# Patient Record
Sex: Female | Born: 1970
Health system: Southern US, Community
[De-identification: ages and names within clinical notes are randomized; demographics above are authoritative.]

## PROBLEM LIST (undated history)

## (undated) DIAGNOSIS — F410 Panic disorder [episodic paroxysmal anxiety] without agoraphobia: Secondary | ICD-10-CM

## (undated) DIAGNOSIS — R229 Localized swelling, mass and lump, unspecified: Secondary | ICD-10-CM

## (undated) DIAGNOSIS — K219 Gastro-esophageal reflux disease without esophagitis: Secondary | ICD-10-CM

## (undated) DIAGNOSIS — F419 Anxiety disorder, unspecified: Secondary | ICD-10-CM

## (undated) DIAGNOSIS — R55 Syncope and collapse: Secondary | ICD-10-CM

## (undated) DIAGNOSIS — E7800 Pure hypercholesterolemia, unspecified: Secondary | ICD-10-CM

## (undated) DIAGNOSIS — G4730 Sleep apnea, unspecified: Secondary | ICD-10-CM

## (undated) DIAGNOSIS — IMO0002 Reserved for concepts with insufficient information to code with codable children: Secondary | ICD-10-CM

## (undated) HISTORY — DX: Pure hypercholesterolemia, unspecified: E78.00

## (undated) HISTORY — PX: ABDOMINAL HYSTERECTOMY: SHX81

## (undated) HISTORY — DX: Sleep apnea, unspecified: G47.30

## (undated) HISTORY — DX: Anxiety disorder, unspecified: F41.9

## (undated) HISTORY — PX: WISDOM TOOTH EXTRACTION: SHX21

## (undated) HISTORY — PX: BIOPSY THYROID: PRO38

---
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
## 2008-09-29 ENCOUNTER — Emergency Department (HOSPITAL_BASED_OUTPATIENT_CLINIC_OR_DEPARTMENT_OTHER): Admission: EM | Admit: 2008-09-29 | Discharge: 2008-09-29 | Payer: Self-pay | Admitting: Emergency Medicine

## 2008-09-29 ENCOUNTER — Ambulatory Visit: Payer: Self-pay | Admitting: Diagnostic Radiology

## 2008-11-03 ENCOUNTER — Encounter: Payer: Self-pay | Admitting: Maternal & Fetal Medicine

## 2009-01-20 ENCOUNTER — Inpatient Hospital Stay (HOSPITAL_COMMUNITY): Admission: AD | Admit: 2009-01-20 | Discharge: 2009-01-20 | Payer: Self-pay | Admitting: Obstetrics

## 2009-02-05 ENCOUNTER — Encounter: Payer: Self-pay | Admitting: Obstetrics & Gynecology

## 2009-03-05 ENCOUNTER — Inpatient Hospital Stay (HOSPITAL_COMMUNITY): Admission: AD | Admit: 2009-03-05 | Discharge: 2009-03-05 | Payer: Self-pay | Admitting: Obstetrics & Gynecology

## 2009-03-25 ENCOUNTER — Inpatient Hospital Stay (HOSPITAL_COMMUNITY): Admission: AD | Admit: 2009-03-25 | Discharge: 2009-03-27 | Payer: Self-pay | Admitting: Obstetrics & Gynecology

## 2009-07-01 ENCOUNTER — Ambulatory Visit: Payer: Self-pay | Admitting: Obstetrics & Gynecology

## 2009-11-13 ENCOUNTER — Emergency Department (HOSPITAL_BASED_OUTPATIENT_CLINIC_OR_DEPARTMENT_OTHER): Admission: EM | Admit: 2009-11-13 | Discharge: 2009-11-13 | Payer: Self-pay | Admitting: Emergency Medicine

## 2010-03-16 LAB — RAPID STREP SCREEN (MED CTR MEBANE ONLY): Streptococcus, Group A Screen (Direct): NEGATIVE

## 2010-03-22 LAB — RH IMMUNE GLOBULIN WORKUP (NOT WOMEN'S HOSP)
ABO/RH(D): B NEG
Antibody Screen: NEGATIVE

## 2010-03-29 LAB — CBC
HCT: 33 % — ABNORMAL LOW (ref 36.0–46.0)
HCT: 36.6 % (ref 36.0–46.0)
Hemoglobin: 11 g/dL — ABNORMAL LOW (ref 12.0–15.0)
Hemoglobin: 12.3 g/dL (ref 12.0–15.0)
MCHC: 33.3 g/dL (ref 30.0–36.0)
MCHC: 33.7 g/dL (ref 30.0–36.0)
MCV: 90.9 fL (ref 78.0–100.0)
MCV: 93.1 fL (ref 78.0–100.0)
Platelets: 259 10*3/uL (ref 150–400)
Platelets: 297 10*3/uL (ref 150–400)
RBC: 3.55 MIL/uL — ABNORMAL LOW (ref 3.87–5.11)
RBC: 4.02 MIL/uL (ref 3.87–5.11)
RDW: 15.1 % (ref 11.5–15.5)
RDW: 15.4 % (ref 11.5–15.5)
WBC: 13.3 10*3/uL — ABNORMAL HIGH (ref 4.0–10.5)
WBC: 7.9 10*3/uL (ref 4.0–10.5)

## 2010-03-29 LAB — RH IMMUNE GLOB WKUP(>/=20WKS)(NOT WOMEN'S HOSP): Fetal Screen: NEGATIVE

## 2010-03-29 LAB — RPR: RPR Ser Ql: NONREACTIVE

## 2010-04-09 LAB — DIFFERENTIAL
Basophils Absolute: 0 10*3/uL (ref 0.0–0.1)
Basophils Relative: 0 % (ref 0–1)
Eosinophils Absolute: 0.2 10*3/uL (ref 0.0–0.7)
Eosinophils Relative: 3 % (ref 0–5)
Lymphocytes Relative: 24 % (ref 12–46)
Lymphs Abs: 1.7 10*3/uL (ref 0.7–4.0)
Monocytes Absolute: 0.7 10*3/uL (ref 0.1–1.0)
Monocytes Relative: 10 % (ref 3–12)
Neutro Abs: 4.3 10*3/uL (ref 1.7–7.7)
Neutrophils Relative %: 62 % (ref 43–77)

## 2010-04-09 LAB — BASIC METABOLIC PANEL
BUN: 5 mg/dL — ABNORMAL LOW (ref 6–23)
CO2: 26 mEq/L (ref 19–32)
Calcium: 10 mg/dL (ref 8.4–10.5)
Chloride: 102 mEq/L (ref 96–112)
Creatinine, Ser: 0.5 mg/dL (ref 0.4–1.2)
GFR calc Af Amer: >60 mL/min (ref >60)
GFR calc non Af Amer: >60 mL/min (ref >60)
Glucose, Bld: 82 mg/dL (ref 70–99)
Potassium: 3.3 mEq/L — ABNORMAL LOW (ref 3.5–5.1)
Sodium: 137 mEq/L (ref 135–145)

## 2010-04-09 LAB — CBC
HCT: 38.6 % (ref 36.0–46.0)
Hemoglobin: 12.9 g/dL (ref 12.0–15.0)
MCHC: 33.6 g/dL (ref 30.0–36.0)
MCV: 92 fL (ref 78.0–100.0)
Platelets: 324 10*3/uL (ref 150–400)
RBC: 4.19 MIL/uL (ref 3.87–5.11)
RDW: 13.7 % (ref 11.5–15.5)
WBC: 6.9 10*3/uL (ref 4.0–10.5)

## 2010-04-09 LAB — D-DIMER, QUANTITATIVE: D-Dimer, Quant: 0.27 ug/mL-FEU (ref 0.00–0.48)

## 2011-02-21 ENCOUNTER — Ambulatory Visit: Payer: Self-pay | Admitting: Obstetrics & Gynecology

## 2011-03-01 ENCOUNTER — Ambulatory Visit: Payer: Self-pay | Admitting: Obstetrics & Gynecology

## 2011-03-16 ENCOUNTER — Other Ambulatory Visit: Payer: Self-pay | Admitting: Obstetrics & Gynecology

## 2011-03-21 IMAGING — CR DG CHEST 2V
2 series · 2 of 2 positions shown · non-contrast
Comparison: None

CLINICAL DATA: Shortness of breath

CHEST - 2 VIEW

[w chest pa]
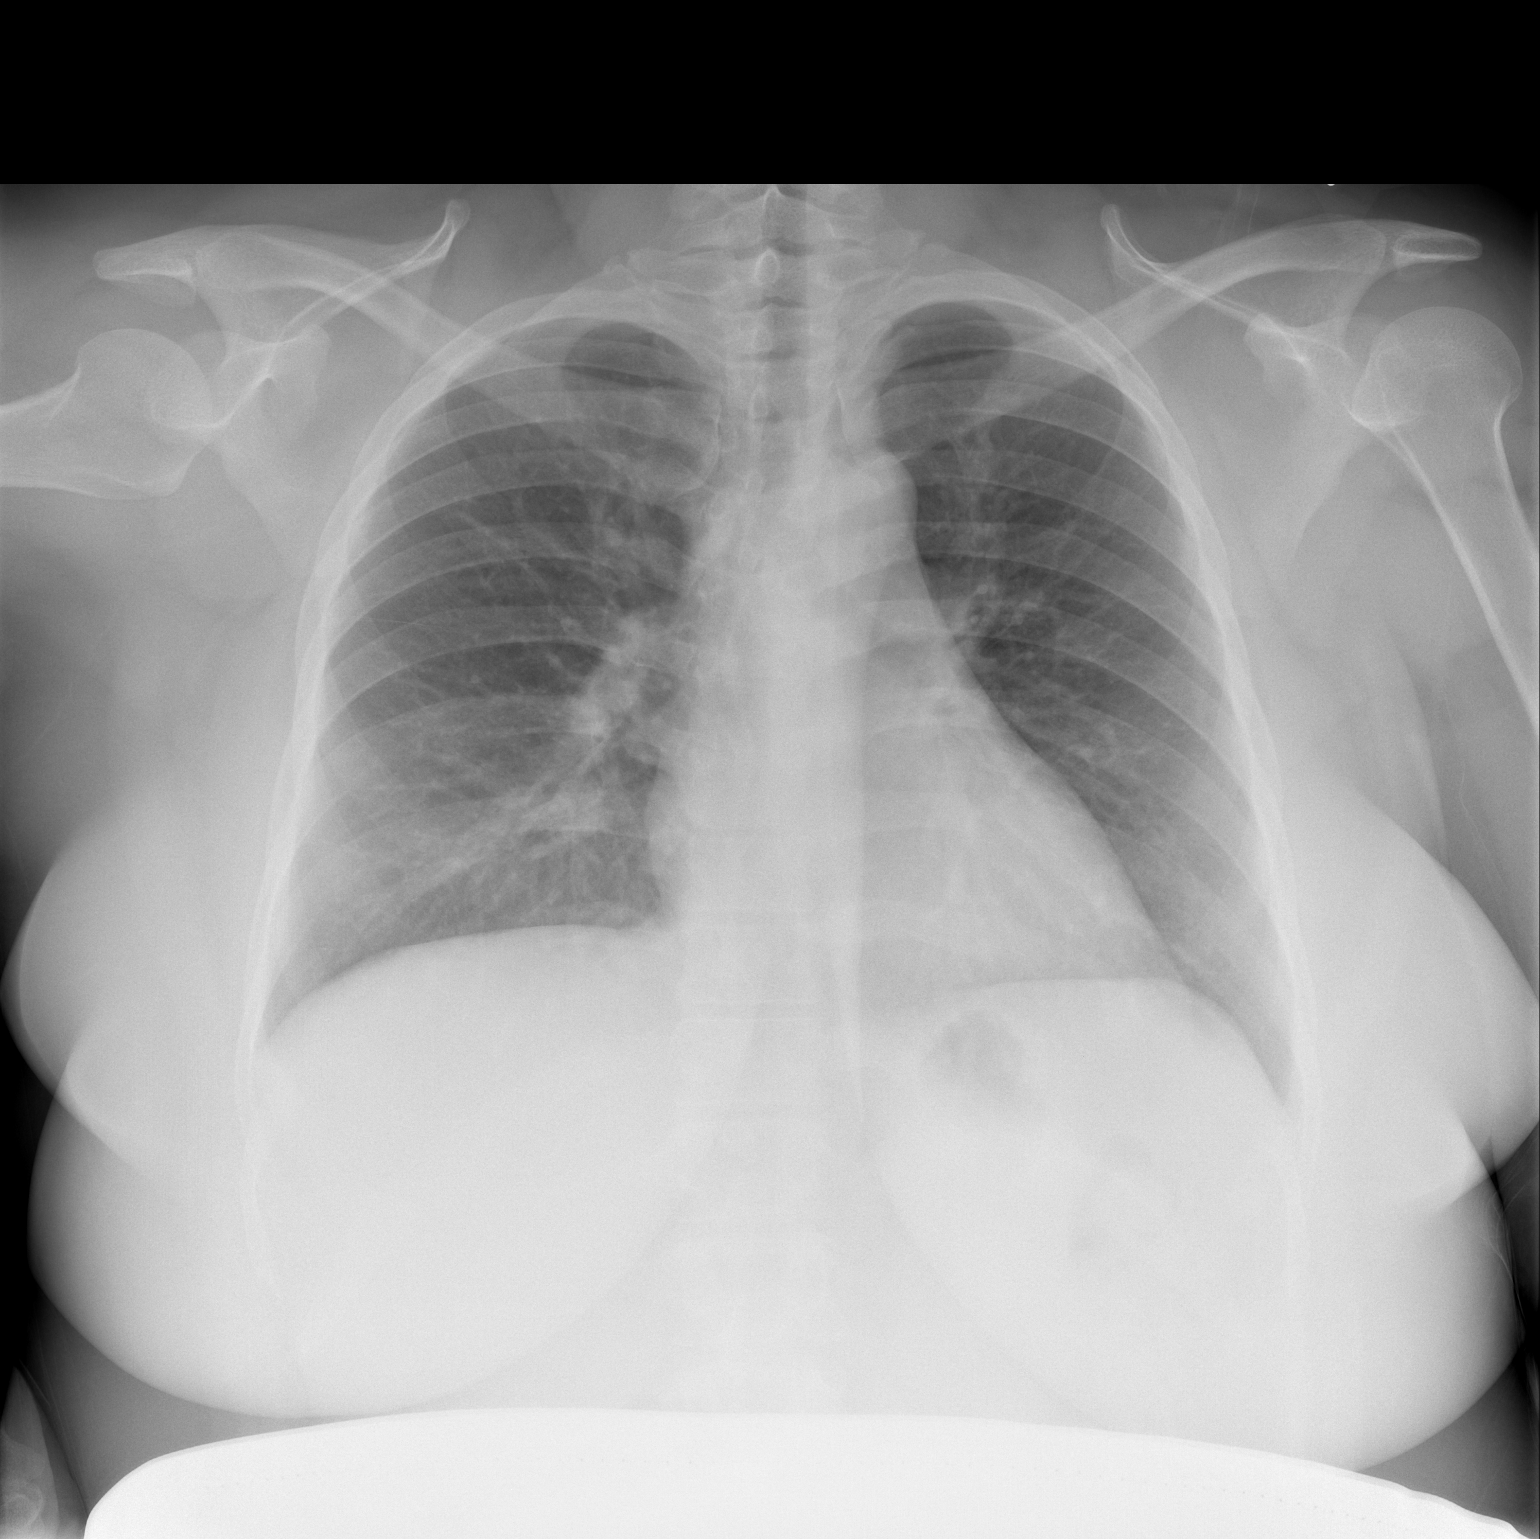

[w chest lat]
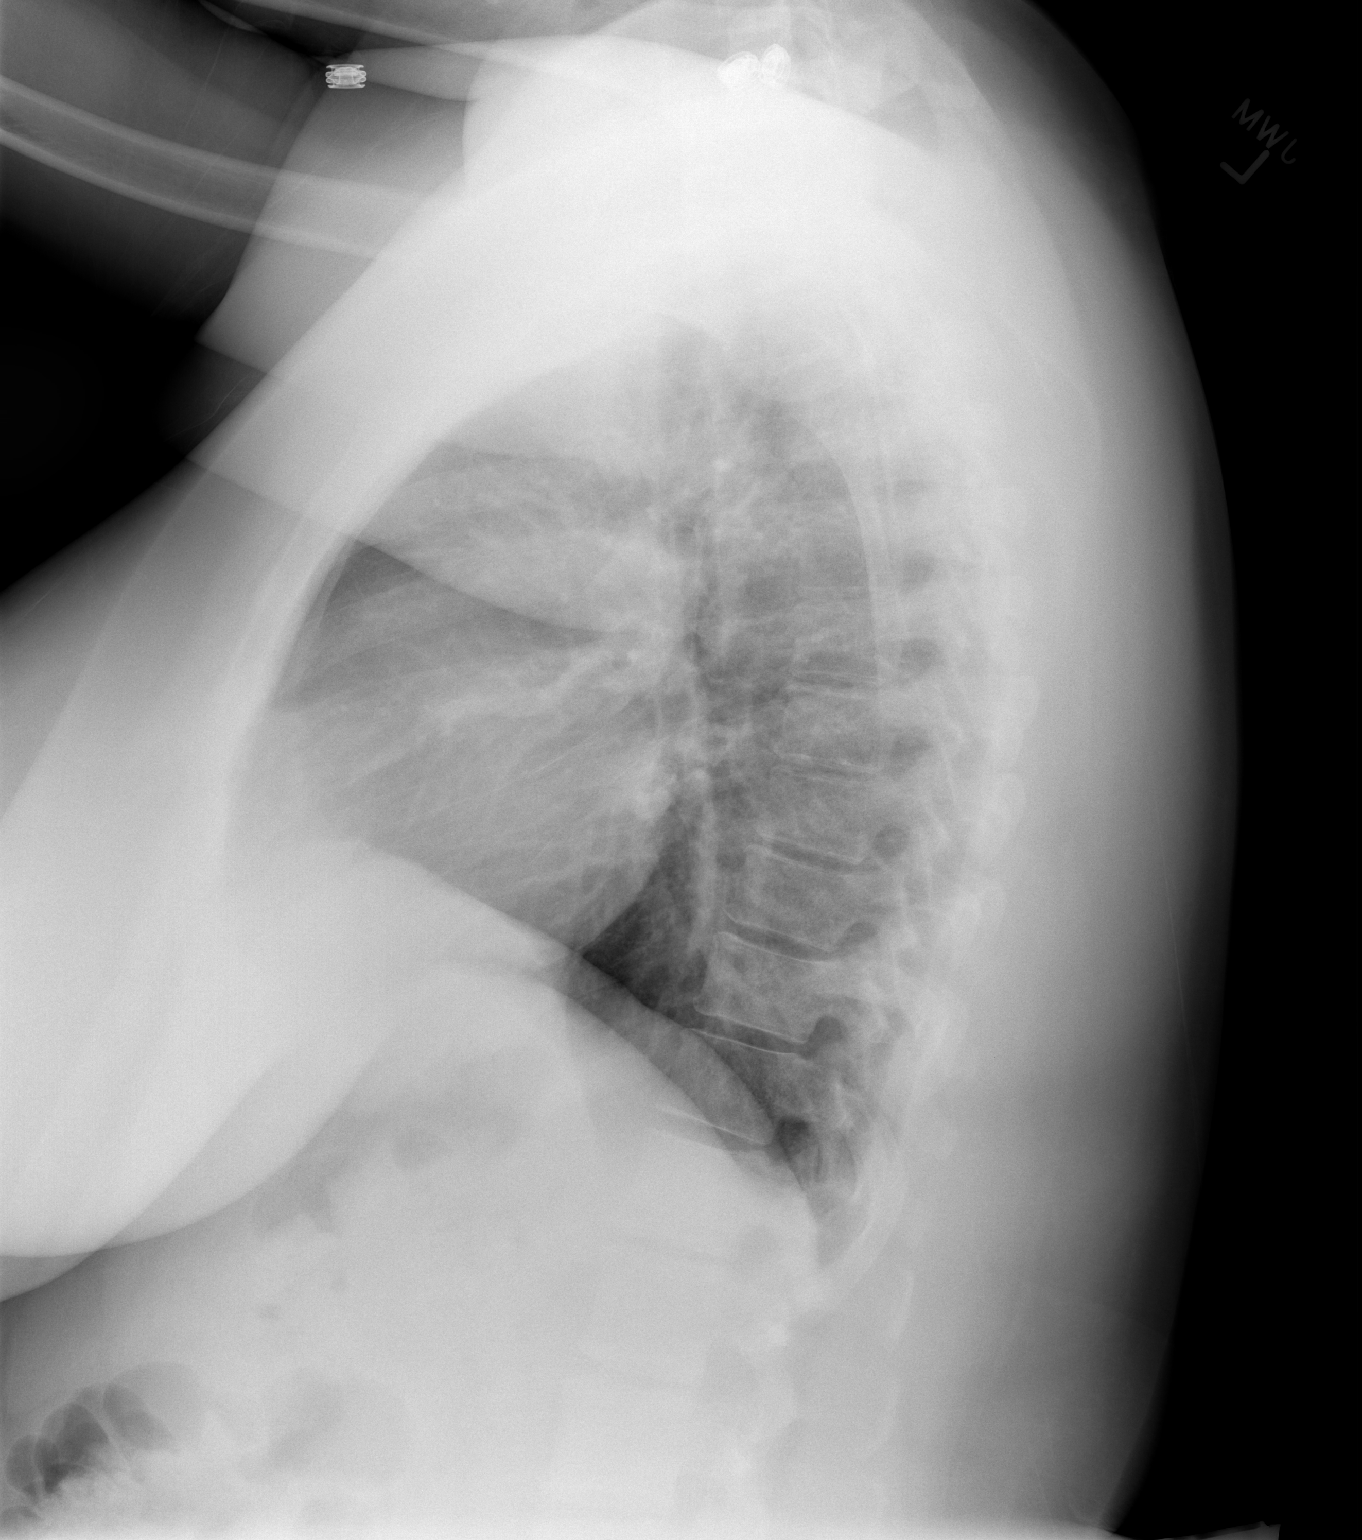

[2 of 2 positions shown; findings below may reference images not displayed]

FINDINGS: The heart size and mediastinal contours are within normal limits.
Both lungs are clear.  The visualized skeletal structures are
unremarkable.
IMPRESSION: No active cardiopulmonary disease.

## 2011-06-14 ENCOUNTER — Encounter (HOSPITAL_COMMUNITY): Payer: Self-pay | Admitting: Pharmacy Technician

## 2011-06-15 ENCOUNTER — Encounter (HOSPITAL_COMMUNITY)
Admission: RE | Admit: 2011-06-15 | Discharge: 2011-06-15 | Disposition: A | Discharge status: Home / Self Care | Source: Ambulatory Visit | Attending: Obstetrics & Gynecology | Admitting: Obstetrics & Gynecology

## 2011-06-15 ENCOUNTER — Other Ambulatory Visit: Payer: Self-pay | Admitting: Obstetrics & Gynecology

## 2011-06-15 ENCOUNTER — Encounter (HOSPITAL_COMMUNITY): Payer: Self-pay

## 2011-06-15 DIAGNOSIS — N9489 Other specified conditions associated with female genital organs and menstrual cycle: Secondary | ICD-10-CM | POA: Diagnosis present

## 2011-06-15 DIAGNOSIS — N939 Abnormal uterine and vaginal bleeding, unspecified: Secondary | ICD-10-CM | POA: Diagnosis present

## 2011-06-15 HISTORY — DX: Reserved for concepts with insufficient information to code with codable children: IMO0002

## 2011-06-15 HISTORY — DX: Localized swelling, mass and lump, unspecified: R22.9

## 2011-06-15 HISTORY — DX: Gastro-esophageal reflux disease without esophagitis: K21.9

## 2011-06-15 HISTORY — DX: Syncope and collapse: R55

## 2011-06-15 LAB — CBC
HCT: 37.8 % (ref 36.0–46.0)
Hemoglobin: 12.5 g/dL (ref 12.0–15.0)
MCH: 29.9 pg (ref 26.0–34.0)
MCHC: 33.1 g/dL (ref 30.0–36.0)
MCV: 90.4 fL (ref 78.0–100.0)
Platelets: 321 10*3/uL (ref 150–400)
RBC: 4.18 MIL/uL (ref 3.87–5.11)
RDW: 12.4 % (ref 11.5–15.5)
WBC: 5.6 10*3/uL (ref 4.0–10.5)

## 2011-06-15 LAB — HCG, SERUM, QUALITATIVE: Preg, Serum: NEGATIVE

## 2011-06-15 LAB — SURGICAL PCR SCREEN
MRSA, PCR: NEGATIVE
Staphylococcus aureus: NEGATIVE

## 2011-06-15 NOTE — Pre-Procedure Instructions (Signed)
CBC, SERUM PREGNANCY TEST, EKG WERE DONE TODAY AT [facility] PREOP. PREOP INSTRUCTIONS DISCUSSED WITH PT USING TEACH BACK METHOD.

## 2011-06-15 NOTE — Patient Instructions (Signed)
YOUR SURGERY IS SCHEDULED ON:  Friday 6/14  AT 7:30 AM  REPORT TO [facility] [place] [facility] AT:  5:30 AM      PHONE # FOR SHORT STAY IS [phone]  DO NOT EAT OR DRINK ANYTHING AFTER MIDNIGHT THE NIGHT BEFORE YOUR SURGERY.  YOU MAY BRUSH YOUR TEETH, RINSE OUT YOUR MOUTH--BUT NO WATER, NO FOOD, NO CHEWING GUM, NO MINTS, NO CANDIES, NO CHEWING TOBACCO.  PLEASE TAKE THE FOLLOWING MEDICATIONS THE AM OF YOUR SURGERY WITH A FEW SIPS OF WATER:  NEXIUM    IF YOU USE INHALERS--USE YOUR INHALERS THE AM OF YOUR SURGERY AND BRING INHALERS TO THE HOSPITAL -TAKE TO SURGERY.    IF YOU ARE DIABETIC:  DO NOT TAKE ANY DIABETIC MEDICATIONS THE AM OF YOUR SURGERY.  IF YOU TAKE INSULIN IN THE EVENINGS--PLEASE ONLY TAKE 1/2 NORMAL EVENING DOSE THE NIGHT BEFORE YOUR SURGERY.  NO INSULIN THE AM OF YOUR SURGERY.  IF YOU HAVE SLEEP APNEA AND USE CPAP OR BIPAP--PLEASE BRING THE MASK --NOT THE MACHINE-NOT THE TUBING   -JUST THE MASK. DO NOT BRING VALUABLES, MONEY, CREDIT CARDS.  CONTACT LENS, DENTURES / PARTIALS, GLASSES SHOULD NOT BE WORN TO SURGERY AND IN MOST CASES-HEARING AIDS WILL NEED TO BE REMOVED.  BRING YOUR GLASSES CASE, ANY EQUIPMENT NEEDED FOR YOUR CONTACT LENS. FOR PATIENTS ADMITTED TO THE HOSPITAL--CHECK OUT TIME THE DAY OF DISCHARGE IS 11:00 AM.  ALL INPATIENT ROOMS ARE PRIVATE - WITH BATHROOM, TELEPHONE, TELEVISION AND WIFI INTERNET. IF YOU ARE BEING DISCHARGED THE SAME DAY OF YOUR SURGERY--YOU CAN NOT DRIVE YOURSELF HOME--AND SHOULD NOT GO HOME ALONE BY TAXI OR BUS.  NO DRIVING OR OPERATING MACHINERY FOR 24 HOURS FOLLOWING ANESTHESIA / PAIN MEDICATIONS.                            SPECIAL INSTRUCTIONS:  CHLORHEXIDINE SOAP SHOWER (other brand names are Betasept and Hibiclens ) PLEASE SHOWER WITH CHLORHEXIDINE THE NIGHT BEFORE YOUR SURGERY AND THE AM OF YOUR SURGERY. DO NOT USE CHLORHEXIDINE ON YOUR FACE OR PRIVATE AREAS--YOU MAY USE YOUR NORMAL SOAP THOSE AREAS AND YOUR NORMAL SHAMPOO.  WOMEN SHOULD  AVOID SHAVING UNDER ARMS AND SHAVING LEGS 48 HOURS BEFORE USING CHLORHEXIDINE TO AVOID SKIN IRRITATION.  DO NOT USE IF ALLERGIC TO CHLORHEXIDINE.  PLEASE READ OVER ANY  FACT SHEETS THAT YOU WERE GIVEN: MRSA INFORMATION

## 2011-06-15 NOTE — H&P (Signed)
  Subjective:  Margaret Davis is a 41 y.o. gravida 3 para 2, female.  She presents for a TRH/LSO.  The patient presented with pelvic pain.  W/u included a pelvic U/S and MRI which demonstrated a 3 cm, solid left-sided adnexal mass felt to be ovarian in origin.  OVA1 testing was normal.  The patient gives a long h/o heavy bleeding with menses.  Pertinent Gyn History:  Menses flow is moderate Bleeding: see above Contraception: OCP (estrogen/progesterone) Last pap: normal   Patient Active Problem List   Diagnosis Date Noted  . Adnexal mass 06/15/2011  . Abnormal uterine bleeding 06/15/2011   Past Medical History  Diagnosis Date  . Syncope     PT STATES HER HEART AND BRAIN "MISFIRE" IF OVERLY HEATED OR UPSET--AND PT WOULD PASS OUT --NO EPISODES SINCE 2000  - NO MEDICATIONS--PT NO LONGER SEES CARDIOLOGIST  . GERD (gastroesophageal reflux disease)     OCCASIONAL  . Mass     RIGHT OVARY  --ABDOMINAL PAIN    Past Surgical History  Procedure Date  . Wisdom tooth extraction     No prescriptions prior to admission   Allergies  Allergen Reactions  . Latex Hives    CONTACT WITH LATEX CAUSES HIVES  . Other     CAN'T EAT ORANGES-BUT CAN DRINK OJ  . Phenobarbital     AS ACHILD--THOUGHT TO BE HAVING SEIZURES--HAD ALLERGIC REACTION TO PHENOBARBITAL.   LATER TOLD SHE DID NOT HAVE SEIZURES    History  Substance Use Topics  . Smoking status: Never Smoker   . Smokeless tobacco: Never Used  . Alcohol Use: Yes     RARELY    No family history on file.   Review of Systems Pertinent items are noted in HPI.    Objective:   Vital signs in last 24 hours: Temp:  [98 F (36.7 C)] 98 F (36.7 C) (06/12 1115) Pulse Rate:  [75] 75  (06/12 1115) Resp:  [16] 16  (06/12 1115) BP: (123)/(80) 123/80 mmHg (06/12 1115) SpO2:  [99 %] 99 % (06/12 1115) Weight:  [98.884 kg (218 lb)] 98.884 kg (218 lb) (06/12 1115)  General:   alert  Skin:   normal  HEENT:  PERRLA  Lungs:   clear to  auscultation bilaterally  Heart:   regular rate and rhythm, S1, S2 normal, no murmur, click, rub or gallop  Breasts:   normal without suspicious masses, skin or nipple changes or axillary nodes  Abdomen:  soft, non-tender; bowel sounds normal; no masses,  no organomegaly  Pelvis:  Vulva and vagina appear normal. Bimanual exam reveals normal uterus and adnexa.                                     Assessment/Plan: Left-sided, adnexal mass, ovarian origin.  Pelvic pain.  Abnormal uterine bleeding with heavy menses.   Will proceed with a TRH/LSO.  Procedure, risks, reasons, benefits and complications (including injury to bowel, bladder, major blood vessel, ureter, bleeding, possibility of transfusion, infection, or fistula formation) were reviewed in detail. Consent was signed and preop testing was ordered.  Instructions were reviewed, including NPO after midnight.

## 2011-06-17 ENCOUNTER — Ambulatory Visit (HOSPITAL_COMMUNITY): Admitting: Anesthesiology

## 2011-06-17 ENCOUNTER — Encounter (HOSPITAL_COMMUNITY): Payer: Self-pay | Admitting: Anesthesiology

## 2011-06-17 ENCOUNTER — Encounter (HOSPITAL_COMMUNITY)
Admission: RE | Disposition: A | Discharge status: Home / Self Care | Payer: Self-pay | Source: Ambulatory Visit | Attending: Obstetrics & Gynecology

## 2011-06-17 ENCOUNTER — Ambulatory Visit (HOSPITAL_COMMUNITY)
Admission: RE | Admit: 2011-06-17 | Discharge: 2011-06-18 | Disposition: A | Discharge status: Home / Self Care | Source: Ambulatory Visit | Attending: Obstetrics & Gynecology | Admitting: Obstetrics & Gynecology

## 2011-06-17 ENCOUNTER — Encounter (HOSPITAL_COMMUNITY): Payer: Self-pay | Admitting: *Deleted

## 2011-06-17 DIAGNOSIS — N9489 Other specified conditions associated with female genital organs and menstrual cycle: Secondary | ICD-10-CM | POA: Diagnosis present

## 2011-06-17 DIAGNOSIS — Z01810 Encounter for preprocedural cardiovascular examination: Secondary | ICD-10-CM | POA: Insufficient documentation

## 2011-06-17 DIAGNOSIS — N838 Other noninflammatory disorders of ovary, fallopian tube and broad ligament: Secondary | ICD-10-CM | POA: Insufficient documentation

## 2011-06-17 DIAGNOSIS — N939 Abnormal uterine and vaginal bleeding, unspecified: Secondary | ICD-10-CM | POA: Diagnosis present

## 2011-06-17 DIAGNOSIS — N926 Irregular menstruation, unspecified: Secondary | ICD-10-CM | POA: Insufficient documentation

## 2011-06-17 DIAGNOSIS — Z01812 Encounter for preprocedural laboratory examination: Secondary | ICD-10-CM | POA: Insufficient documentation

## 2011-06-17 DIAGNOSIS — D259 Leiomyoma of uterus, unspecified: Secondary | ICD-10-CM | POA: Diagnosis present

## 2011-06-17 SURGERY — ROBOTIC ASSISTED TOTAL HYSTERECTOMY
Anesthesia: General | Wound class: Clean Contaminated

## 2011-06-17 MED ORDER — LACTATED RINGERS IV SOLN
INTRAVENOUS | Status: DC | PRN
  Administered 2011-06-17 (×2): via INTRAVENOUS

## 2011-06-17 MED ORDER — KETOROLAC TROMETHAMINE 30 MG/ML IJ SOLN: 30.0000 mg | Freq: Four times a day (QID) | INTRAMUSCULAR | Status: AC

## 2011-06-17 MED ORDER — ONDANSETRON HCL 4 MG PO TABS: 4.0000 mg | ORAL_TABLET | Freq: Four times a day (QID) | ORAL | Status: DC | PRN

## 2011-06-17 MED ORDER — LACTATED RINGERS IV SOLN
INTRAVENOUS | Status: DC
  Administered 2011-06-17: 11:00:00 via INTRAVENOUS

## 2011-06-17 MED ORDER — ALUM & MAG HYDROXIDE-SIMETH 200-200-20 MG/5ML PO SUSP
30.0000 mL | Freq: Four times a day (QID) | ORAL | Status: DC | PRN
  Administered 2011-06-17 – 2011-06-18 (×2): 30 mL via ORAL
  Filled 2011-06-17 (×2): qty 30

## 2011-06-17 MED ORDER — BUPIVACAINE HCL (PF) 0.25 % IJ SOLN
INTRAMUSCULAR | Status: AC
  Filled 2011-06-17: qty 30

## 2011-06-17 MED ORDER — GLYCOPYRROLATE 0.2 MG/ML IJ SOLN
INTRAMUSCULAR | Status: DC | PRN
  Administered 2011-06-17: 0.4 mg via INTRAVENOUS

## 2011-06-17 MED ORDER — DEXAMETHASONE SODIUM PHOSPHATE 4 MG/ML IJ SOLN
INTRAMUSCULAR | Status: DC | PRN
  Administered 2011-06-17: 10 mg via INTRAVENOUS

## 2011-06-17 MED ORDER — MIDAZOLAM HCL 5 MG/5ML IJ SOLN
INTRAMUSCULAR | Status: DC | PRN
  Administered 2011-06-17: 2 mg via INTRAVENOUS

## 2011-06-17 MED ORDER — LIDOCAINE HCL (CARDIAC) 20 MG/ML IV SOLN
INTRAVENOUS | Status: DC | PRN
  Administered 2011-06-17: 50 mg via INTRAVENOUS

## 2011-06-17 MED ORDER — PROMETHAZINE HCL 25 MG/ML IJ SOLN: 6.2500 mg | INTRAMUSCULAR | Status: DC | PRN

## 2011-06-17 MED ORDER — ONDANSETRON HCL 4 MG/2ML IJ SOLN
INTRAMUSCULAR | Status: DC | PRN
  Administered 2011-06-17: 4 mg via INTRAVENOUS

## 2011-06-17 MED ORDER — NEOSTIGMINE METHYLSULFATE 1 MG/ML IJ SOLN
INTRAMUSCULAR | Status: DC | PRN
  Administered 2011-06-17: 3 mg via INTRAVENOUS

## 2011-06-17 MED ORDER — KCL IN DEXTROSE-NACL 20-5-0.45 MEQ/L-%-% IV SOLN
INTRAVENOUS | Status: DC
  Administered 2011-06-17 – 2011-06-18 (×2): via INTRAVENOUS
  Filled 2011-06-17 (×5): qty 1000

## 2011-06-17 MED ORDER — HYDROMORPHONE HCL PF 1 MG/ML IJ SOLN
INTRAMUSCULAR | Status: AC
  Filled 2011-06-17: qty 1

## 2011-06-17 MED ORDER — ROCURONIUM BROMIDE 100 MG/10ML IV SOLN
INTRAVENOUS | Status: DC | PRN
  Administered 2011-06-17: 50 mg via INTRAVENOUS
  Administered 2011-06-17: 10 mg via INTRAVENOUS

## 2011-06-17 MED ORDER — ZOLPIDEM TARTRATE 5 MG PO TABS: 5.0000 mg | ORAL_TABLET | Freq: Every evening | ORAL | Status: DC | PRN

## 2011-06-17 MED ORDER — BUPIVACAINE HCL (PF) 0.25 % IJ SOLN
INTRAMUSCULAR | Status: DC | PRN
  Administered 2011-06-17: 8 mL

## 2011-06-17 MED ORDER — OXYCODONE-ACETAMINOPHEN 5-325 MG PO TABS
1.0000 | ORAL_TABLET | ORAL | Status: DC | PRN
  Administered 2011-06-18: 1 via ORAL
  Administered 2011-06-18: 2 via ORAL
  Filled 2011-06-17: qty 2
  Filled 2011-06-17: qty 1

## 2011-06-17 MED ORDER — MEPERIDINE HCL 50 MG/ML IJ SOLN: 6.2500 mg | INTRAMUSCULAR | Status: DC | PRN

## 2011-06-17 MED ORDER — KETAMINE HCL 10 MG/ML IJ SOLN
INTRAMUSCULAR | Status: DC | PRN
  Administered 2011-06-17: 10 mg via INTRAVENOUS
  Administered 2011-06-17: 20 mg via INTRAVENOUS
  Administered 2011-06-17 (×2): 10 mg via INTRAVENOUS

## 2011-06-17 MED ORDER — MORPHINE SULFATE 2 MG/ML IJ SOLN
2.0000 mg | INTRAMUSCULAR | Status: DC | PRN
  Administered 2011-06-17 – 2011-06-18 (×3): 2 mg via INTRAVENOUS
  Filled 2011-06-17 (×2): qty 1
  Filled 2011-06-17: qty 2

## 2011-06-17 MED ORDER — ACETAMINOPHEN 10 MG/ML IV SOLN
INTRAVENOUS | Status: DC | PRN
  Administered 2011-06-17: 1000 mg via INTRAVENOUS

## 2011-06-17 MED ORDER — PHENYLEPHRINE HCL 10 MG/ML IJ SOLN
INTRAMUSCULAR | Status: DC | PRN
  Administered 2011-06-17 (×3): 40 ug via INTRAVENOUS

## 2011-06-17 MED ORDER — HYDROMORPHONE HCL PF 1 MG/ML IJ SOLN
0.2500 mg | INTRAMUSCULAR | Status: DC | PRN
  Administered 2011-06-17: 0.25 mg via INTRAVENOUS
  Administered 2011-06-17 (×2): 0.5 mg via INTRAVENOUS

## 2011-06-17 MED ORDER — KETOROLAC TROMETHAMINE 30 MG/ML IJ SOLN
INTRAMUSCULAR | Status: AC
  Filled 2011-06-17: qty 1

## 2011-06-17 MED ORDER — STERILE WATER FOR IRRIGATION IR SOLN
Status: DC | PRN
  Administered 2011-06-17: 3000 mL

## 2011-06-17 MED ORDER — KETOROLAC TROMETHAMINE 30 MG/ML IJ SOLN
30.0000 mg | Freq: Four times a day (QID) | INTRAMUSCULAR | Status: AC
  Administered 2011-06-17 (×3): 30 mg via INTRAVENOUS
  Filled 2011-06-17 (×2): qty 1

## 2011-06-17 MED ORDER — CEFAZOLIN SODIUM-DEXTROSE 2-3 GM-% IV SOLR
2.0000 g | INTRAVENOUS | Status: AC
  Administered 2011-06-17: 2 g via INTRAVENOUS

## 2011-06-17 MED ORDER — ONDANSETRON HCL 4 MG/2ML IJ SOLN: 4.0000 mg | Freq: Four times a day (QID) | INTRAMUSCULAR | Status: DC | PRN

## 2011-06-17 MED ORDER — ACETAMINOPHEN 10 MG/ML IV SOLN
INTRAVENOUS | Status: AC
  Filled 2011-06-17: qty 100

## 2011-06-17 MED ORDER — FENTANYL CITRATE 0.05 MG/ML IJ SOLN
INTRAMUSCULAR | Status: DC | PRN
  Administered 2011-06-17 (×10): 50 ug via INTRAVENOUS

## 2011-06-17 MED ORDER — PROPOFOL 10 MG/ML IV EMUL
INTRAVENOUS | Status: DC | PRN
  Administered 2011-06-17: 200 mg via INTRAVENOUS

## 2011-06-17 MED ORDER — METOCLOPRAMIDE HCL 5 MG/ML IJ SOLN
INTRAMUSCULAR | Status: DC | PRN
  Administered 2011-06-17: 10 mg via INTRAVENOUS

## 2011-06-17 MED ORDER — CEFAZOLIN SODIUM-DEXTROSE 2-3 GM-% IV SOLR
INTRAVENOUS | Status: AC
  Filled 2011-06-17: qty 50

## 2011-06-17 MED ORDER — LACTATED RINGERS IR SOLN
Status: DC | PRN
  Administered 2011-06-17: 1000 mL

## 2011-06-17 SURGICAL SUPPLY — 58 items
BAG URINE DRAINAGE (UROLOGICAL SUPPLIES) IMPLANT
BARRIER ADHS 3X4 INTERCEED (GAUZE/BANDAGES/DRESSINGS) IMPLANT
BENZOIN TINCTURE PRP APPL 2/3 (GAUZE/BANDAGES/DRESSINGS) ×2 IMPLANT
BLADELESS LONG 8MM (BLADE) IMPLANT
CABLE HIGH FREQUENCY MONO STRZ (ELECTRODE) ×2 IMPLANT
CATH FOLEY 3WAY  5CC 16FR (CATHETERS)
CATH FOLEY 3WAY 5CC 16FR (CATHETERS) IMPLANT
CATH FOLEY LATEX FREE 16FR (CATHETERS) ×2 IMPLANT
CLOTH BEACON ORANGE TIMEOUT ST (SAFETY) ×2 IMPLANT
CORDS BIPOLAR (ELECTRODE) ×2 IMPLANT
COVER MAYO STAND STRL (DRAPES) IMPLANT
COVER TIP SHEARS 8 DVNC (MISCELLANEOUS) ×1 IMPLANT
COVER TIP SHEARS 8MM DA VINCI (MISCELLANEOUS) ×2
DECANTER SPIKE VIAL GLASS SM (MISCELLANEOUS) IMPLANT
DRAPE CAMERA CLOSED 9X96 (DRAPES) IMPLANT
DRAPE LG THREE QUARTER DISP (DRAPES) ×4 IMPLANT
DRAPE TABLE BACK 44X90 PK DISP (DRAPES) ×4 IMPLANT
DRAPE WARM FLUID 44X44 (DRAPE) ×2 IMPLANT
ELECT REM PT RETURN 9FT ADLT (ELECTROSURGICAL) ×2
ELECTRODE REM PT RTRN 9FT ADLT (ELECTROSURGICAL) ×1 IMPLANT
FILTER SMOKE EVAC (FILTER) IMPLANT
GAUZE VASELINE 3X9 (GAUZE/BANDAGES/DRESSINGS) IMPLANT
GLOVE BIO SURGEON STRL SZ 6.5 (GLOVE) IMPLANT
GLOVE BIO SURGEON STRL SZ8 (GLOVE) IMPLANT
GLOVE BIOGEL PI IND STRL 7.0 (GLOVE) ×4 IMPLANT
GLOVE BIOGEL PI INDICATOR 7.0 (GLOVE) ×4
GLOVE ECLIPSE 6.5 STRL STRAW (GLOVE) IMPLANT
GOWN PREVENTION PLUS LG XLONG (DISPOSABLE) ×4 IMPLANT
GOWN STRL REIN XL XLG (GOWN DISPOSABLE) ×6 IMPLANT
KIT ACCESSORY DA VINCI DISP (KITS)
KIT ACCESSORY DVNC DISP (KITS) IMPLANT
MANIPULATOR UTERINE 4.5 ZUMI (MISCELLANEOUS) ×2 IMPLANT
NEEDLE INSUFFLATION 14GA 120MM (NEEDLE) IMPLANT
NS IRRIG 1000ML POUR BTL (IV SOLUTION) IMPLANT
OCCLUDER COLPOPNEUMO (BALLOONS) ×2 IMPLANT
PACK LAPAROSCOPY W LONG (CUSTOM PROCEDURE TRAY) ×2 IMPLANT
PACK ROBOTIC CUSTOM GYN (CUSTOM PROCEDURE TRAY) ×2 IMPLANT
PLUG CATH AND CAP STER (CATHETERS) IMPLANT
SET IRRIG TUBING LAPAROSCOPIC (IRRIGATION / IRRIGATOR) ×2 IMPLANT
SHEET LAVH (DRAPES) ×2 IMPLANT
SOLUTION ELECTROLUBE (MISCELLANEOUS) ×2 IMPLANT
SPONGE LAP 18X18 X RAY DECT (DISPOSABLE) IMPLANT
STRIP CLOSURE SKIN 1/4X4 (GAUZE/BANDAGES/DRESSINGS) ×2 IMPLANT
SYR 50ML LL SCALE MARK (SYRINGE) ×2 IMPLANT
SYSTEM CONVERTIBLE TROCAR (TROCAR) IMPLANT
TIP UTERINE 5.1X6CM LAV DISP (MISCELLANEOUS) IMPLANT
TIP UTERINE 6.7X10CM GRN DISP (MISCELLANEOUS) IMPLANT
TIP UTERINE 6.7X6CM WHT DISP (MISCELLANEOUS) IMPLANT
TIP UTERINE 6.7X8CM BLUE DISP (MISCELLANEOUS) IMPLANT
TOWEL OR 17X26 10 PK STRL BLUE (TOWEL DISPOSABLE) ×4 IMPLANT
TOWEL OR NON WOVEN STRL DISP B (DISPOSABLE) ×2 IMPLANT
TROCAR 12M 150ML BLUNT (TROCAR) ×2 IMPLANT
TROCAR DISP BLADELESS 8 DVNC (TROCAR) IMPLANT
TROCAR DISP BLADELESS 8MM (TROCAR)
TROCAR Z-THREAD 12X150 (TROCAR) IMPLANT
TROCAR Z-THREAD OPTICAL 5X100M (TROCAR) IMPLANT
TUBING FILTER THERMOFLATOR (ELECTROSURGICAL) IMPLANT
WARMER LAPAROSCOPE (MISCELLANEOUS) IMPLANT

## 2011-06-17 NOTE — Transfer of Care (Signed)
Immediate Anesthesia Transfer of Care Note  Patient: Margaret Davis  Procedure(s) Performed: Procedure(s) (LRB): ROBOTIC ASSISTED TOTAL HYSTERECTOMY (N/A)  Patient Location: PACU  Anesthesia Type: General  Level of Consciousness: awake and patient cooperative  Airway & Oxygen Therapy: Patient Spontanous Breathing and Patient connected to face mask oxygen  Post-op Assessment: Report given to PACU RN, Post -op Vital signs reviewed and stable and Patient moving all extremities  Post vital signs: Reviewed and stable  Complications: No apparent anesthesia complications

## 2011-06-17 NOTE — Anesthesia Preprocedure Evaluation (Addendum)
Anesthesia Evaluation  Patient identified by MRN, date of birth, ID band Patient awake    Reviewed: Allergy & Precautions, H&P , NPO status , Patient's Chart, lab work & pertinent test results  Airway Mallampati: I TM Distance: >3 FB Neck ROM: Full    Dental No notable dental hx.    Pulmonary neg pulmonary ROS,  breath sounds clear to auscultation  Pulmonary exam normal       Cardiovascular negative cardio ROS  Rhythm:Regular Rate:Normal     Neuro/Psych negative neurological ROS  negative psych ROS   GI/Hepatic negative GI ROS, Neg liver ROS, GERD-  Medicated and Controlled,  Endo/Other  negative endocrine ROSMorbid obesity  Renal/GU negative Renal ROS  negative genitourinary   Musculoskeletal negative musculoskeletal ROS (+)   Abdominal   Peds negative pediatric ROS (+)  Hematology negative hematology ROS (+)   Anesthesia Other Findings   Reproductive/Obstetrics negative OB ROS                           Anesthesia Physical Anesthesia Plan  ASA: II  Anesthesia Plan: General   Post-op Pain Management:    Induction: Intravenous  Airway Management Planned: Oral ETT  Additional Equipment:   Intra-op Plan:   Post-operative Plan: Extubation in OR  Informed Consent: I have reviewed the patients History and Physical, chart, labs and discussed the procedure including the risks, benefits and alternatives for the proposed anesthesia with the patient or authorized representative who has indicated his/her understanding and acceptance.   Dental advisory given  Plan Discussed with: CRNA  Anesthesia Plan Comments:         Anesthesia Quick Evaluation

## 2011-06-17 NOTE — Op Note (Signed)
Pre-operative Diagnosis: abnormal uterine bleeding and adnexal mass  Post-operative Diagnosis: abnormal uterine bleeding and fibroids  Operation: Robotic-assisted hysterectomy with bilateral salpingectomy  Surgeon: Roseanna Rainbow  Assistant: Coral Ceo, MD  Anesthesia: GET  Urine Output: per Anesthesiology  Findings: Pedunculated myoma arising from posterior, lower, left uterine serosa--approximately 3 cm in diameter.  Normal ovaries, tubes.  Estimated Blood Loss:  less than 100 mL                 Total IV Fluids: per Anesthesiology         Specimens: PATHOLOGY               Complications:  None; patient tolerated the procedure well.         Disposition: PACU - hemodynamically stable.         Condition: Stable    Procedure Details  The patient was seen in the Holding Room. The risks, benefits, complications, treatment options, and expected outcomes were discussed with the patient.  The patient concurred with the proposed plan, giving informed consent.  The site of surgery properly noted/marked. The patient was identified as Margaret Davis and the procedure verified as a Robotic-assisted hysterectomy with LSO. A Time Out was held and the above information confirmed.  After induction of anesthesia, the patient was draped and prepped in the usual sterile manner. Pt was placed in supine position after anesthesia and draped and prepped in the usual sterile manner. The abdominal drape was placed after the CholoraPrep had been allowed to dry for 3 minutes.  Her arms were tucked to her side with all appropriate precautions.  The shoulder blocks were placed in the usual fashion.  The patient was placed in the semi-lithotomy position in [place] stirrups.  The perineum was prepped with Betadine.  Foley catheter was placed.  A sterile speculum was placed in the vagina.  The cervix was grasped with a single-tooth tenaculum and dilated with Shawnie Pons dilators.  The ZUMI uterine  manipulator with a medium colpotomizer ring was placed without difficulty.  A pneum occluder balloon was placed over the manipulator.  A second time-out was performed.  OG tube placement was confirmed and to suction.  Approximately 2 cm below the costal margin, in the midclavicular line the skin was anesthestized with 0.25% Marcaine.  A 5 mm incision was made and using a 5 mm Optiview, a 5 mm trocar was placed under direct vision.  The patient's abdomen was insufflated with CO2 gas.  At this point and all points during the procedure, the patient's intra-abdominal pressure did not exceed 15 mmHg.  A 10-12 camera port was place 24 cm above the pubic symphysis.  Bilateral 8 mm ports were place 10 cm and 15 degrees inferior.  All ports were placed under direct visualization.  The 5 mm port was removed.  The incision was extended to accommodate a 10 mm trocar.  A 10 mm trocar and sleeve were advanced under direct visualization.   The robot was docked in the usual fashion.  The round ligament on the right was transected with monopolar cautery.  The anterior and posterior leaves of the broad ligament were opened.  The ureter was identified.  A window was made between the infundibulopelvic ligament and the ureter.  The utero-ovarian ligament/fallopian tube was coagulated with bipolar cautery and transected.  The uterine vessels were skeletonized to the level of the Koh ring.  A C-loop was created in the usual fashion.  A similar procedure was  performed on the patient's left side. The round ligament on the left was transected with monopolar cautery.  The anterior and posterior leaves of the broad ligament were opened.  The ureter was identified.  A window was made between the infundibulopelvic ligament and the ureter.  The  utero-ovarian ligament/fallopian tube was coagulated with bipolar cautery and transected.  The uterine vessels were skeletonized to the level of the Koh ring.  A C-loop was created in the usual fashion.    The bladder flap was completed.  At this time, the pneum occluder balloon was insufflated and a colpotomy was performed.  The uterus and cervix  were delivered through the vagina.  All pedicles were inspected under low intraabdominal pressures and noted to be hemostatic.  The vagina cuff was closed using 0-Vicryl on a CT-1 needle in a running fashion.  The mesosalpinx on the right was coagulated with bipolar cautery and transected.  The tube was removed through the assistant port.  The left fallopian tube was manipulated in a similar fashion.  The abdomen and pelvis were copiously irrigated.  The needle was removed without difficulty.  The instruments were then removed under direct visualization and the robotic ports removed.  The robot was undocked.  Deep, subcutaneous, figure-of-eight 0-Vicryl sutures on a UR-6 needle were placed in the 10-12 mm supraumbilical and infracostal incisions.  All skin incisions were closed in a subcuticular fashion using 3-0 Monocryl. Dermabond was applied.  The vagina was swabbed with minimal bleeding noted.   All instrument and needle counts were correct x  2.

## 2011-06-17 NOTE — Preoperative (Signed)
Beta Blockers   Reason not to administer Beta Blockers:Not Applicable, not on home BB 

## 2011-06-17 NOTE — H&P (Signed)
Please note an error in the diagnosis in the interval H&P note.  It should read left adnexal mass.  This is an error in the history section as well.

## 2011-06-17 NOTE — Anesthesia Postprocedure Evaluation (Signed)
  Anesthesia Post-op Note  Patient: Margaret Davis  Procedure(s) Performed: Procedure(s) (LRB): ROBOTIC ASSISTED TOTAL HYSTERECTOMY (N/A)  Patient Location: PACU  Anesthesia Type: General  Level of Consciousness: awake and alert   Airway and Oxygen Therapy: Patient Spontanous Breathing  Post-op Pain: mild  Post-op Assessment: Post-op Vital signs reviewed, Patient's Cardiovascular Status Stable, Respiratory Function Stable, Patent Airway and No signs of Nausea or vomiting  Post-op Vital Signs: stable  Complications: No apparent anesthesia complications

## 2011-06-17 NOTE — Interval H&P Note (Signed)
History and Physical Interval Note:  06/17/2011 7:17 AM  Margaret Davis  has presented today for surgery, with the diagnosis of RIGHT- ADNEXAL MASS/ Abnormal Uterine Bleeding   The various methods of treatment have been discussed with the patient and family. After consideration of risks, benefits and other options for treatment, the patient has consented to  Procedure(s) (LRB): ROBOTIC ASSISTED TOTAL HYSTERECTOMY (N/A) SALPINGO OOPHERECTOMY (N/A) as a surgical intervention .  The patients' history has been reviewed, patient examined, no change in status, stable for surgery.  I have reviewed the patients' chart and labs.  Questions were answered to the patient's satisfaction.     JACKSON-MOORE,[name]

## 2011-06-18 DIAGNOSIS — D259 Leiomyoma of uterus, unspecified: Secondary | ICD-10-CM | POA: Diagnosis present

## 2011-06-18 LAB — BASIC METABOLIC PANEL
BUN: 6 mg/dL (ref 6–23)
CO2: 23 mEq/L (ref 19–32)
Calcium: 9 mg/dL (ref 8.4–10.5)
Chloride: 105 mEq/L (ref 96–112)
Creatinine, Ser: 0.62 mg/dL (ref 0.50–1.10)
GFR calc Af Amer: >90 mL/min (ref >90)
GFR calc non Af Amer: >90 mL/min (ref >90)
Glucose, Bld: 127 mg/dL — ABNORMAL HIGH (ref 70–99)
Potassium: 3.7 mEq/L (ref 3.5–5.1)
Sodium: 137 mEq/L (ref 135–145)

## 2011-06-18 LAB — CBC
HCT: 37.6 % (ref 36.0–46.0)
Hemoglobin: 12.2 g/dL (ref 12.0–15.0)
MCH: 29.7 pg (ref 26.0–34.0)
MCHC: 32.4 g/dL (ref 30.0–36.0)
MCV: 91.5 fL (ref 78.0–100.0)
Platelets: 321 10*3/uL (ref 150–400)
RBC: 4.11 MIL/uL (ref 3.87–5.11)
RDW: 12.7 % (ref 11.5–15.5)
WBC: 11.1 10*3/uL — ABNORMAL HIGH (ref 4.0–10.5)

## 2011-06-18 MED ORDER — OXYCODONE-ACETAMINOPHEN 5-325 MG PO TABS: 1.0000 | ORAL_TABLET | Freq: Four times a day (QID) | ORAL | Status: AC | PRN

## 2011-06-18 MED ORDER — OXYCODONE-ACETAMINOPHEN 5-325 MG PO TABS: 1.0000 | ORAL_TABLET | ORAL | Status: DC | PRN

## 2011-06-18 NOTE — Discharge Summary (Signed)
Physician Discharge Summary  Patient ID: Margaret Davis MRN: [id number] DOB/AGE: November 25, 1970 41 y.o.  Admit date: 06/17/2011 Discharge date: 06/18/2011  Admission Diagnoses: Active Problems:  Adnexal mass  Abnormal uterine bleeding  Leiomyoma of uterus, unspecified  Discharge Diagnoses:  Active Problems:  Adnexal mass  Abnormal uterine bleeding  Leiomyoma of uterus, unspecified   Discharged Condition: good  Hospital Course: On 06/17/2011, the patient underwent the following: Procedure(s): ROBOTIC ASSISTED TOTAL HYSTERECTOMY.   The postoperative course was uneventful.  She was discharged to home on postoperative day 1 tolerating a regular diet.  Consults: None  Significant Diagnostic Studies: none  Treatments: surgery: see above  Discharge Exam: Blood pressure 124/85, pulse 74, temperature 98.5 F (36.9 C), temperature source Oral, resp. rate 16, height 5\' 5"  (1.651 m), weight 98.884 kg (218 lb), SpO2 98.00%. General appearance: alert GI: soft, non-tender; bowel sounds normal; no masses,  no organomegaly Extremities: extremities normal, atraumatic, no cyanosis or edema Incision/Wound: C/D/I  Disposition: Hone  Discharge Orders    Future Orders Please Complete By Expires   Diet - low sodium heart healthy      Increase activity slowly      May walk up steps      May shower / Bathe      Comments:   No tub baths for 6 weeks   Driving Restrictions      Comments:   No driving for 1 week   Lifting restrictions      Comments:   No lifting > 30 lbs for 6 weeks   Sexual Activity Restrictions      Comments:   No intercourse for  8 weeks   Discharge wound care:      Comments:   Keep clean and dry   Call MD for:  temperature >100.4      Call MD for:  persistant nausea and vomiting      Call MD for:  severe uncontrolled pain      Call MD for:  redness, tenderness, or signs of infection (pain, swelling, redness, odor or green/yellow discharge around incision site)        Call MD for:  persistant dizziness or light-headedness      Call MD for:  extreme fatigue        Medication List  As of 06/18/2011 12:51 PM   STOP taking these medications         LOESTRIN FE 1/20 1-20 MG-MCG tablet         TAKE these medications         cholecalciferol 1000 UNITS tablet   Commonly known as: VITAMIN D   Take 1,000 Units by mouth daily.      co-enzyme Q-10 30 MG capsule   Take 200 mg by mouth daily.      esomeprazole 40 MG capsule   Commonly known as: NEXIUM   Take 40 mg by mouth. ONCE A DAY  IF NEEDED      oxyCODONE-acetaminophen 5-325 MG per tablet   Commonly known as: PERCOCET   Take 1-2 tablets by mouth every 3 (three) hours as needed (moderate to severe pain (when tolerating fluids)).      simvastatin 20 MG tablet   Commonly known as: ZOCOR   Take 20 mg by mouth at bedtime.           Follow-up Information    Follow up with Antionette Char A, MD. Schedule an appointment as soon as possible for a visit in 2 weeks.  Contact information:   [address], [address] [place] [address] [phone]          Signed: Roseanna Rainbow 06/18/2011, 12:51 PM

## 2011-06-18 NOTE — Discharge Instructions (Signed)
Robotic Assisted  Hysterectomy °Care After °Refer to this sheet in the next few weeks. These instructions provide you with information on caring for yourself after your procedure. Your caregiver may also give you more specific instructions. Your treatment has been planned according to current medical practices, but problems sometimes occur. Call your caregiver if you have any problems or questions after your procedure. °HOME CARE INSTRUCTIONS °Healing will take time. You may have discomfort, tenderness, swelling, and bruising at the surgical site for a couple of weeks. This is normal and will get better as time goes on. °· Only take over-the-counter or prescription medicines for pain, discomfort, or fever as directed by your caregiver.  °· Do not take aspirin. It can cause bleeding.  °· Do not drive when taking pain medicine.  °· Follow your caregiver's advice regarding diet, exercise, lifting, driving, and general activities.  °· Resume your usual diet as directed and allowed.  °· Get plenty of rest and sleep.  °· Do not douche, use tampons, or have sexual intercourse for at least 6 weeks, or until your caregiver gives you permission.  °· Change your bandages (dressings) as directed by your caregiver.  °· Monitor your temperature and notify your caregiver of a fever.  °· Take showers instead of baths for 2 to 3 weeks.  °· Do not drink alcohol until your caregiver gives you permission.  °· If you develop constipation, you may take a mild laxative with your caregiver's permission. Bran foods may help with constipation problems. Drinking enough fluids to keep your urine clear or pale yellow may help as well.  °· Try to have someone home with you for 1 or 2 weeks to help around the house.  °· Keep all your follow-up appointments as directed by your caregiver.  °SEEK MEDICIAL CARE IF:  °· You have swelling, redness, or increasing pain in the wound.  °· You have pus coming from the wound.  °· You notice a bad smell  coming from the wound or bandage (dressing).  °· You have swelling, redness, or pain from the intravenous (IV) site.  °· Your wound breaks open.  °· You feel dizzy or lightheaded.  °· You have pain or bleeding when you urinate.  °· You have persistent diarrhea.  °· You have persistent nausea and vomiting.  °· You have abnormal vaginal discharge.  °· You have a rash.  °· You have any type of abnormal reaction or develop an allergy to your medicine.  °· You have poor pain control with your prescribed medicine.  °SEEK IMMEDIATE MEDICIAL CARE IF:  °· You have a fever.  °· You have severe abdominal pain.  °· You have chest pain.  °· You have shortness of breath.  °· You faint.  °· You have pain, swelling, or redness of your leg.  °· You have heavy vaginal bleeding with blood clots.  °MAKE SURE YOU: °· Understand these instructions.  °· Will watch your condition.  °· Will get help right away if you are not doing well or get worse.  °Document Released: 12/09/2010 Document Reviewed: 12/06/2010 °[employer]® Patient Information ©2012 [employer]. °

## 2011-07-04 HISTORY — PX: ABDOMINAL HYSTERECTOMY: SHX81

## 2011-10-11 ENCOUNTER — Ambulatory Visit: Payer: Self-pay | Admitting: Obstetrics & Gynecology

## 2012-06-14 ENCOUNTER — Encounter: Payer: Self-pay | Admitting: Obstetrics & Gynecology

## 2012-06-20 ENCOUNTER — Ambulatory Visit (INDEPENDENT_AMBULATORY_CARE_PROVIDER_SITE_OTHER): Admitting: Obstetrics & Gynecology

## 2012-06-20 VITALS — BP 126/79 | HR 82 | Temp 98.7°F | Wt 228.0 lb

## 2012-06-20 DIAGNOSIS — Z01419 Encounter for gynecological examination (general) (routine) without abnormal findings: Secondary | ICD-10-CM

## 2012-06-20 NOTE — Progress Notes (Signed)
Subjective:     Margaret Davis is a 42 y.o. female here for a routine exam.  Current complaints: here today for 1 year follow up to a hysterectomy.   Personal health questionnaire reviewed: yes.   Gynecologic History No LMP recorded. Contraception: status post hysterectomy  Obstetric History OB History   Grav Para Term Preterm Abortions TAB SAB Ect Mult Living                   The following portions of the patient's history were reviewed and updated as appropriate: allergies, current medications, past family history, past medical history, past social history, past surgical history and problem list.  Review of Systems Pertinent items are noted in HPI.    Objective:    General appearance: alert Breasts: normal appearance, no masses or tenderness Abdomen: soft, non-tender; bowel sounds normal; no masses,  no organomegaly Pelvic:  external genitalia normal, no adnexal masses or tenderness and vagina normal without discharge    Assessment:    Healthy female exam.    Plan:    Return prn

## 2012-06-22 ENCOUNTER — Encounter: Payer: Self-pay | Admitting: Obstetrics & Gynecology

## 2012-06-22 DIAGNOSIS — Z01419 Encounter for gynecological examination (general) (routine) without abnormal findings: Secondary | ICD-10-CM | POA: Insufficient documentation

## 2012-06-22 NOTE — Patient Instructions (Signed)

## 2012-09-06 ENCOUNTER — Emergency Department (HOSPITAL_BASED_OUTPATIENT_CLINIC_OR_DEPARTMENT_OTHER)

## 2012-09-06 ENCOUNTER — Emergency Department (HOSPITAL_BASED_OUTPATIENT_CLINIC_OR_DEPARTMENT_OTHER)
Admission: EM | Admit: 2012-09-06 | Discharge: 2012-09-06 | Disposition: A | Discharge status: Home / Self Care | Attending: Emergency Medicine | Admitting: Emergency Medicine

## 2012-09-06 ENCOUNTER — Encounter (HOSPITAL_BASED_OUTPATIENT_CLINIC_OR_DEPARTMENT_OTHER): Payer: Self-pay | Admitting: *Deleted

## 2012-09-06 DIAGNOSIS — R072 Precordial pain: Secondary | ICD-10-CM | POA: Insufficient documentation

## 2012-09-06 DIAGNOSIS — K219 Gastro-esophageal reflux disease without esophagitis: Secondary | ICD-10-CM | POA: Insufficient documentation

## 2012-09-06 DIAGNOSIS — Z91040 Latex allergy status: Secondary | ICD-10-CM | POA: Insufficient documentation

## 2012-09-06 DIAGNOSIS — R079 Chest pain, unspecified: Secondary | ICD-10-CM

## 2012-09-06 DIAGNOSIS — Z79899 Other long term (current) drug therapy: Secondary | ICD-10-CM | POA: Insufficient documentation

## 2012-09-06 DIAGNOSIS — Z8659 Personal history of other mental and behavioral disorders: Secondary | ICD-10-CM | POA: Insufficient documentation

## 2012-09-06 HISTORY — DX: Panic disorder (episodic paroxysmal anxiety): F41.0

## 2012-09-06 LAB — CBC WITH DIFFERENTIAL/PLATELET
Basophils Absolute: 0 10*3/uL (ref 0.0–0.1)
Basophils Relative: 0 % (ref 0–1)
Eosinophils Absolute: 0.2 10*3/uL (ref 0.0–0.7)
Eosinophils Relative: 4 % (ref 0–5)
HCT: 36.8 % (ref 36.0–46.0)
Hemoglobin: 12.3 g/dL (ref 12.0–15.0)
Lymphocytes Relative: 30 % (ref 12–46)
Lymphs Abs: 1.5 10*3/uL (ref 0.7–4.0)
MCH: 30.4 pg (ref 26.0–34.0)
MCHC: 33.4 g/dL (ref 30.0–36.0)
MCV: 90.9 fL (ref 78.0–100.0)
Monocytes Absolute: 0.5 10*3/uL (ref 0.1–1.0)
Monocytes Relative: 9 % (ref 3–12)
Neutro Abs: 2.9 10*3/uL (ref 1.7–7.7)
Neutrophils Relative %: 57 % (ref 43–77)
Platelets: 311 10*3/uL (ref 150–400)
RBC: 4.05 MIL/uL (ref 3.87–5.11)
RDW: 11.8 % (ref 11.5–15.5)
WBC: 5.2 10*3/uL (ref 4.0–10.5)

## 2012-09-06 LAB — COMPREHENSIVE METABOLIC PANEL
ALT: 19 U/L (ref 0–35)
AST: 18 U/L (ref 0–37)
Albumin: 3.9 g/dL (ref 3.5–5.2)
Alkaline Phosphatase: 77 U/L (ref 39–117)
BUN: 6 mg/dL (ref 6–23)
CO2: 28 mEq/L (ref 19–32)
Calcium: 9.6 mg/dL (ref 8.4–10.5)
Chloride: 105 mEq/L (ref 96–112)
Creatinine, Ser: 0.7 mg/dL (ref 0.50–1.10)
GFR calc Af Amer: >90 mL/min (ref >90)
GFR calc non Af Amer: >90 mL/min (ref >90)
Glucose, Bld: 99 mg/dL (ref 70–99)
Potassium: 3.2 mEq/L — ABNORMAL LOW (ref 3.5–5.1)
Sodium: 142 mEq/L (ref 135–145)
Total Bilirubin: 0.2 mg/dL — ABNORMAL LOW (ref 0.3–1.2)
Total Protein: 7.4 g/dL (ref 6.0–8.3)

## 2012-09-06 LAB — TROPONIN I
Troponin I: <0.3 ng/mL (ref <0.30)
Troponin I: <0.3 ng/mL (ref <0.30)

## 2012-09-06 MED ORDER — RANITIDINE HCL 300 MG PO TABS: 300.0000 mg | ORAL_TABLET | Freq: Every day | ORAL | Status: DC

## 2012-09-06 MED ORDER — ASPIRIN EC 81 MG PO TBEC: 81.0000 mg | DELAYED_RELEASE_TABLET | Freq: Every day | ORAL | Status: DC

## 2012-09-06 MED ORDER — ASPIRIN 81 MG PO CHEW
324.0000 mg | CHEWABLE_TABLET | Freq: Once | ORAL | Status: AC
  Administered 2012-09-06: 324 mg via ORAL
  Filled 2012-09-06: qty 4
  Filled 2012-09-06: qty 1

## 2012-09-06 NOTE — ED Provider Notes (Addendum)
CSN: [id number]     Arrival date & time 09/06/12  1105 History   First MD Initiated Contact with Patient 09/06/12 1206     Chief Complaint  Patient presents with  . Chest Pain   (Consider location/radiation/quality/duration/timing/severity/associated sxs/prior Treatment) HPI Comments: Pt comes in with cc of chest pain. Pt has hx of HL, GERD, no other med problems.  She reports 4 days of mid-sternal chest pain, described more as discomfort, and not pain, that she thought was GERD. She has taken GERD meds, with no relief. Now the discomfort is radiating to the left shoulder and neck. She has no n/v/f/c/cough/dib. No hx of PE, DVT. No family hx of premature CAD, marfan's syndrome.  Patient is a 42 y.o. female presenting with chest pain. The history is provided by the patient.  Chest Pain Associated symptoms: no abdominal pain, no headache, no nausea, no shortness of breath and not vomiting     Past Medical History  Diagnosis Date  . Syncope     PT STATES HER HEART AND BRAIN "MISFIRE" IF OVERLY HEATED OR UPSET--AND PT WOULD PASS OUT --NO EPISODES SINCE 2000  - NO MEDICATIONS--PT NO LONGER SEES CARDIOLOGIST  . GERD (gastroesophageal reflux disease)     OCCASIONAL  . Mass     RIGHT OVARY  --ABDOMINAL PAIN  . Panic attacks    Past Surgical History  Procedure Laterality Date  . Wisdom tooth extraction    . Abdominal hysterectomy     No family history on file. History  Substance Use Topics  . Smoking status: Never Smoker   . Smokeless tobacco: Never Used  . Alcohol Use: Yes     Comment: RARELY   OB History   Grav Para Term Preterm Abortions TAB SAB Ect Mult Living                 Review of Systems  Constitutional: Negative for activity change.  HENT: Negative for neck pain.   Respiratory: Negative for shortness of breath.   Cardiovascular: Positive for chest pain.  Gastrointestinal: Negative for nausea, vomiting and abdominal pain.  Genitourinary: Negative for dysuria.   Neurological: Negative for headaches.    Allergies  Latex; Other; and Phenobarbital  Home Medications   Current Outpatient Rx  Name  Route  Sig  Dispense  Refill  . phentermine 37.5 MG capsule   Oral   Take 37.5 mg by mouth every morning.         . cholecalciferol (VITAMIN D) 1000 UNITS tablet   Oral   Take 1,000 Units by mouth daily.         Marland Kitchen co-enzyme Q-10 30 MG capsule   Oral   Take 200 mg by mouth daily.          Marland Kitchen esomeprazole (NEXIUM) 40 MG capsule   Oral   Take 40 mg by mouth. ONCE A DAY  IF NEEDED         . loratadine (CLARITIN) 10 MG tablet   Oral   Take 10 mg by mouth daily.         . simvastatin (ZOCOR) 20 MG tablet   Oral   Take 20 mg by mouth at bedtime.          BP 138/85  Pulse 76  Temp(Src) 98.6 F (37 C)  Resp 20  Ht 5\' 5"  (1.651 m)  Wt 225 lb (102.059 kg)  BMI 37.44 kg/m2  SpO2 98%  LMP 06/04/2011 Physical Exam  Constitutional: She is  oriented to person, place, and time. She appears well-developed and well-nourished.  HENT:  Head: Normocephalic and atraumatic.  Eyes: EOM are normal. Pupils are equal, round, and reactive to light.  Neck: Neck supple.  Cardiovascular: Normal rate, regular rhythm and normal heart sounds.   No murmur heard. Pulmonary/Chest: Effort normal. No respiratory distress.  Abdominal: Soft. She exhibits no distension. There is no tenderness. There is no rebound and no guarding.  Neurological: She is alert and oriented to person, place, and time.  Skin: Skin is warm and dry.    ED Course  Procedures (including critical care time) Labs Review Labs Reviewed  COMPREHENSIVE METABOLIC PANEL - Abnormal; Notable for the following:    Potassium 3.2 (*)    Total Bilirubin 0.2 (*)    All other components within normal limits  CBC WITH DIFFERENTIAL  TROPONIN I  TROPONIN I   Imaging Review Dg Chest Port 1 View  09/06/2012   *RADIOLOGY REPORT*  Clinical Data: Chest pain, syncopal episode  PORTABLE CHEST - 1  VIEW  Comparison: 09/29/2008  Findings:  Examination is degraded secondary to portable technique and patient body habitus.  Grossly unchanged cardiac silhouette and mediastinal contours. Apparent veilling opacities overlying the bilateral costophrenic angles are favored to be secondary to overlying soft tissues.  No focal airspace opacity.  No definite pleural effusion or pneumothorax.  Grossly unchanged bones.  IMPRESSION: Degraded examination without definite acute cardiopulmonary disease on this AP portable examination.  Further evaluation with a PA and lateral chest radiograph may be obtained as clinically indicated.   Original Report Authenticated By: Tacey Ruiz, MD    MDM  No diagnosis found.   Date: 09/06/2012  Rate: 75  Rhythm: normal sinus rhythm  QRS Axis: normal  Intervals: normal  ST/T Wave abnormalities: normal  Conduction Disutrbances: none  Narrative Interpretation: unremarkable  Differential diagnosis includes: ACS syndrome CHF exacerbation Dissection Pneumothorax Valvular disorder Myocarditis Pericarditis Pericardial effusion Pneumonia Pleural effusion Pulmonary edema PE  Pt comes in with cc of chest pain. Midsternal discomfort, radiating to the left shoulder, neck.  She has 1 cardiac risk factor- HL. Pain is concerning, but it is constant, and with EKG that is WNL, 2 troponins being normal - ACS is still on the ddx, but lower. Spoke with her PCP clinical, and we set up an appointment for 9 am tomorrow, patient is OK with that.  PE considered - but patient has WELLS score of 0 and PERC negative.  Dissection - low pretest value to begin with, given no hx of marfans, htn, smoking, drug use.  She has hx of GERD, and GI etiology is possible, but she states that she has been taking meds for that with no relief.  Unsure what the etiology is, but i feel that Ms. Sparlin can be discharged home with close f/u. She is reliable, and return precautions have been  discussed.   Derwood Kaplan, MD 09/06/12 1610  4:23 PM Return precautions discussed.   Derwood Kaplan, MD 09/06/12 1623

## 2012-09-06 NOTE — ED Notes (Signed)
Patient states she has a history of syncope associated with being overheated.  States four days ago, the [facility] unit in her home went out, and she stayed in the house for several hours while repairs were done.  States she developed some mild chest pressure in her left chest and presumed it was her acid reflux.  States over the last four days, the pressure has increased and now radiates into her left shoulder, neck and jaw.  Denies any syncopal episodes, lightheadedness or other symptoms.

## 2012-09-06 NOTE — Discharge Instructions (Signed)
We saw you in the ER for the chest pain/shortness of breath. All of our cardiac workup is normal, including labs, EKG and chest X-RAY are normal. PLEASE SEE YOUR DOCTOR TOMORROW, 9 AM. PLEASE RETURN TO THE ER IF THE PAIN IS GETTING WORSE, THERE IS NAUSEA, SWEATING, SHORTNESS OF BREATH, DIZZINESS.   Chest Pain (Nonspecific) It is often hard to give a specific diagnosis for the cause of chest pain. There is always a chance that your pain could be related to something serious, such as a heart attack or a blood clot in the lungs. You need to follow up with your caregiver for further evaluation. CAUSES   Heartburn.  Pneumonia or bronchitis.  Anxiety or stress.  Inflammation around your heart (pericarditis) or lung (pleuritis or pleurisy).  A blood clot in the lung.  A collapsed lung (pneumothorax). It can develop suddenly on its own (spontaneous pneumothorax) or from injury (trauma) to the chest.  Shingles infection (herpes zoster virus). The chest wall is composed of bones, muscles, and cartilage. Any of these can be the source of the pain.  The bones can be bruised by injury.  The muscles or cartilage can be strained by coughing or overwork.  The cartilage can be affected by inflammation and become sore (costochondritis). DIAGNOSIS  Lab tests or other studies, such as X-rays, electrocardiography, stress testing, or cardiac imaging, may be needed to find the cause of your pain.  TREATMENT   Treatment depends on what may be causing your chest pain. Treatment may include:  Acid blockers for heartburn.  Anti-inflammatory medicine.  Pain medicine for inflammatory conditions.  Antibiotics if an infection is present.  You may be advised to change lifestyle habits. This includes stopping smoking and avoiding alcohol, caffeine, and chocolate.  You may be advised to keep your head raised (elevated) when sleeping. This reduces the chance of acid going backward from your stomach into  your esophagus.  Most of the time, nonspecific chest pain will improve within 2 to 3 days with rest and mild pain medicine. HOME CARE INSTRUCTIONS   If antibiotics were prescribed, take your antibiotics as directed. Finish them even if you start to feel better.  For the next few days, avoid physical activities that bring on chest pain. Continue physical activities as directed.  Do not smoke.  Avoid drinking alcohol.  Only take over-the-counter or prescription medicine for pain, discomfort, or fever as directed by your caregiver.  Follow your caregiver's suggestions for further testing if your chest pain does not go away.  Keep any follow-up appointments you made. If you do not go to an appointment, you could develop lasting (chronic) problems with pain. If there is any problem keeping an appointment, you must call to reschedule. SEEK MEDICAL CARE IF:   You think you are having problems from the medicine you are taking. Read your medicine instructions carefully.  Your chest pain does not go away, even after treatment.  You develop a rash with blisters on your chest. SEEK IMMEDIATE MEDICAL CARE IF:   You have increased chest pain or pain that spreads to your arm, neck, jaw, back, or abdomen.  You develop shortness of breath, an increasing cough, or you are coughing up blood.  You have severe back or abdominal pain, feel nauseous, or vomit.  You develop severe weakness, fainting, or chills.  You have a fever. THIS IS AN EMERGENCY. Do not wait to see if the pain will go away. Get medical help at once. Call your  local emergency services (911 in [place] Do not drive yourself to the hospital. MAKE SURE YOU:   Understand these instructions.  Will watch your condition.  Will get help right away if you are not doing well or get worse. Document Released: 09/29/2004 Document Revised: 03/14/2011 Document Reviewed: 07/26/2007 [facility] Patient Information 2014 [place], [place].

## 2012-10-15 ENCOUNTER — Emergency Department (HOSPITAL_BASED_OUTPATIENT_CLINIC_OR_DEPARTMENT_OTHER)
Admission: EM | Admit: 2012-10-15 | Discharge: 2012-10-15 | Disposition: A | Discharge status: Home / Self Care | Attending: Emergency Medicine | Admitting: Emergency Medicine

## 2012-10-15 ENCOUNTER — Emergency Department (HOSPITAL_BASED_OUTPATIENT_CLINIC_OR_DEPARTMENT_OTHER)

## 2012-10-15 ENCOUNTER — Encounter (HOSPITAL_BASED_OUTPATIENT_CLINIC_OR_DEPARTMENT_OTHER): Payer: Self-pay | Admitting: Emergency Medicine

## 2012-10-15 DIAGNOSIS — Z8659 Personal history of other mental and behavioral disorders: Secondary | ICD-10-CM | POA: Insufficient documentation

## 2012-10-15 DIAGNOSIS — Y939 Activity, unspecified: Secondary | ICD-10-CM | POA: Insufficient documentation

## 2012-10-15 DIAGNOSIS — Z79899 Other long term (current) drug therapy: Secondary | ICD-10-CM | POA: Insufficient documentation

## 2012-10-15 DIAGNOSIS — Z91040 Latex allergy status: Secondary | ICD-10-CM | POA: Insufficient documentation

## 2012-10-15 DIAGNOSIS — Z7982 Long term (current) use of aspirin: Secondary | ICD-10-CM | POA: Insufficient documentation

## 2012-10-15 DIAGNOSIS — Y9289 Other specified places as the place of occurrence of the external cause: Secondary | ICD-10-CM | POA: Insufficient documentation

## 2012-10-15 DIAGNOSIS — Y990 Civilian activity done for income or pay: Secondary | ICD-10-CM | POA: Insufficient documentation

## 2012-10-15 DIAGNOSIS — S8001XA Contusion of right knee, initial encounter: Secondary | ICD-10-CM

## 2012-10-15 DIAGNOSIS — S8000XA Contusion of unspecified knee, initial encounter: Secondary | ICD-10-CM | POA: Insufficient documentation

## 2012-10-15 DIAGNOSIS — R296 Repeated falls: Secondary | ICD-10-CM | POA: Insufficient documentation

## 2012-10-15 DIAGNOSIS — R55 Syncope and collapse: Secondary | ICD-10-CM | POA: Insufficient documentation

## 2012-10-15 DIAGNOSIS — K219 Gastro-esophageal reflux disease without esophagitis: Secondary | ICD-10-CM | POA: Insufficient documentation

## 2012-10-15 NOTE — ED Notes (Signed)
Pt sts she had syncopal episode Friday while at work on Friday, sts severe knee pain with swelling from syncopal episode.

## 2012-10-15 NOTE — ED Provider Notes (Signed)
CSN: [id number]     Arrival date & time 10/15/12  7829 History   First MD Initiated Contact with Patient 10/15/12 1014     Chief Complaint  Patient presents with  . Knee Injury   (Consider location/radiation/quality/duration/timing/severity/associated sxs/prior Treatment) Patient is a 42 y.o. female presenting with knee pain.  Knee Pain Location:  Knee Time since incident:  3 days Injury: yes   Mechanism of injury: fall   Fall:    Fall occurred:  Standing   Impact surface:  Hard floor   Point of impact:  Knees   Entrapped after fall: no   Knee location:  R knee Pain details:    Quality:  Aching   Radiates to:  R leg   Severity:  Moderate   Onset quality:  Sudden   Duration:  3 days   Progression:  Worsening Chronicity:  New Dislocation: no   Foreign body present:  No foreign bodies Prior injury to area:  No Worsened by:  Bearing weight   Past Medical History  Diagnosis Date  . Syncope     PT STATES HER HEART AND BRAIN "MISFIRE" IF OVERLY HEATED OR UPSET--AND PT WOULD PASS OUT --NO EPISODES SINCE 2000  - NO MEDICATIONS--PT NO LONGER SEES CARDIOLOGIST  . GERD (gastroesophageal reflux disease)     OCCASIONAL  . Mass     RIGHT OVARY  --ABDOMINAL PAIN  . Panic attacks    Past Surgical History  Procedure Laterality Date  . Wisdom tooth extraction    . Abdominal hysterectomy     History reviewed. No pertinent family history. History  Substance Use Topics  . Smoking status: Never Smoker   . Smokeless tobacco: Never Used  . Alcohol Use: Yes     Comment: RARELY   OB History   Grav Para Term Preterm Abortions TAB SAB Ect Mult Living                 Review of Systems  Cardiovascular: Negative for chest pain.  Musculoskeletal: Positive for arthralgias.  Neurological: Positive for syncope.  All other systems reviewed and are negative.    Allergies  Latex; Other; and Phenobarbital  Home Medications   Current Outpatient Rx  Name  Route  Sig  Dispense   Refill  . aspirin EC 81 MG tablet   Oral   Take 1 tablet (81 mg total) by mouth daily.   30 tablet   0   . cholecalciferol (VITAMIN D) 1000 UNITS tablet   Oral   Take 1,000 Units by mouth daily.         Marland Kitchen co-enzyme Q-10 30 MG capsule   Oral   Take 200 mg by mouth daily.          Marland Kitchen esomeprazole (NEXIUM) 40 MG capsule   Oral   Take 40 mg by mouth. ONCE A DAY  IF NEEDED         . loratadine (CLARITIN) 10 MG tablet   Oral   Take 10 mg by mouth daily.         . phentermine 37.5 MG capsule   Oral   Take 37.5 mg by mouth every morning.         . ranitidine (ZANTAC) 300 MG tablet   Oral   Take 1 tablet (300 mg total) by mouth at bedtime.   30 tablet   0   . simvastatin (ZOCOR) 20 MG tablet   Oral   Take 20 mg by mouth at bedtime.  BP 129/80  Pulse 81  Temp(Src) 98.3 F (36.8 C) (Oral)  Resp 20  Ht 5\' 4"  (1.626 m)  SpO2 97%  LMP 06/04/2011 Physical Exam  Nursing note and vitals reviewed. Constitutional: She is oriented to person, place, and time. She appears well-developed and well-nourished.  HENT:  Head: Normocephalic.  Eyes: Pupils are equal, round, and reactive to light.  Neck: Normal range of motion.  Cardiovascular: Normal rate and regular rhythm.   Pulmonary/Chest: Effort normal and breath sounds normal.  Abdominal: Soft.  Musculoskeletal: She exhibits tenderness.  Neurological: She is alert and oriented to person, place, and time.  Skin: Skin is warm and dry.  Psychiatric: She has a normal mood and affect. Her behavior is normal. Judgment and thought content normal.    ED Course  Procedures (including critical care time) Labs Review Labs Reviewed - No data to display Imaging Review No results found.  EKG Interpretation   None      Patient with syncopal episode at work on Friday, fell to floor, landing on knees.  Pain in right knee extending to mid tibia.  Mild swelling and discoloration noted below knee.  Patient has been  evaluated by cardiology for her syncope, and is currently followed by her PCP for continuing management.  Radiology results reviewed and shared with patient.   No bony abnormality.  Knee contusion.  RICEM, ortho follow-up if no improvement. MDM  Knee pain.   Jimmye Norman, NP 10/15/12 1710

## 2012-10-15 NOTE — Discharge Instructions (Signed)
Contusion A contusion is the result of an injury to the skin and underlying tissues and is usually caused by direct trauma. The injury results in the appearance of a bruise on the skin overlying the injured tissues. Contusions cause rupture and bleeding of the small capillaries and blood vessels and affect function, because the bleeding infiltrates muscles, tendons, nerves, or other soft tissues.  SYMPTOMS   Swelling and often a hard lump in the injured area, either superficial or deep.  Pain and tenderness over the area of the contusion.  Feeling of firmness when pressure is exerted over the contusion.  Discoloration under the skin, beginning with redness and progressing to the characteristic "black and blue" bruise. CAUSES  A contusion is typically the result of direct trauma.  PREVENTION  Maintain physical fitness:  Joint and muscle flexibility.  Strength and endurance.  Coordination. PROGNOSIS  Contusions typically heal without any complications. Healing time varies with the severity of injury and intake of medications that affect clotting. Contusions usually heal in 1 to 4 weeks. RELATED COMPLICATIONS   Damage to nearby nerves or blood vessels, causing numbness, coldness, or paleness.  Compartment syndrome.  Bleeding into the soft tissues that leads to disability.  Infiltrative-type bleeding, leading to the calcification and impaired function of the injured muscle (rare).  Prolonged healing time if usual activities are resumed too soon.  Infection if the skin over the injury site is broken.  Fracture of the bone underlying the contusion.  Stiffness in the joint where the injured muscle crosses. TREATMENT  Treatment initially consists of resting the injured area as well as medication and ice to reduce inflammation. The use of a compression bandage may also be helpful in minimizing inflammation. As pain diminishes and movement is tolerated, the joint where the affected  muscle crosses should be moved to prevent stiffness and the shortening (contracture) of the joint. Movement of the joint should begin as soon as possible. It is also important to work on maintaining strength within the affected muscles.  MEDICATION   If pain relief is necessary these medications are often recommended:  Nonsteroidal anti-inflammatory medications, such as aspirin and ibuprofen.  Other minor pain relievers, such as acetaminophen, are often recommended.  Prescription pain relievers may be given by your caregiver. Use only as directed and only as much as you need. HEAT AND COLD  Cold treatment (icing) relieves pain and reduces inflammation. Cold treatment should be applied for 10 to 15 minutes every 2 to 3 hours for inflammation and pain and immediately after any activity that aggravates your symptoms. Use ice packs or an ice massage. (To do an ice massage fill a large styrofoam cup with water and freeze. Tear a small amount of foam from the top so ice protrudes. Massage ice firmly over the injured area in a circle about the size of a softball.)  Heat treatment may be used prior to performing the stretching and strengthening activities prescribed by your caregiver, physical therapist, or athletic trainer. Use a heat pack or a warm soak. SEEK MEDICAL CARE IF:   Symptoms get worse or do not improve despite treatment in a few days.  You have difficulty moving a joint.  Any extremity becomes extremely painful, numb, pale, or cool (This is an emergency!).  Medication produces any side effects (bleeding, upset stomach, or allergic reaction).  Signs of infection (drainage from skin, headache, muscle aches, dizziness, fever, or general ill feeling) occur if skin was broken. Document Released: 12/20/2004 Document Revised: 03/14/2011  Document Reviewed: 04/03/2008 [facility] Patient Information 2014 [place], [place].

## 2012-10-15 NOTE — ED Notes (Signed)
Patient transported to X-ray 

## 2012-10-20 NOTE — ED Provider Notes (Signed)
Medical screening examination/treatment/procedure(s) were performed by non-physician practitioner and as supervising physician I was immediately available for consultation/collaboration.  Martha K Linker, MD 10/20/12 0909 

## 2012-11-16 ENCOUNTER — Encounter: Payer: Self-pay | Admitting: Obstetrics & Gynecology

## 2012-11-23 ENCOUNTER — Telehealth: Payer: Self-pay | Admitting: Cardiology

## 2012-11-23 NOTE — Telephone Encounter (Signed)
Please let patient know that she had successful CPAP titration to 6cm H2O and set up OV with me in 10 weeks and forward to PCP

## 2012-11-23 NOTE — Telephone Encounter (Signed)
Pt is aware. Will send to [facility] to get pt to 10 Week f/u

## 2013-01-14 ENCOUNTER — Encounter: Payer: Self-pay | Admitting: Obstetrics & Gynecology

## 2013-01-14 ENCOUNTER — Ambulatory Visit (INDEPENDENT_AMBULATORY_CARE_PROVIDER_SITE_OTHER): Admitting: Obstetrics & Gynecology

## 2013-01-14 VITALS — BP 135/85 | HR 105 | Temp 98.6°F | Ht 64.0 in | Wt 216.0 lb

## 2013-01-14 DIAGNOSIS — N390 Urinary tract infection, site not specified: Secondary | ICD-10-CM

## 2013-01-14 LAB — POCT URINALYSIS DIPSTICK
Bilirubin, UA: NEGATIVE
Glucose, UA: NEGATIVE
Ketones, UA: NEGATIVE
Nitrite, UA: POSITIVE
Spec Grav, UA: 1.025
Urobilinogen, UA: NEGATIVE
pH, UA: 5

## 2013-01-14 MED ORDER — SULFAMETHOXAZOLE-TMP DS 800-160 MG PO TABS: 1.0000 | ORAL_TABLET | Freq: Two times a day (BID) | ORAL | Status: DC

## 2013-01-14 NOTE — Progress Notes (Signed)
Patient in office for a possible UTI. Pt states she is having urgency and frequency. Pt states she saw a little blood when wiping this morning. Pt denies any burning with urination Pt states she has a slight discomfort with urination. Pt states she noticed it starting about 1 week ago.

## 2013-01-17 LAB — URINE CULTURE: Colony Count: >=100000

## 2013-01-28 ENCOUNTER — Encounter: Payer: Self-pay | Admitting: Cardiology

## 2013-01-28 DIAGNOSIS — R229 Localized swelling, mass and lump, unspecified: Secondary | ICD-10-CM

## 2013-01-28 DIAGNOSIS — R55 Syncope and collapse: Secondary | ICD-10-CM | POA: Insufficient documentation

## 2013-01-28 DIAGNOSIS — F410 Panic disorder [episodic paroxysmal anxiety] without agoraphobia: Secondary | ICD-10-CM | POA: Insufficient documentation

## 2013-01-28 DIAGNOSIS — K219 Gastro-esophageal reflux disease without esophagitis: Secondary | ICD-10-CM | POA: Insufficient documentation

## 2013-01-28 DIAGNOSIS — IMO0002 Reserved for concepts with insufficient information to code with codable children: Secondary | ICD-10-CM | POA: Insufficient documentation

## 2013-02-04 ENCOUNTER — Encounter: Payer: Self-pay | Admitting: Cardiology

## 2013-02-04 ENCOUNTER — Ambulatory Visit (INDEPENDENT_AMBULATORY_CARE_PROVIDER_SITE_OTHER): Admitting: Cardiology

## 2013-02-04 VITALS — BP 120/80 | HR 95 | Ht 64.0 in | Wt 217.0 lb

## 2013-02-04 DIAGNOSIS — G4733 Obstructive sleep apnea (adult) (pediatric): Secondary | ICD-10-CM

## 2013-02-04 DIAGNOSIS — E669 Obesity, unspecified: Secondary | ICD-10-CM | POA: Insufficient documentation

## 2013-02-04 DIAGNOSIS — Z9989 Dependence on other enabling machines and devices: Secondary | ICD-10-CM | POA: Insufficient documentation

## 2013-02-04 NOTE — Progress Notes (Signed)
[facility], [place] [address], [place]  [address] Phone: [phone] Fax:  [phone]  Date:  02/04/2013   ID:  Margaret Davis, DOB December 27, 1970, MRN [id number]  PCP:  Salena Saner., MD Sleep Medicine:  Fransico Him, MD   History of Present Illness: Margaret Davis is a 43 y.o. female who recently complained to her PCP that he husband was complaining that she had severe snoring.  She has had issues with awakening gasping for air in the past.  She underwent PSG and was recently found to have mild OSA by PSG with an AHI of 10.89 and underwent CPAP titration to 6cm H2O.  She is now here for her 10 week followup after going on CPAP.  We received her download from her DME which showed an AHI of 5.5/hr on 6cm H2O and 43% compliance in using more than 4 hours nightly.  She says at first the device was hard to use but she has gotten better with it.  She no longer snores and sleeps better at night.  She feels rested in the am and has no daytime sleepiness.  She does not get much aerobic exercise due to some knee injury.   Wt Readings from Last 3 Encounters:  01/14/13 216 lb (97.977 kg)  09/06/12 225 lb (102.059 kg)  06/20/12 228 lb (103.42 kg)     Past Medical History  Diagnosis Date  . Syncope     PT STATES HER HEART AND BRAIN "MISFIRE" IF OVERLY HEATED OR UPSET--AND PT WOULD PASS OUT --NO EPISODES SINCE 2000  - NO MEDICATIONS--PT NO LONGER SEES CARDIOLOGIST  . GERD (gastroesophageal reflux disease)     OCCASIONAL  . Mass     RIGHT OVARY  --ABDOMINAL PAIN  . Panic attacks     Current Outpatient Prescriptions  Medication Sig Dispense Refill  . cholecalciferol (VITAMIN D) 1000 UNITS tablet Take 1,000 Units by mouth daily.      Marland Kitchen co-enzyme Q-10 30 MG capsule Take 200 mg by mouth daily.       Marland Kitchen esomeprazole (NEXIUM) 40 MG capsule Take 40 mg by mouth. ONCE A DAY  IF NEEDED      . loratadine (CLARITIN) 10 MG tablet Take 10 mg by mouth daily.      . phentermine 37.5 MG capsule  Take 37.5 mg by mouth every morning.      . simvastatin (ZOCOR) 20 MG tablet Take 20 mg by mouth at bedtime.      . sulfamethoxazole-trimethoprim (BACTRIM DS) 800-160 MG per tablet Take 1 tablet by mouth 2 (two) times daily.  6 tablet  0   No current facility-administered medications for this visit.    Allergies:    Allergies  Allergen Reactions  . Latex Hives    CONTACT WITH LATEX CAUSES HIVES  . Other     CAN'T EAT ORANGES-BUT CAN DRINK OJ  . Phenobarbital     AS ACHILD--THOUGHT TO BE HAVING SEIZURES--HAD ALLERGIC REACTION TO PHENOBARBITAL.   LATER TOLD SHE DID NOT HAVE SEIZURES    Social History:  The patient  reports that she has never smoked. She has never used smokeless tobacco. She reports that she drinks alcohol. She reports that she does not use illicit drugs.   Family History:  The patient's family history is not on file.   ROS:  Please see the history of present illness.      All other systems reviewed and negative.   PHYSICAL EXAM: VS:  LMP  06/04/2011 Well nourished, well developed, in no acute distress HEENT: normal Neck: no JVD Cardiac:  normal S1, S2; RRR; no murmur Lungs:  clear to auscultation bilaterally, no wheezing, rhonchi or rales Abd: soft, nontender, no hepatomegaly Ext: no edema Skin: warm and dry Neuro:  CNs 2-12 intact, no focal abnormalities noted       ASSESSMENT AND PLAN:  1. Mild OSA on CPAP with decreased compliance and mildly elevated AHI on download a month ago.  Repeat download today showed and AHI of 1.2/hr and 93% compliance in using more than 4 hours nightly.  Will keep settings on 6cm H2O.   2. Obesity - encouraged her to try to get some form of aerobic exercise.  Followup with me in 6 months  Signed, Fransico Him, MD 02/04/2013 4:12 PM

## 2013-02-04 NOTE — Telephone Encounter (Signed)
This encounter was created in error - please disregard.

## 2013-02-04 NOTE — Patient Instructions (Signed)
Your physician recommends that you continue on your current medications as directed. Please refer to the Current Medication list given to you today.  Your physician wants you to follow-up in: 6 Months with Dr Turner You will receive a reminder letter in the mail two months in advance. If you don't receive a letter, please call our office to schedule the follow-up appointment.  

## 2013-03-05 ENCOUNTER — Encounter: Payer: Self-pay | Admitting: Cardiology

## 2013-03-12 ENCOUNTER — Encounter: Payer: Self-pay | Admitting: Cardiology

## 2013-03-14 ENCOUNTER — Encounter: Payer: Self-pay | Admitting: General Surgery

## 2013-03-14 ENCOUNTER — Encounter: Payer: Self-pay | Admitting: Cardiology

## 2013-06-24 ENCOUNTER — Ambulatory Visit (INDEPENDENT_AMBULATORY_CARE_PROVIDER_SITE_OTHER): Admitting: Obstetrics & Gynecology

## 2013-06-24 ENCOUNTER — Encounter: Payer: Self-pay | Admitting: Obstetrics & Gynecology

## 2013-06-24 VITALS — BP 129/88 | HR 93 | Temp 99.3°F | Ht 64.0 in | Wt 213.0 lb

## 2013-06-24 DIAGNOSIS — N898 Other specified noninflammatory disorders of vagina: Secondary | ICD-10-CM

## 2013-06-24 DIAGNOSIS — Z01419 Encounter for gynecological examination (general) (routine) without abnormal findings: Secondary | ICD-10-CM

## 2013-06-24 NOTE — Progress Notes (Signed)
Subjective:     Margaret Davis is a 43 y.o. female here for a routine exam.  Current complaints: none.    Personal health questionnaire:  Is patient Ashkenazi Jewish, have a family history of breast and/or ovarian cancer: no Is there a family history of uterine cancer diagnosed at age < 24, gastrointestinal cancer, urinary tract cancer, family member who is a Field seismologist syndrome-associated carrier: no Is the patient overweight and hypertensive, family history of diabetes, personal history of gestational diabetes or PCOS: yes Is patient over 59, have PCOS,  family history of premature CHD under age 50, diabetes, smoke, have hypertension or peripheral artery disease:  no   Gynecologic History Patient's last menstrual period was 06/04/2011. Contraception: status post hysterectomy Last mammogram results were: normal  Obstetric History OB History  No data available    Past Medical History  Diagnosis Date  . Syncope     PT STATES HER HEART AND BRAIN "MISFIRE" IF OVERLY HEATED OR UPSET--AND PT WOULD PASS OUT --NO EPISODES SINCE 2000  - NO MEDICATIONS--PT NO LONGER SEES CARDIOLOGIST  . GERD (gastroesophageal reflux disease)     OCCASIONAL  . Mass     RIGHT OVARY  --ABDOMINAL PAIN  . Panic attacks     Past Surgical History  Procedure Laterality Date  . Wisdom tooth extraction    . Abdominal hysterectomy      Current outpatient prescriptions:cholecalciferol (VITAMIN D) 1000 UNITS tablet, Take 1,000 Units by mouth daily., Disp: , Rfl: ;  co-enzyme Q-10 30 MG capsule, Take 200 mg by mouth daily. , Disp: , Rfl: ;  esomeprazole (NEXIUM) 40 MG capsule, Take 40 mg by mouth. ONCE A DAY  IF NEEDED, Disp: , Rfl: ;  loratadine (CLARITIN) 10 MG tablet, Take 10 mg by mouth daily., Disp: , Rfl:  phentermine 37.5 MG capsule, Take 37.5 mg by mouth every morning., Disp: , Rfl: ;  simvastatin (ZOCOR) 20 MG tablet, Take 20 mg by mouth at bedtime., Disp: , Rfl:  Allergies  Allergen Reactions  . Latex Hives     CONTACT WITH LATEX CAUSES HIVES  . Other     CAN'T EAT ORANGES-BUT CAN DRINK OJ  . Phenobarbital     AS ACHILD--THOUGHT TO BE HAVING SEIZURES--HAD ALLERGIC REACTION TO PHENOBARBITAL.   LATER TOLD SHE DID NOT HAVE SEIZURES    History  Substance Use Topics  . Smoking status: Never Smoker   . Smokeless tobacco: Never Used  . Alcohol Use: Yes     Comment: RARELY    Family History  Problem Relation Age of Onset  . Diabetes Mother   . Hypertension Father       Review of Systems  Constitutional: negative for fatigue and weight loss Respiratory: negative for cough and wheezing Cardiovascular: negative for chest pain, fatigue and palpitations Gastrointestinal: negative for abdominal pain and change in bowel habits Musculoskeletal:negative for myalgias Neurological: negative for gait problems and tremors Behavioral/Psych: negative for abusive relationship, depression Endocrine: negative for temperature intolerance   Genitourinary:negative for abnormal menstrual periods, genital lesions, hot flashes and vaginal dryness; positive for vaginal dryness  Integument/breast: negative for breast lump, breast tenderness, nipple discharge and skin lesion(s)    Objective:       BP 129/88  Pulse 93  Temp(Src) 99.3 F (37.4 C)  Ht 5\' 4"  (1.626 m)  Wt 96.616 kg (213 lb)  BMI 36.54 kg/m2  LMP 06/04/2011 General:   alert  Skin:   no rash or abnormalities  Lungs:  clear to auscultation bilaterally  Heart:   regular rate and rhythm, S1, S2 normal, no murmur, click, rub or gallop  Breasts:   normal without suspicious masses, skin or nipple changes or axillary nodes  Abdomen:  normal findings: no organomegaly, soft, non-tender and no hernia  Pelvis:  External genitalia: normal general appearance Urinary system: urethral meatus normal and bladder without fullness, nontender Vaginal: normal without tenderness, induration or masses; thick white discharge Adnexa: normal bimanual exam     Lab Review  Labs reviewed no Radiologic studies reviewed no    Assessment:    Healthy female exam.   Vaginal dryness Plan:    Education reviewed: calcium supplements, low fat, low cholesterol diet and weight bearing exercise.   Orders Placed This Encounter  Procedures  . WET PREP BY MOLECULAR PROBE  . [place]   Possible management options include: water soluble lubricants, vaginal moisturizer Follow up in 1 month

## 2013-06-25 LAB — WET PREP BY MOLECULAR PROBE
Candida species: NEGATIVE
Gardnerella vaginalis: NEGATIVE
Trichomonas vaginosis: NEGATIVE

## 2013-06-25 LAB — FOLLICLE STIMULATING HORMONE: FSH: 7.3 m[IU]/mL

## 2013-06-25 NOTE — Patient Instructions (Signed)

## 2013-07-15 ENCOUNTER — Telehealth: Payer: Self-pay | Admitting: *Deleted

## 2013-07-15 NOTE — Telephone Encounter (Signed)
Patient states she had an appointment to review her test results- but it was canceled due to the doctor being out of the office for surgery. Patient would like Dr Delsa Sale to call her, otherwise she will have to wait until August for an appointment.

## 2013-07-17 NOTE — Telephone Encounter (Signed)
Please call pt--FSH in premenopausal range.  Test of vaginal discharge normal.  Encourage to keep f/u appointment.

## 2013-07-18 ENCOUNTER — Ambulatory Visit: Admitting: Obstetrics & Gynecology

## 2013-07-18 NOTE — Telephone Encounter (Signed)
Patient notified

## 2013-08-02 ENCOUNTER — Telehealth: Payer: Self-pay | Admitting: Internal Medicine

## 2013-08-02 NOTE — Telephone Encounter (Signed)
called pt 07/31@1 :42 and LVM about [facility] appt.  [place]

## 2013-08-05 ENCOUNTER — Ambulatory Visit: Admitting: Obstetrics & Gynecology

## 2013-08-07 ENCOUNTER — Ambulatory Visit: Admitting: Obstetrics & Gynecology

## 2013-08-19 ENCOUNTER — Encounter: Payer: Self-pay | Admitting: Obstetrics & Gynecology

## 2013-08-19 ENCOUNTER — Ambulatory Visit (INDEPENDENT_AMBULATORY_CARE_PROVIDER_SITE_OTHER): Admitting: Obstetrics & Gynecology

## 2013-08-19 VITALS — BP 131/90 | HR 90 | Temp 98.4°F | Wt 211.0 lb

## 2013-08-19 DIAGNOSIS — N951 Menopausal and female climacteric states: Secondary | ICD-10-CM

## 2013-08-19 NOTE — Progress Notes (Signed)
Patient ID: Margaret Davis, female   DOB: 07-10-1970, 43 y.o.   MRN: [id number]  Chief Complaint  Patient presents with  . Follow-up    lab results    HPI Margaret Davis is a 43 y.o. female.  No complaints.  HPI  Past Medical History  Diagnosis Date  . Syncope     PT STATES HER HEART AND BRAIN "MISFIRE" IF OVERLY HEATED OR UPSET--AND PT WOULD PASS OUT --NO EPISODES SINCE 2000  - NO MEDICATIONS--PT NO LONGER SEES CARDIOLOGIST  . GERD (gastroesophageal reflux disease)     OCCASIONAL  . Mass     RIGHT OVARY  --ABDOMINAL PAIN  . Panic attacks     Past Surgical History  Procedure Laterality Date  . Wisdom tooth extraction    . Abdominal hysterectomy      Family History  Problem Relation Age of Onset  . Diabetes Mother   . Hypertension Father     Social History History  Substance Use Topics  . Smoking status: Never Smoker   . Smokeless tobacco: Never Used  . Alcohol Use: Yes     Comment: RARELY    Allergies  Allergen Reactions  . Latex Hives    CONTACT WITH LATEX CAUSES HIVES  . Other     CAN'T EAT ORANGES-BUT CAN DRINK OJ  . Phenobarbital     AS ACHILD--THOUGHT TO BE HAVING SEIZURES--HAD ALLERGIC REACTION TO PHENOBARBITAL.   LATER TOLD SHE DID NOT HAVE SEIZURES      Review of Systems Review of Systems Constitutional: negative for fatigue and weight loss Respiratory: negative for cough and wheezing Cardiovascular: negative for chest pain, fatigue and palpitations Gastrointestinal: negative for abdominal pain and change in bowel habits Genitourinary:negative Integument/breast: negative for nipple discharge Musculoskeletal:negative for myalgias Neurological: negative for gait problems and tremors Behavioral/Psych: negative for abusive relationship, depression Endocrine: negative for temperature intolerance     Blood pressure 131/90, pulse 90, temperature 98.4 F (36.9 C), weight 95.709 kg (211 lb), last menstrual period 06/04/2011.  Physical  Exam Physical Exam General:   alert    50% of 15 min visit spent on counseling and coordination of care.   Data Reviewed Labs  Assessment    Perimenopausal symptoms     Plan    Orders Placed This Encounter  Procedures  . CBC  . TSH   Follow up as needed.         JACKSON-MOORE,LISA A 08/19/2013, 5:45 PM

## 2013-08-19 NOTE — Patient Instructions (Signed)

## 2013-08-20 LAB — CBC
HCT: 39.8 % (ref 36.0–46.0)
Hemoglobin: 13.7 g/dL (ref 12.0–15.0)
MCH: 30.2 pg (ref 26.0–34.0)
MCHC: 34.4 g/dL (ref 30.0–36.0)
MCV: 87.7 fL (ref 78.0–100.0)
Platelets: 400 10*3/uL (ref 150–400)
RBC: 4.54 MIL/uL (ref 3.87–5.11)
RDW: 14.4 % (ref 11.5–15.5)
WBC: 5.9 10*3/uL (ref 4.0–10.5)

## 2013-08-20 LAB — TSH: TSH: 1.36 u[IU]/mL (ref 0.350–4.500)

## 2013-08-29 ENCOUNTER — Telehealth: Payer: Self-pay | Admitting: *Deleted

## 2013-08-29 NOTE — Telephone Encounter (Signed)
Pt placed call to office regarding lab results. Return call made to pt making her aware of lab results from last visit.  Pt advised to f/u with office as needed.

## 2013-11-18 ENCOUNTER — Encounter: Payer: Self-pay | Admitting: Obstetrics & Gynecology

## 2013-11-18 ENCOUNTER — Ambulatory Visit (INDEPENDENT_AMBULATORY_CARE_PROVIDER_SITE_OTHER): Admitting: Obstetrics & Gynecology

## 2013-11-18 VITALS — BP 131/87 | HR 95 | Temp 98.1°F | Ht 64.0 in | Wt 212.0 lb

## 2013-11-18 DIAGNOSIS — R102 Pelvic and perineal pain: Secondary | ICD-10-CM

## 2013-11-18 LAB — POCT URINALYSIS DIPSTICK
Bilirubin, UA: NEGATIVE
Blood, UA: NEGATIVE
Glucose, UA: NEGATIVE
Ketones, UA: NEGATIVE
Leukocytes, UA: NEGATIVE
Nitrite, UA: NEGATIVE
Protein, UA: NEGATIVE
Spec Grav, UA: 1.015
Urobilinogen, UA: NEGATIVE
pH, UA: 5

## 2013-11-18 NOTE — Patient Instructions (Signed)
Abdominal Pain, Women °Abdominal (stomach, pelvic, or belly) pain can be caused by many things. It is important to tell your doctor: °· The location of the pain. °· Does it come and go or is it present all the time? °· Are there things that start the pain (eating certain foods, exercise)? °· Are there other symptoms associated with the pain (fever, nausea, vomiting, diarrhea)? °All of this is helpful to know when trying to find the cause of the pain. °CAUSES  °· Stomach: virus or bacteria infection, or ulcer. °· Intestine: appendicitis (inflamed appendix), regional ileitis (Crohn's disease), ulcerative colitis (inflamed colon), irritable bowel syndrome, diverticulitis (inflamed diverticulum of the colon), or cancer of the stomach or intestine. °· Gallbladder disease or stones in the gallbladder. °· Kidney disease, kidney stones, or infection. °· Pancreas infection or cancer. °· Fibromyalgia (pain disorder). °· Diseases of the female organs: °¨ Uterus: fibroid (non-cancerous) tumors or infection. °¨ Fallopian tubes: infection or tubal pregnancy. °¨ Ovary: cysts or tumors. °¨ Pelvic adhesions (scar tissue). °¨ Endometriosis (uterus lining tissue growing in the pelvis and on the pelvic organs). °¨ Pelvic congestion syndrome (female organs filling up with blood just before the menstrual period). °¨ Pain with the menstrual period. °¨ Pain with ovulation (producing an egg). °¨ Pain with an IUD (intrauterine device, birth control) in the uterus. °¨ Cancer of the female organs. °· Functional pain (pain not caused by a disease, may improve without treatment). °· Psychological pain. °· Depression. °DIAGNOSIS  °Your doctor will decide the seriousness of your pain by doing an examination. °· Blood tests. °· X-rays. °· Ultrasound. °· CT scan (computed tomography, special type of X-ray). °· MRI (magnetic resonance imaging). °· Cultures, for infection. °· Barium enema (dye inserted in the large intestine, to better view it with  X-rays). °· Colonoscopy (looking in intestine with a lighted tube). °· Laparoscopy (minor surgery, looking in abdomen with a lighted tube). °· Major abdominal exploratory surgery (looking in abdomen with a large incision). °TREATMENT  °The treatment will depend on the cause of the pain.  °· Many cases can be observed and treated at home. °· Over-the-counter medicines recommended by your caregiver. °· Prescription medicine. °· Antibiotics, for infection. °· Birth control pills, for painful periods or for ovulation pain. °· Hormone treatment, for endometriosis. °· Nerve blocking injections. °· Physical therapy. °· Antidepressants. °· Counseling with a psychologist or psychiatrist. °· Minor or major surgery. °HOME CARE INSTRUCTIONS  °· Do not take laxatives, unless directed by your caregiver. °· Take over-the-counter pain medicine only if ordered by your caregiver. Do not take aspirin because it can cause an upset stomach or bleeding. °· Try a clear liquid diet (broth or water) as ordered by your caregiver. Slowly move to a bland diet, as tolerated, if the pain is related to the stomach or intestine. °· Have a thermometer and take your temperature several times a day, and record it. °· Bed rest and sleep, if it helps the pain. °· Avoid sexual intercourse, if it causes pain. °· Avoid stressful situations. °· Keep your follow-up appointments and tests, as your caregiver orders. °· If the pain does not go away with medicine or surgery, you may try: °¨ Acupuncture. °¨ Relaxation exercises (yoga, meditation). °¨ Group therapy. °¨ Counseling. °SEEK MEDICAL CARE IF:  °· You notice certain foods cause stomach pain. °· Your home care treatment is not helping your pain. °· You need stronger pain medicine. °· You want your IUD removed. °· You feel faint or   lightheaded. °· You develop nausea and vomiting. °· You develop a rash. °· You are having side effects or an allergy to your medicine. °SEEK IMMEDIATE MEDICAL CARE IF:  °· Your  pain does not go away or gets worse. °· You have a fever. °· Your pain is felt only in portions of the abdomen. The right side could possibly be appendicitis. The left lower portion of the abdomen could be colitis or diverticulitis. °· You are passing blood in your stools (bright red or black tarry stools, with or without vomiting). °· You have blood in your urine. °· You develop chills, with or without a fever. °· You pass out. °MAKE SURE YOU:  °· Understand these instructions. °· Will watch your condition. °· Will get help right away if you are not doing well or get worse. °Document Released: 10/17/2006 Document Revised: 05/06/2013 Document Reviewed: 11/06/2008 °Exit[employer]® Patient Information ©2015 [employer]. This information is not intended to replace advice given to you by your health care provider. Make sure you discuss any questions you have with your health care provider. ° °

## 2013-11-18 NOTE — Progress Notes (Signed)
Patient ID: Margaret Davis, female   DOB: 1970/09/18, 43 y.o.   MRN: [id number]  Chief Complaint  Patient presents with  . Pelvic Pain    Pain near right ovary x 4-5days    HPI Margaret Davis is a 43 y.o. female.   Abdominal Pain This is a new problem. The current episode started in the past 7 days. The problem occurs intermittently. The problem has been unchanged. The pain is located in the RLQ. The quality of the pain is aching. The abdominal pain does not radiate. Pertinent negatives include no anorexia, constipation, diarrhea, dysuria, fever, frequency, hematuria, myalgias, nausea or vomiting. Nothing aggravates the pain. The pain is relieved by nothing.    Past Medical History  Diagnosis Date  . Syncope     PT STATES HER HEART AND BRAIN "MISFIRE" IF OVERLY HEATED OR UPSET--AND PT WOULD PASS OUT --NO EPISODES SINCE 2000  - NO MEDICATIONS--PT NO LONGER SEES CARDIOLOGIST  . GERD (gastroesophageal reflux disease)     OCCASIONAL  . Mass     RIGHT OVARY  --ABDOMINAL PAIN  . Panic attacks     Past Surgical History  Procedure Laterality Date  . Wisdom tooth extraction    . Abdominal hysterectomy      Family History  Problem Relation Age of Onset  . Diabetes Mother   . Hypertension Father     Social History History  Substance Use Topics  . Smoking status: Never Smoker   . Smokeless tobacco: Never Used  . Alcohol Use: Yes     Comment: RARELY    Allergies  Allergen Reactions  . Latex Hives    CONTACT WITH LATEX CAUSES HIVES  . Other     CAN'T EAT ORANGES-BUT CAN DRINK OJ  . Phenobarbital     AS ACHILD--THOUGHT TO BE HAVING SEIZURES--HAD ALLERGIC REACTION TO PHENOBARBITAL.   LATER TOLD SHE DID NOT HAVE SEIZURES    Current Outpatient Prescriptions  Medication Sig Dispense Refill  . cholecalciferol (VITAMIN D) 1000 UNITS tablet Take 1,000 Units by mouth daily.    Marland Kitchen co-enzyme Q-10 30 MG capsule Take 200 mg by mouth daily.     Marland Kitchen esomeprazole (NEXIUM) 40 MG capsule  Take 40 mg by mouth. ONCE A DAY  IF NEEDED    . loratadine (CLARITIN) 10 MG tablet Take 10 mg by mouth daily.    . simvastatin (ZOCOR) 20 MG tablet Take 20 mg by mouth at bedtime.    . phentermine 37.5 MG capsule Take 37.5 mg by mouth every morning.     No current facility-administered medications for this visit.    Review of Systems Review of Systems  Constitutional: Negative for fever.  Gastrointestinal: Positive for abdominal pain. Negative for nausea, vomiting, diarrhea, constipation and anorexia.  Genitourinary: Negative for dysuria, frequency and hematuria.  Musculoskeletal: Negative for myalgias.   Constitutional: negative for fatigue and weight loss Respiratory: negative for cough and wheezing Cardiovascular: negative for chest pain, fatigue and palpitations Gastrointestinal: negative for abdominal pain and change in bowel habits Genitourinary:negative for abnormal vaginal discharge Integument/breast: negative for nipple discharge Musculoskeletal:negative for myalgias Neurological: negative for gait problems and tremors Behavioral/Psych: negative for abusive relationship, depression Endocrine: negative for temperature intolerance     Blood pressure 131/87, pulse 95, temperature 98.1 F (36.7 C), height 5\' 4"  (1.626 m), weight 96.163 kg (212 lb), last menstrual period 06/04/2011.  Physical Exam Physical Exam General:   alert  Skin:   no rash or abnormalities  Lungs:  clear to auscultation bilaterally  Heart:   regular rate and rhythm, S1, S2 normal, no murmur, click, rub or gallop  Abdomen:  normal findings: no organomegaly, soft, non-tender and no hernia  Pelvis:  External genitalia: normal general appearance Urinary system: urethral meatus normal and bladder without fullness, nontender Vaginal: normal without tenderness, induration or masses Adnexa: normal bimanual exam l   Limited U/S--ovaries not well visualized    Data Reviewed None  Assessment     Subacute RLQ, groin pain--?etiology    Plan    Orders Placed This Encounter  Procedures  . Urine culture  . US Pelvis Complete    Standing Status: Future     Number of Occurrences:      Standing Expiration Date: 01/19/2015    Order Specific Question:  Reason for Exam (SYMPTOM  OR DIAGNOSIS REQUIRED)    Answer:  Right ovary pain    Order Specific Question:  Preferred imaging location?    Answer:  Internal  . Urinalysis, Routine w reflex microscopic  . POCT urinalysis dipstick     Follow up after the U/S         JACKSON-MOORE,LISA A 11/18/2013, 5:37 PM

## 2013-11-19 LAB — URINALYSIS, ROUTINE W REFLEX MICROSCOPIC
Bilirubin Urine: NEGATIVE
Glucose, UA: NEGATIVE mg/dL
Hgb urine dipstick: NEGATIVE
Ketones, ur: NEGATIVE mg/dL
Leukocytes, UA: NEGATIVE
Nitrite: NEGATIVE
Protein, ur: NEGATIVE mg/dL
Specific Gravity, Urine: 1.02 (ref 1.005–1.030)
Urobilinogen, UA: 0.2 mg/dL (ref 0.0–1.0)
pH: 5.5 (ref 5.0–8.0)

## 2013-11-19 LAB — URINE CULTURE
Colony Count: NO GROWTH
Organism ID, Bacteria: NO GROWTH

## 2013-11-20 ENCOUNTER — Other Ambulatory Visit: Payer: Self-pay | Admitting: Obstetrics & Gynecology

## 2013-11-20 DIAGNOSIS — R102 Pelvic and perineal pain: Secondary | ICD-10-CM

## 2013-11-27 ENCOUNTER — Encounter: Payer: Self-pay | Admitting: Obstetrics & Gynecology

## 2013-11-27 ENCOUNTER — Ambulatory Visit (INDEPENDENT_AMBULATORY_CARE_PROVIDER_SITE_OTHER)

## 2013-11-27 ENCOUNTER — Other Ambulatory Visit: Payer: Self-pay | Admitting: Obstetrics & Gynecology

## 2013-11-27 ENCOUNTER — Ambulatory Visit (INDEPENDENT_AMBULATORY_CARE_PROVIDER_SITE_OTHER): Admitting: Obstetrics & Gynecology

## 2013-11-27 VITALS — BP 132/95 | HR 72 | Temp 98.4°F | Ht 64.0 in | Wt 216.0 lb

## 2013-11-27 DIAGNOSIS — R1031 Right lower quadrant pain: Secondary | ICD-10-CM

## 2013-11-27 DIAGNOSIS — R102 Pelvic and perineal pain: Secondary | ICD-10-CM

## 2013-11-27 NOTE — Patient Instructions (Signed)
Diet and Irritable Bowel Syndrome  No cure has been found for irritable bowel syndrome (IBS). Many options are available to treat the symptoms. Your caregiver will give you the best treatments available for your symptoms. He or she will also encourage you to manage stress and to make changes to your diet. You need to work with your caregiver and Registered Dietician to find the best combination of medicine, diet, counseling, and support to control your symptoms. The following are some diet suggestions. FOODS THAT MAKE IBS WORSE  Fatty foods, such as [place] fries.  Milk products, such as cheese or ice cream.  Chocolate.  Alcohol.  Caffeine (found in coffee and some sodas).  Carbonated drinks, such as soda. If certain foods cause symptoms, you should eat less of them or stop eating them. FOOD JOURNAL   Keep a journal of the foods that seem to cause distress. Write down:  What you are eating during the day and when.  What problems you are having after eating.  When the symptoms occur in relation to your meals.  What foods always make you feel badly.  Take your notes with you to your caregiver to see if you should stop eating certain foods. FOODS THAT MAKE IBS BETTER Fiber reduces IBS symptoms, especially constipation, because it makes stools soft, bulky, and easier to pass. Fiber is found in bran, bread, cereal, beans, fruit, and vegetables. Examples of foods with fiber include:  Apples.  Peaches.  Pears.  Berries.  Figs.  Broccoli, raw.  Cabbage.  Carrots.  Raw peas.  Kidney beans.  Lima beans.  Whole-grain bread.  Whole-grain cereal. Add foods with fiber to your diet a little at a time. This will let your body get used to them. Too much fiber at once might cause gas and swelling of your abdomen. This can trigger symptoms in a person with IBS. Caregivers usually recommend a diet with enough fiber to produce soft, painless bowel movements. High fiber diets may  cause gas and bloating. However, these symptoms often go away within a few weeks, as your body adjusts. In many cases, dietary fiber may lessen IBS symptoms, particularly constipation. However, it may not help pain or diarrhea. High fiber diets keep the colon mildly enlarged (distended) with the added fiber. This may help prevent spasms in the colon. Some forms of fiber also keep water in the stool, thereby preventing hard stools that are difficult to pass.  Besides telling you to eat more foods with fiber, your caregiver may also tell you to get more fiber by taking a fiber pill or drinking water mixed with a special high fiber powder. An example of this is a natural fiber laxative containing psyllium seed.  TIPS  Large meals can cause cramping and diarrhea in people with IBS. If this happens to you, try eating 4 or 5 small meals a day, or try eating less at each of your usual 3 meals. It may also help if your meals are low in fat and high in carbohydrates. Examples of carbohydrates are pasta, rice, whole-grain breads and cereals, fruits, and vegetables.  If dairy products cause your symptoms to flare up, you can try eating less of those foods. You might be able to handle yogurt better than other dairy products, because it contains bacteria that helps with digestion. Dairy products are an important source of calcium and other nutrients. If you need to avoid dairy products, be sure to talk with a Registered Dietitian about getting these nutrients  through other food sources.  Drink enough water and fluids to keep your urine clear or pale yellow. This is important, especially if you have diarrhea. FOR MORE INFORMATION  International Foundation for Functional Gastrointestinal Disorders: www.iffgd.org  National Digestive Diseases Information Clearinghouse: digestive.AmenCredit.is Document Released: 03/12/2003 Document Revised: 03/14/2011 Document Reviewed: 03/22/2013 [facility] Patient Information 2015  [place], [place]. This information is not intended to replace advice given to you by your health care provider. Make sure you discuss any questions you have with your health care provider. Insomnia Insomnia is frequent trouble falling and/or staying asleep. Insomnia can be a long term problem or a short term problem. Both are common. Insomnia can be a short term problem when the wakefulness is related to a certain stress or worry. Long term insomnia is often related to ongoing stress during waking hours and/or poor sleeping habits. Overtime, sleep deprivation itself can make the problem worse. Every little thing feels more severe because you are overtired and your ability to cope is decreased. CAUSES   Stress, anxiety, and depression.  Poor sleeping habits.  Distractions such as TV in the bedroom.  Naps close to bedtime.  Engaging in emotionally charged conversations before bed.  Technical reading before sleep.  Alcohol and other sedatives. They may make the problem worse. They can hurt normal sleep patterns and normal dream activity.  Stimulants such as caffeine for several hours prior to bedtime.  Pain syndromes and shortness of breath can cause insomnia.  Exercise late at night.  Changing time zones may cause sleeping problems (jet lag). It is sometimes helpful to have someone observe your sleeping patterns. They should look for periods of not breathing during the night (sleep apnea). They should also look to see how long those periods last. If you live alone or observers are uncertain, you can also be observed at a sleep clinic where your sleep patterns will be professionally monitored. Sleep apnea requires a checkup and treatment. Give your caregivers your medical history. Give your caregivers observations your family has made about your sleep.  SYMPTOMS   Not feeling rested in the morning.  Anxiety and restlessness at bedtime.  Difficulty falling and staying asleep. TREATMENT    Your caregiver may prescribe treatment for an underlying medical disorders. Your caregiver can give advice or help if you are using alcohol or other drugs for self-medication. Treatment of underlying problems will usually eliminate insomnia problems.  Medications can be prescribed for short time use. They are generally not recommended for lengthy use.  Over-the-counter sleep medicines are not recommended for lengthy use. They can be habit forming.  You can promote easier sleeping by making lifestyle changes such as:  Using relaxation techniques that help with breathing and reduce muscle tension.  Exercising earlier in the day.  Changing your diet and the time of your last meal. No night time snacks.  Establish a regular time to go to bed.  Counseling can help with stressful problems and worry.  Soothing music and white noise may be helpful if there are background noises you cannot remove.  Stop tedious detailed work at least one hour before bedtime. HOME CARE INSTRUCTIONS   Keep a diary. Inform your caregiver about your progress. This includes any medication side effects. See your caregiver regularly. Take note of:  Times when you are asleep.  Times when you are awake during the night.  The quality of your sleep.  How you feel the next day. This information will help your caregiver care for you.  Get out of bed if you are still awake after 15 minutes. Read or do some quiet activity. Keep the lights down. Wait until you feel sleepy and go back to bed.  Keep regular sleeping and waking hours. Avoid naps.  Exercise regularly.  Avoid distractions at bedtime. Distractions include watching television or engaging in any intense or detailed activity like attempting to balance the household checkbook.  Develop a bedtime ritual. Keep a familiar routine of bathing, brushing your teeth, climbing into bed at the same time each night, listening to soothing music. Routines increase the  success of falling to sleep faster.  Use relaxation techniques. This can be using breathing and muscle tension release routines. It can also include visualizing peaceful scenes. You can also help control troubling or intruding thoughts by keeping your mind occupied with boring or repetitive thoughts like the old concept of counting sheep. You can make it more creative like imagining planting one beautiful flower after another in your backyard garden.  During your day, work to eliminate stress. When this is not possible use some of the previous suggestions to help reduce the anxiety that accompanies stressful situations. MAKE SURE YOU:   Understand these instructions.  Will watch your condition.  Will get help right away if you are not doing well or get worse. Document Released: 12/18/1999 Document Revised: 03/14/2011 Document Reviewed: 01/17/2007 [facility] Patient Information 2015 [place], [place]. This information is not intended to replace advice given to you by your health care provider. Make sure you discuss any questions you have with your health care provider. Diet and Irritable Bowel Syndrome  No cure has been found for irritable bowel syndrome (IBS). Many options are available to treat the symptoms. Your caregiver will give you the best treatments available for your symptoms. He or she will also encourage you to manage stress and to make changes to your diet. You need to work with your caregiver and Registered Dietician to find the best combination of medicine, diet, counseling, and support to control your symptoms. The following are some diet suggestions. FOODS THAT MAKE IBS WORSE  Fatty foods, such as [place] fries.  Milk products, such as cheese or ice cream.  Chocolate.  Alcohol.  Caffeine (found in coffee and some sodas).  Carbonated drinks, such as soda. If certain foods cause symptoms, you should eat less of them or stop eating them. FOOD JOURNAL   Keep a journal of the  foods that seem to cause distress. Write down:  What you are eating during the day and when.  What problems you are having after eating.  When the symptoms occur in relation to your meals.  What foods always make you feel badly.  Take your notes with you to your caregiver to see if you should stop eating certain foods. FOODS THAT MAKE IBS BETTER Fiber reduces IBS symptoms, especially constipation, because it makes stools soft, bulky, and easier to pass. Fiber is found in bran, bread, cereal, beans, fruit, and vegetables. Examples of foods with fiber include:  Apples.  Peaches.  Pears.  Berries.  Figs.  Broccoli, raw.  Cabbage.  Carrots.  Raw peas.  Kidney beans.  Lima beans.  Whole-grain bread.  Whole-grain cereal. Add foods with fiber to your diet a little at a time. This will let your body get used to them. Too much fiber at once might cause gas and swelling of your abdomen. This can trigger symptoms in a person with IBS. Caregivers usually recommend a diet with enough  fiber to produce soft, painless bowel movements. High fiber diets may cause gas and bloating. However, these symptoms often go away within a few weeks, as your body adjusts. In many cases, dietary fiber may lessen IBS symptoms, particularly constipation. However, it may not help pain or diarrhea. High fiber diets keep the colon mildly enlarged (distended) with the added fiber. This may help prevent spasms in the colon. Some forms of fiber also keep water in the stool, thereby preventing hard stools that are difficult to pass.  Besides telling you to eat more foods with fiber, your caregiver may also tell you to get more fiber by taking a fiber pill or drinking water mixed with a special high fiber powder. An example of this is a natural fiber laxative containing psyllium seed.  TIPS  Large meals can cause cramping and diarrhea in people with IBS. If this happens to you, try eating 4 or 5 small meals a day,  or try eating less at each of your usual 3 meals. It may also help if your meals are low in fat and high in carbohydrates. Examples of carbohydrates are pasta, rice, whole-grain breads and cereals, fruits, and vegetables.  If dairy products cause your symptoms to flare up, you can try eating less of those foods. You might be able to handle yogurt better than other dairy products, because it contains bacteria that helps with digestion. Dairy products are an important source of calcium and other nutrients. If you need to avoid dairy products, be sure to talk with a Registered Dietitian about getting these nutrients through other food sources.  Drink enough water and fluids to keep your urine clear or pale yellow. This is important, especially if you have diarrhea. FOR MORE INFORMATION  International Foundation for Functional Gastrointestinal Disorders: www.iffgd.org  National Digestive Diseases Information Clearinghouse: digestive.AmenCredit.is Document Released: 03/12/2003 Document Revised: 03/14/2011 Document Reviewed: 03/22/2013 [facility] Patient Information 2015 [place], [place]. This information is not intended to replace advice given to you by your health care provider. Make sure you discuss any questions you have with your health care provider. Diet and Irritable Bowel Syndrome  No cure has been found for irritable bowel syndrome (IBS). Many options are available to treat the symptoms. Your caregiver will give you the best treatments available for your symptoms. He or she will also encourage you to manage stress and to make changes to your diet. You need to work with your caregiver and Registered Dietician to find the best combination of medicine, diet, counseling, and support to control your symptoms. The following are some diet suggestions. FOODS THAT MAKE IBS WORSE  Fatty foods, such as [place] fries.  Milk products, such as cheese or ice cream.  Chocolate.  Alcohol.  Caffeine (found in  coffee and some sodas).  Carbonated drinks, such as soda. If certain foods cause symptoms, you should eat less of them or stop eating them. FOOD JOURNAL   Keep a journal of the foods that seem to cause distress. Write down:  What you are eating during the day and when.  What problems you are having after eating.  When the symptoms occur in relation to your meals.  What foods always make you feel badly.  Take your notes with you to your caregiver to see if you should stop eating certain foods. FOODS THAT MAKE IBS BETTER Fiber reduces IBS symptoms, especially constipation, because it makes stools soft, bulky, and easier to pass. Fiber is found in bran, bread, cereal, beans, fruit, and  vegetables. Examples of foods with fiber include:  Apples.  Peaches.  Pears.  Berries.  Figs.  Broccoli, raw.  Cabbage.  Carrots.  Raw peas.  Kidney beans.  Lima beans.  Whole-grain bread.  Whole-grain cereal. Add foods with fiber to your diet a little at a time. This will let your body get used to them. Too much fiber at once might cause gas and swelling of your abdomen. This can trigger symptoms in a person with IBS. Caregivers usually recommend a diet with enough fiber to produce soft, painless bowel movements. High fiber diets may cause gas and bloating. However, these symptoms often go away within a few weeks, as your body adjusts. In many cases, dietary fiber may lessen IBS symptoms, particularly constipation. However, it may not help pain or diarrhea. High fiber diets keep the colon mildly enlarged (distended) with the added fiber. This may help prevent spasms in the colon. Some forms of fiber also keep water in the stool, thereby preventing hard stools that are difficult to pass.  Besides telling you to eat more foods with fiber, your caregiver may also tell you to get more fiber by taking a fiber pill or drinking water mixed with a special high fiber powder. An example of this is a  natural fiber laxative containing psyllium seed.  TIPS  Large meals can cause cramping and diarrhea in people with IBS. If this happens to you, try eating 4 or 5 small meals a day, or try eating less at each of your usual 3 meals. It may also help if your meals are low in fat and high in carbohydrates. Examples of carbohydrates are pasta, rice, whole-grain breads and cereals, fruits, and vegetables.  If dairy products cause your symptoms to flare up, you can try eating less of those foods. You might be able to handle yogurt better than other dairy products, because it contains bacteria that helps with digestion. Dairy products are an important source of calcium and other nutrients. If you need to avoid dairy products, be sure to talk with a Registered Dietitian about getting these nutrients through other food sources.  Drink enough water and fluids to keep your urine clear or pale yellow. This is important, especially if you have diarrhea. FOR MORE INFORMATION  International Foundation for Functional Gastrointestinal Disorders: www.iffgd.org  National Digestive Diseases Information Clearinghouse: digestive.AmenCredit.is Document Released: 03/12/2003 Document Revised: 03/14/2011 Document Reviewed: 03/22/2013 [facility] Patient Information 2015 [place], [place]. This information is not intended to replace advice given to you by your health care provider. Make sure you discuss any questions you have with your health care provider.

## 2013-12-02 NOTE — Progress Notes (Signed)
Patient ID: Margaret Davis, female   DOB: 22-Dec-1970, 43 y.o.   MRN: [id number]  Chief Complaint  Patient presents with  . Follow-up    HPI Margaret Davis is a 43 y.o. female.  The recent U/S result was reviewed.  HPI  Past Medical History  Diagnosis Date  . Syncope     PT STATES HER HEART AND BRAIN "MISFIRE" IF OVERLY HEATED OR UPSET--AND PT WOULD PASS OUT --NO EPISODES SINCE 2000  - NO MEDICATIONS--PT NO LONGER SEES CARDIOLOGIST  . GERD (gastroesophageal reflux disease)     OCCASIONAL  . Mass     RIGHT OVARY  --ABDOMINAL PAIN  . Panic attacks     Past Surgical History  Procedure Laterality Date  . Wisdom tooth extraction    . Abdominal hysterectomy      Family History  Problem Relation Age of Onset  . Diabetes Mother   . Hypertension Father     Social History History  Substance Use Topics  . Smoking status: Never Smoker   . Smokeless tobacco: Never Used  . Alcohol Use: 0.0 oz/week    0 Not specified per week     Comment: RARELY    Allergies  Allergen Reactions  . Latex Hives    CONTACT WITH LATEX CAUSES HIVES  . Other     CAN'T EAT ORANGES-BUT CAN DRINK OJ  . Phenobarbital     AS ACHILD--THOUGHT TO BE HAVING SEIZURES--HAD ALLERGIC REACTION TO PHENOBARBITAL.   LATER TOLD SHE DID NOT HAVE SEIZURES    Current Outpatient Prescriptions  Medication Sig Dispense Refill  . cholecalciferol (VITAMIN D) 1000 UNITS tablet Take 1,000 Units by mouth daily.    Marland Kitchen co-enzyme Q-10 30 MG capsule Take 200 mg by mouth daily.     Marland Kitchen esomeprazole (NEXIUM) 40 MG capsule Take 40 mg by mouth. ONCE A DAY  IF NEEDED    . loratadine (CLARITIN) 10 MG tablet Take 10 mg by mouth daily.    . simvastatin (ZOCOR) 20 MG tablet Take 20 mg by mouth at bedtime.    . phentermine 37.5 MG capsule Take 37.5 mg by mouth every morning.     No current facility-administered medications for this visit.    Review of Systems Review of Systems Constitutional: negative for fatigue and weight  loss Respiratory: negative for cough and wheezing Cardiovascular: negative for chest pain, fatigue and palpitations Gastrointestinal: negative for abdominal pain and change in bowel habits Genitourinary: positive for pelvic pain Integument/breast: negative for nipple discharge Musculoskeletal:negative for myalgias Neurological: negative for gait problems and tremors Behavioral/Psych: negative for abusive relationship, depression Endocrine: negative for temperature intolerance     Blood pressure 132/95, pulse 72, temperature 98.4 F (36.9 C), height 5\' 4"  (1.626 m), weight 97.977 kg (216 lb), last menstrual period 06/04/2011.  Physical Exam Physical Exam   50% of 15 min visit spent on counseling and coordination of care.   Data Reviewed Pelvic U/S  Assessment    Normal pelvic U/S--?functional bowel d/o    Plan     Follow up as needed or in 6 mths        JACKSON-MOORE,Toree Edling A 12/02/2013, 9:41 PM

## 2013-12-30 ENCOUNTER — Encounter: Payer: Self-pay | Admitting: *Deleted

## 2013-12-31 ENCOUNTER — Encounter: Payer: Self-pay | Admitting: Obstetrics & Gynecology

## 2014-07-02 ENCOUNTER — Ambulatory Visit: Admitting: Obstetrics & Gynecology

## 2015-10-15 ENCOUNTER — Emergency Department (HOSPITAL_BASED_OUTPATIENT_CLINIC_OR_DEPARTMENT_OTHER): Payer: Worker's Compensation

## 2015-10-15 ENCOUNTER — Emergency Department (HOSPITAL_BASED_OUTPATIENT_CLINIC_OR_DEPARTMENT_OTHER)
Admission: EM | Admit: 2015-10-15 | Discharge: 2015-10-16 | Disposition: A | Discharge status: Home / Self Care | Payer: Worker's Compensation | Attending: Emergency Medicine | Admitting: Emergency Medicine

## 2015-10-15 ENCOUNTER — Encounter (HOSPITAL_BASED_OUTPATIENT_CLINIC_OR_DEPARTMENT_OTHER): Payer: Self-pay

## 2015-10-15 DIAGNOSIS — Y990 Civilian activity done for income or pay: Secondary | ICD-10-CM | POA: Insufficient documentation

## 2015-10-15 DIAGNOSIS — S99921A Unspecified injury of right foot, initial encounter: Secondary | ICD-10-CM | POA: Diagnosis present

## 2015-10-15 DIAGNOSIS — Z79899 Other long term (current) drug therapy: Secondary | ICD-10-CM | POA: Diagnosis not present

## 2015-10-15 DIAGNOSIS — S90121A Contusion of right lesser toe(s) without damage to nail, initial encounter: Secondary | ICD-10-CM | POA: Insufficient documentation

## 2015-10-15 DIAGNOSIS — Y9389 Activity, other specified: Secondary | ICD-10-CM | POA: Diagnosis not present

## 2015-10-15 DIAGNOSIS — Y929 Unspecified place or not applicable: Secondary | ICD-10-CM | POA: Insufficient documentation

## 2015-10-15 DIAGNOSIS — W208XXA Other cause of strike by thrown, projected or falling object, initial encounter: Secondary | ICD-10-CM | POA: Insufficient documentation

## 2015-10-15 MED ORDER — TRAMADOL HCL 50 MG PO TABS: 50.0000 mg | ORAL_TABLET | Freq: Four times a day (QID) | ORAL | 0 refills | Status: DC | PRN

## 2015-10-15 MED ORDER — IBUPROFEN 800 MG PO TABS: 800.0000 mg | ORAL_TABLET | Freq: Three times a day (TID) | ORAL | 0 refills | Status: DC

## 2015-10-15 NOTE — ED Triage Notes (Signed)
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

## 2015-10-15 NOTE — ED Provider Notes (Signed)
[facility] DEPT [facility] Provider Note   CSN: [id number] Arrival date & time: 10/15/15  1839   By signing my name below, I, Dolores Hoose, attest that this documentation has been prepared under the direction and in the presence of non-physician practitioner, Delsa Grana, PA-C. Electronically Signed: Dolores Hoose, Scribe. 10/15/2015. 6:59 PM.  History   Chief Complaint Chief Complaint  Patient presents with  . Toe Injury   HPI  HPI Comments:  Margaret Davis is a 45 y.o. female who presents to the Emergency Department complaining of sudden-onset constant right fifth toe pain beginning this morning. Pt reports that she was moving some furniture when an electric pencil sharpener fell onto her foot. Her pain is worsened by palpation and by walking and she he has tried ice on the area with minimal relief. Pt notes associated swelling to the area as well as her other toes on her right foot. She denies any weakness or numbness to the area.   Past Medical History:  Diagnosis Date  . GERD (gastroesophageal reflux disease)    OCCASIONAL  . Mass    RIGHT OVARY  --ABDOMINAL PAIN  . Panic attacks   . Syncope    PT STATES HER HEART AND BRAIN "MISFIRE" IF OVERLY HEATED OR UPSET--AND PT WOULD PASS OUT --NO EPISODES SINCE 2000  - NO MEDICATIONS--PT NO LONGER SEES CARDIOLOGIST    Patient Active Problem List   Diagnosis Date Noted  . OSA (obstructive sleep apnea) 02/04/2013  . Obesity (BMI 30-39.9) 02/04/2013  . Syncope   . GERD (gastroesophageal reflux disease)   . Panic attacks   . Routine gynecological examination 06/22/2012    Past Surgical History:  Procedure Laterality Date  . ABDOMINAL HYSTERECTOMY    . WISDOM TOOTH EXTRACTION      OB History    Gravida Para Term Preterm AB Living   3 2 2   1 2    SAB TAB Ectopic Multiple Live Births   1       2       Home Medications    Prior to Admission medications   Medication Sig Start Date End Date Taking? Authorizing Provider    clonazePAM (KLONOPIN) 0.5 MG tablet Take 0.5 mg by mouth as needed for anxiety.   Yes Historical Provider, MD  cholecalciferol (VITAMIN D) 1000 UNITS tablet Take 1,000 Units by mouth daily.    Historical Provider, MD  co-enzyme Q-10 30 MG capsule Take 200 mg by mouth daily.     Historical Provider, MD  esomeprazole (NEXIUM) 40 MG capsule Take 40 mg by mouth. ONCE A DAY  IF NEEDED    Historical Provider, MD  loratadine (CLARITIN) 10 MG tablet Take 10 mg by mouth daily.    Historical Provider, MD  simvastatin (ZOCOR) 20 MG tablet Take 20 mg by mouth at bedtime.    Historical Provider, MD    Family History Family History  Problem Relation Age of Onset  . Diabetes Mother   . Hypertension Father     Social History Social History  Substance Use Topics  . Smoking status: Never Smoker  . Smokeless tobacco: Never Used  . Alcohol use No     Allergies   Latex; Other; and Phenobarbital   Review of Systems Review of Systems  Musculoskeletal: Positive for arthralgias and joint swelling.  Neurological: Negative for weakness and numbness.  All other systems reviewed and are negative.    Physical Exam Updated Vital Signs BP 133/68 (BP Location: Left Arm)  Pulse 78   Temp 98.5 F (36.9 C) (Oral)   Resp 20   Ht 5\' 4"  (1.626 m)   Wt 207 lb (93.9 kg)   LMP 06/04/2011   SpO2 100%   BMI 35.53 kg/m   Physical Exam  Constitutional: She is oriented to person, place, and time. She appears well-developed and well-nourished. No distress.  HENT:  Head: Normocephalic and atraumatic.  Eyes: Conjunctivae are normal.  Cardiovascular: Normal rate.   Pulmonary/Chest: Effort normal.  Abdominal: She exhibits no distension.  Musculoskeletal: Normal range of motion. She exhibits edema and tenderness. She exhibits no deformity.  Mildly edematous right fifth toe. Generalized tenderness. No bruising. No subungual hematoma. Normal sensation and cap refill. No dorsal foot tenderness. Normal ROM of  foot and toes.   Neurological: She is alert and oriented to person, place, and time.  Skin: Skin is warm and dry.  Psychiatric: She has a normal mood and affect.  Nursing note and vitals reviewed.    ED Treatments / Results  DIAGNOSTIC STUDIES:  Oxygen Saturation is 100% on RA, normal by my interpretation.    COORDINATION OF CARE:  8:26 PM Discussed treatment plan with pt at bedside which includes rest and otc painkillers and pt agreed to plan.  Labs (all labs ordered are listed, but only abnormal results are displayed) Labs Reviewed - No data to display  EKG  EKG Interpretation None       Radiology Dg Toe 5th Right  Result Date: 10/15/2015 CLINICAL DATA:  Pencil sharpener fell on right fifth toe, with pain. Initial encounter. EXAM: RIGHT FIFTH TOE COMPARISON:  None. FINDINGS: There is no evidence of fracture or dislocation. The fifth toe is grossly unremarkable in appearance. Visualized joint spaces are preserved. No definite soft tissue abnormalities are characterized on radiograph. IMPRESSION: No evidence of fracture or dislocation. Electronically Signed   By: Garald Balding M.D.   On: 10/15/2015 19:38    Procedures Procedures (including critical care time)  Medications Ordered in ED Medications - No data to display   Initial Impression / Assessment and Plan / ED Course  I have reviewed the triage vital signs and the nursing notes.  Pertinent labs & imaging results that were available during my care of the patient were reviewed by me and considered in my medical decision making (see chart for details).  Clinical Course   Patient X-Ray negative for obvious fracture or dislocation. Patient had no subungual hematoma.  Patient given crutches while in ED, conservative therapy (RICE tx) recommended and discussed. Patient will be discharged home & is agreeable with above plan. Returns precautions discussed. Pt appears safe for discharge.   Final Clinical Impressions(s)  / ED Diagnoses   Final diagnoses:  Contusion of fifth toe, right, initial encounter    New Prescriptions Discharge Medication List as of 10/15/2015  8:31 PM    START taking these medications   Details  ibuprofen (ADVIL,MOTRIN) 800 MG tablet Take 1 tablet (800 mg total) by mouth 3 (three) times daily., Starting Thu 10/15/2015, Print    traMADol (ULTRAM) 50 MG tablet Take 1 tablet (50 mg total) by mouth every 6 (six) hours as needed., Starting Thu 10/15/2015, Print       I personally performed the services described in this documentation, which was scribed in my presence. The recorded information has been reviewed and is accurate.      Delsa Grana, PA-C 10/17/15 [place], MD 10/17/15 1041

## 2019-01-02 ENCOUNTER — Ambulatory Visit: Attending: Internal Medicine

## 2019-01-02 DIAGNOSIS — U071 COVID-19: Secondary | ICD-10-CM

## 2019-01-02 DIAGNOSIS — R238 Other skin changes: Secondary | ICD-10-CM

## 2019-01-03 ENCOUNTER — Encounter: Payer: Self-pay | Admitting: *Deleted

## 2019-01-03 LAB — NOVEL CORONAVIRUS, NAA: SARS-CoV-2, NAA: DETECTED — AB

## 2019-01-05 ENCOUNTER — Telehealth: Payer: Self-pay | Admitting: Infectious Diseases

## 2019-01-05 NOTE — Telephone Encounter (Signed)
Called to discuss with patient about Covid symptoms and the use of bamlanivimab, a monoclonal antibody infusion for those with mild to moderate Covid symptoms and at a high risk of hospitalization.  Pt is qualified for this infusion at the [facility] infusion center due to BMI>35.   Symptoms started Sunday 12/30/18 She continues to have congestion

## 2019-01-14 ENCOUNTER — Telehealth: Payer: Self-pay | Admitting: *Deleted

## 2019-01-14 NOTE — Telephone Encounter (Signed)
Could you please schedule this patient and her husband with Dr. Lorelei Pont both as new patients

## 2019-01-14 NOTE — Telephone Encounter (Signed)
Copied from [place] [id number]. Topic: General - Other >> Jan 14, 2019  9:24 AM Margaret Davis wrote: Patient would like Davis callback in regards to Dr. Lorelei Pont taking her and her husband on  on as new patients. Please advise

## 2019-01-14 NOTE — Telephone Encounter (Signed)
Please give her a call back- she is not a patient of mine.  How can we help?  Thank you

## 2019-01-14 NOTE — Telephone Encounter (Signed)
Called the patient and she and her husband both are not happy where they are at and would like for you to take them on as new Patients.  She stated her friend who is your patient sent you a message regarding taking them both on as new patients. Let me know if ok with this and will let the schedulers know to schedule then NP appts

## 2019-01-14 NOTE — Telephone Encounter (Signed)
Ok sure, that is fine

## 2019-01-16 ENCOUNTER — Encounter: Payer: Self-pay | Admitting: Family Medicine

## 2019-01-16 ENCOUNTER — Other Ambulatory Visit: Payer: Self-pay

## 2019-01-16 ENCOUNTER — Ambulatory Visit (INDEPENDENT_AMBULATORY_CARE_PROVIDER_SITE_OTHER): Admitting: Family Medicine

## 2019-01-16 DIAGNOSIS — R059 Cough, unspecified: Secondary | ICD-10-CM

## 2019-01-16 DIAGNOSIS — U071 COVID-19: Secondary | ICD-10-CM | POA: Diagnosis not present

## 2019-01-16 DIAGNOSIS — R05 Cough: Secondary | ICD-10-CM

## 2019-01-16 MED ORDER — DOXYCYCLINE HYCLATE 100 MG PO CAPS: 100.0000 mg | ORAL_CAPSULE | Freq: Two times a day (BID) | ORAL | 0 refills | Status: DC

## 2019-01-16 NOTE — Progress Notes (Signed)
[name] at [facility] [address] [place], [place] [address] [phone]  Date:  01/16/2019   Name:  Margaret Davis   DOB:  1970-06-25   MRN:  [id number]  PCP:  Suella Broad, FNP    Chief Complaint: No chief complaint on file.   History of Present Illness:  Margaret Davis is a 49 y.o. very pleasant female patient who presents with the following:  Virtual visit today to establish care with new patient Patient location is home, provider location is office Patient identity confirmed with 2 factors, she gives consent for virtual visit today The patient and myself are present on the phone call today  Her mother visited them from [facility] over the holidays and got sick- she tested positive for covid 19 after Christmas.  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  Her daughter is mildly symptomatic, her son seems to be fine  The entire family is currently positive for covid 14  [name] notes cough, fatigue She is not having a fever generally- she had a temp just once during her illness She has noted head congestion, some chest congestion, runny nose She does have loss of taste and smell sense  Diarrhea, no vomiting  She is taking zyrtec for allergies and zocor, klonpin as needed, nexium OTC   Patient Active Problem List   Diagnosis Date Noted  . OSA (obstructive sleep apnea) 02/04/2013  . Obesity (BMI 30-39.9) 02/04/2013  . Syncope   . GERD (gastroesophageal reflux  disease)   . Panic attacks   . Routine gynecological examination 06/22/2012    Past Medical History:  Diagnosis Date  . GERD (gastroesophageal reflux disease)    OCCASIONAL  . Mass    RIGHT OVARY  --ABDOMINAL PAIN  . Panic attacks   . Syncope    PT STATES HER HEART AND BRAIN "MISFIRE" IF OVERLY HEATED OR UPSET--AND PT WOULD PASS OUT --NO EPISODES SINCE 2000  - NO MEDICATIONS--PT NO LONGER SEES CARDIOLOGIST    Past Surgical History:  Procedure Laterality Date  . ABDOMINAL HYSTERECTOMY    . WISDOM TOOTH EXTRACTION      Social History   Tobacco Use  . Smoking status: Never Smoker  . Smokeless tobacco: Never Used  Substance Use Topics  . Alcohol use: No  . Drug use: No    Family History  Problem Relation Age of Onset  . Diabetes Mother   . Hypertension Father     Allergies  Allergen Reactions  . Latex Hives    CONTACT WITH LATEX CAUSES HIVES  . Other     CAN'T EAT ORANGES-BUT CAN DRINK OJ  . Phenobarbital     AS ACHILD--THOUGHT TO BE HAVING SEIZURES--HAD ALLERGIC REACTION TO PHENOBARBITAL.   LATER TOLD SHE DID NOT HAVE SEIZURES    Medication list has been reviewed and updated.  Current Outpatient Medications on File Prior to Visit  Medication Sig Dispense Refill  .  cholecalciferol (VITAMIN D) 1000 UNITS tablet Take 1,000 Units by mouth daily.    . clonazePAM (KLONOPIN) 0.5 MG tablet Take 0.5 mg by mouth as needed for anxiety.    Marland Kitchen co-enzyme Q-10 30 MG capsule Take 200 mg by mouth daily.     Marland Kitchen esomeprazole (NEXIUM) 40 MG capsule Take 40 mg by mouth. ONCE A DAY  IF NEEDED    . ibuprofen (ADVIL,MOTRIN) 800 MG tablet Take 1 tablet (800 mg total) by mouth 3 (three) times daily. 21 tablet 0  . loratadine (CLARITIN) 10 MG tablet Take 10 mg by mouth daily.    . simvastatin (ZOCOR) 20 MG tablet Take 20 mg by mouth at bedtime.    . traMADol (ULTRAM) 50 MG tablet Take 1 tablet (50 mg total) by mouth every 6 (six) hours as needed. 10 tablet 0   No current  facility-administered medications on file prior to visit.    Review of Systems:  As per HPI- otherwise negative.   Physical Examination: There were no vitals filed for this visit. There were no vitals filed for this visit. There is no height or weight on file to calculate BMI. Ideal Body Weight:    Pt observed over video-she looks well, somewhat fatigued but no distress.  No cough or shortness of breath is observed  Assessment and Plan: Cough - Plan: doxycycline (VIBRAMYCIN) 100 MG capsule  COVID-19  New patient visit today, virtual.  Patient diagnosed with COVID-19 approximately 14 days ago.  She is not toxic, but continues to suffer from fatigue and cough.  She is somewhat concerned about a secondary bacterial infection.  I agree that an antibiotic would be reasonable at this time, called in a course of doxycycline for her.  I advised her to seek care at the emergency room if she is worsening.  Alternatively, I am able to see her in the office next week when she is 21 days past onset of symptoms  She will continue to rest and use over-the-counter medications as needed, will contact me if I can do anything to help Moderate medical decision making today  Signed Lamar Blinks, MD

## 2019-02-10 ENCOUNTER — Encounter: Payer: Self-pay | Admitting: Family Medicine

## 2019-02-13 ENCOUNTER — Other Ambulatory Visit: Payer: Self-pay

## 2019-02-14 ENCOUNTER — Encounter: Payer: Self-pay | Admitting: Family Medicine

## 2019-02-14 ENCOUNTER — Ambulatory Visit (INDEPENDENT_AMBULATORY_CARE_PROVIDER_SITE_OTHER): Admitting: Family Medicine

## 2019-02-14 ENCOUNTER — Ambulatory Visit (HOSPITAL_BASED_OUTPATIENT_CLINIC_OR_DEPARTMENT_OTHER)
Admission: RE | Admit: 2019-02-14 | Discharge: 2019-02-14 | Disposition: A | Discharge status: Home / Self Care | Source: Ambulatory Visit | Attending: Family Medicine | Admitting: Family Medicine

## 2019-02-14 ENCOUNTER — Other Ambulatory Visit: Payer: Self-pay

## 2019-02-14 VITALS — BP 114/80 | HR 89 | Temp 96.8°F | Resp 16 | Ht 64.0 in | Wt 229.0 lb

## 2019-02-14 DIAGNOSIS — R0602 Shortness of breath: Secondary | ICD-10-CM

## 2019-02-14 DIAGNOSIS — J189 Pneumonia, unspecified organism: Secondary | ICD-10-CM | POA: Diagnosis not present

## 2019-02-14 DIAGNOSIS — R0789 Other chest pain: Secondary | ICD-10-CM | POA: Diagnosis present

## 2019-02-14 DIAGNOSIS — Z23 Encounter for immunization: Secondary | ICD-10-CM

## 2019-02-14 DIAGNOSIS — E782 Mixed hyperlipidemia: Secondary | ICD-10-CM

## 2019-02-14 DIAGNOSIS — E785 Hyperlipidemia, unspecified: Secondary | ICD-10-CM | POA: Insufficient documentation

## 2019-02-14 NOTE — Patient Instructions (Addendum)
It was great to meet you in person today I will be in touch with your chest film results and labs.  If your D dimer is elevated, we will want to order a CT angiogram of your chest to ensure no blood clot.  If you are getting acutely worse in the meantime please seek care!   If your D dimer is normal and chest is clear, you likely have residual symptoms due to covid 19 that will hopefully resolve fully in time

## 2019-02-14 NOTE — Progress Notes (Addendum)
[place] at [facility] [address] [place], [place] [address] [phone]  Date:  02/14/2019   Name:  Margaret Davis   DOB:  [birth year]   MRN:  [id number]  PCP:  Darreld Mclean, MD    Chief Complaint: Cough (covid follow up, chest congestion, cough comes and goes) and Chest Xray   History of Present Illness:  Margaret Davis is a 49 y.o. very pleasant female patient who presents with the following:  Here today for follow-up visit History of sleep apnea, obesity.  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[employer]  At her visit about 1 month ago I prescribed doxycycline for any secondary bacterial infection She is here today she continues to experience cough and fatigue, would like some further evaluation  She finished her abx and the cough did seem to resolve for about a week but then came back The cough is generally dry No fever noted - Tmax 98.7 over the last week or so She does feel SOB with activity She will get out of breath more easily with talking or physical activity No chest pain at all  No LE swelling No PE or DVT history  Never a smoker  No hormones   Her husband is still tired at times, her children are doing ok   Flu shot complete Pap- Lahoma Crocker does her pap  Tetanus- she thinks in 2009- tdap today  No recent blood work on chart, can offer today-Per patient she did have routine labs in approximately October mammo per [facility]   Her cardiologist is at [facility] cardiology She has rare benign syncope, last in 2014 Cardiology work-up otherwise benign per patient report Patient Active Problem List   Diagnosis Date Noted  . Hyperlipidemia 02/14/2019  . OSA (obstructive sleep apnea) 02/04/2013  . Obesity  (BMI 30-39.9) 02/04/2013  . Syncope   . GERD (gastroesophageal reflux disease)   . Panic attacks   . Routine gynecological examination 06/22/2012    Past Medical History:  Diagnosis Date  . GERD (gastroesophageal reflux disease)    OCCASIONAL  . Mass    RIGHT OVARY  --ABDOMINAL PAIN  . Panic attacks   . Syncope    PT STATES HER HEART AND BRAIN "MISFIRE" IF OVERLY HEATED OR UPSET--AND PT WOULD PASS OUT --NO EPISODES SINCE 2000  - NO MEDICATIONS--PT NO LONGER SEES CARDIOLOGIST    Past Surgical History:  Procedure Laterality Date  . ABDOMINAL HYSTERECTOMY    . WISDOM TOOTH EXTRACTION      Social History   Tobacco Use  . Smoking status: Never Smoker  . Smokeless tobacco: Never Used  Substance Use Topics  . Alcohol use: No  . Drug use: No    Family History  Problem Relation Age of Onset  . Diabetes Mother   . Hypertension Father     Allergies  Allergen Reactions  . [place]    Patient reports she "can drink OJ without a problem."   . Latex Hives    CONTACT WITH LATEX CAUSES HIVES  . Other     CAN'T EAT ORANGES-BUT CAN DRINK OJ  . Phenobarbital     AS ACHILD--THOUGHT TO BE HAVING SEIZURES--HAD ALLERGIC REACTION TO PHENOBARBITAL.  LATER TOLD SHE DID NOT HAVE SEIZURES    Medication list has been reviewed and updated.  Current Outpatient Medications on File Prior to Visit  Medication Sig Dispense Refill  . cholecalciferol (VITAMIN D) 1000 UNITS tablet Take 1,000 Units by mouth daily.    . clonazePAM (KLONOPIN) 0.5 MG tablet Take 0.5 mg by mouth as needed for anxiety.    Marland Kitchen co-enzyme Q-10 30 MG capsule Take 200 mg by mouth daily.     Marland Kitchen esomeprazole (NEXIUM) 40 MG capsule Take 40 mg by mouth. ONCE A DAY  IF NEEDED    . ibuprofen (ADVIL,MOTRIN) 800 MG tablet Take 1 tablet (800 mg total) by mouth 3 (three) times daily. 21 tablet 0  . loratadine (CLARITIN) 10 MG tablet Take 10 mg by mouth daily.    . simvastatin (ZOCOR) 20 MG tablet Take 20 mg by mouth at  bedtime.     No current facility-administered medications on file prior to visit.    Review of Systems:  As per HPI- otherwise negative.   Physical Examination: Vitals:   02/14/19 1445  BP: 114/80  Pulse: 89  Resp: 16  Temp: (!) 96.8 F (36 C)  SpO2: 98%   Vitals:   02/14/19 1445  Weight: 229 lb (103.9 kg)  Height: 5\' 4"  (1.626 m)   Body mass index is 39.31 kg/m. Ideal Body Weight: Weight in (lb) to have BMI = 25: 145.3  GEN: no acute distress. Obese, looks well  HEENT: Atraumatic, Normocephalic.   PEERL Ears and Nose: No external deformity. CV: RRR, No M/G/R. No JVD. No thrill. No extra heart sounds. PULM: CTA B, no wheezes, crackles, rhonchi. No retractions. No resp. distress. No accessory muscle use. ABD: S, NT, ND, +BS. No rebound. No HSM. EXTR: No c/c/e.  No calf swelling or tenderness to suggest DVT PSYCH: Normally interactive. Conversant.   EKG:  NSR Compared with tracing 2014- no significant change noted  Assessment and Plan: Chest tightness - Plan: EKG 12-Lead, CBC, Comprehensive metabolic panel, D-Dimer, Quantitative, DG Chest 2 View  Mixed hyperlipidemia  Immunization due - Plan: Tdap vaccine greater than or equal to 7yo IM  Here today for in person follow-up visit Covid infection at the end of December She continues to feel fatigued and has some chest tightness Increased risk of pulmonary embolism with COVID-19, will check D-dimer today EKG reassuring Chest x-ray pending We will be in touch with patient pending her labs.  She understands that if positive D-dimer we will recommend a CT angiogram She is status post hysterectomy Moderate medical decision making today  This visit occurred during the SARS-CoV-2 public health emergency.  Safety protocols were in place, including screening questions prior to the visit, additional usage of staff PPE, and extensive cleaning of exam room while observing appropriate contact time as indicated for disinfecting  solutions.     Signed Lamar Blinks, MD  Received her D dimer 2/12- elevated Ordered CT angio, message to staff and to patient that she will need a CT angiogram today  Received results of blood work and CT scan at approximately 12:45 PM.  Call patient-no PE, she does have possible atypical pneumonia.  Will prescribe azithromycin  She states understanding, she will keep me posted on how she is feeling and let me know if not continuing to get better  Results for orders placed or performed in visit on 02/14/19  CBC  Result Value Ref Range   WBC 5.9 4.0 - 10.5 K/uL   RBC  4.32 3.87 - 5.11 Mil/uL   Platelets 389.0 150.0 - 400.0 K/uL   Hemoglobin 13.3 12.0 - 15.0 g/dL   HCT 40.0 36.0 - 46.0 %   MCV 92.5 78.0 - 100.0 fl   MCHC 33.2 30.0 - 36.0 g/dL   RDW 13.5 11.5 - 15.5 %  Comprehensive metabolic panel  Result Value Ref Range   Sodium 141 135 - 145 mEq/L   Potassium 3.6 3.5 - 5.1 mEq/L   Chloride 104 96 - 112 mEq/L   CO2 27 19 - 32 mEq/L   Glucose, Bld 89 70 - 99 mg/dL   BUN 9 6 - 23 mg/dL   Creatinine, Ser 0.75 0.40 - 1.20 mg/dL   Total Bilirubin 0.4 0.2 - 1.2 mg/dL   Alkaline Phosphatase 66 39 - 117 U/L   AST 15 0 - 37 U/L   ALT 17 0 - 35 U/L   Total Protein 7.6 6.0 - 8.3 g/dL   Albumin 4.5 3.5 - 5.2 g/dL   GFR 100.01 >60.00 mL/min   Calcium 9.7 8.4 - 10.5 mg/dL  D-Dimer, Quantitative  Result Value Ref Range   D-Dimer, Quant 0.62 (H) <0.50 mcg/mL FEU   DG Chest 2 View  Result Date: 02/14/2019 CLINICAL DATA:  Recent COVID positivity. EXAM: CHEST - 2 VIEW COMPARISON:  09/06/2012 FINDINGS: The cardiac silhouette, mediastinal and hilar contours are normal. The lungs are clear. No pleural effusions. No infiltrates. No pulmonary lesions. The bony thorax is intact. IMPRESSION: No acute cardiopulmonary findings. Electronically Signed   By: Marijo Sanes M.D.   On: 02/14/2019 15:48   CT Angio Chest W/Cm &/Or Wo Cm  Result Date: 02/15/2019 CLINICAL DATA:  Shortness of breath.  Recent COVID-19 positive. Elevated D-dimer study. EXAM: CT ANGIOGRAPHY CHEST WITH CONTRAST TECHNIQUE: Multidetector CT imaging of the chest was performed using the standard protocol during bolus administration of intravenous contrast. Multiplanar CT image reconstructions and MIPs were obtained to evaluate the vascular anatomy. CONTRAST:  66mL OMNIPAQUE IOHEXOL 350 MG/ML SOLN COMPARISON:  Chest radiograph February 14, 2019 FINDINGS: Cardiovascular: There is no demonstrable pulmonary embolus. There is no thoracic aortic aneurysm or dissection. The visualized great vessels appear unremarkable. There is no pericardial effusion or pericardial thickening. Mediastinum/Nodes: Thyroid appears borderline prominent and mildly inhomogeneous without dominant mass. No thoracic adenopathy evident. No esophageal lesions. Lungs/Pleura: There are scattered areas of atelectatic change in the bases. There is subtle ill-defined opacity in the posterior segment of the left upper lobe. There is no airspace consolidation. No pleural effusions evident bilaterally. Upper Abdomen: There is hepatic steatosis. Visualized upper abdominal structures otherwise appear unremarkable. Musculoskeletal: No blastic or lytic bone lesions. No evident chest wall lesions. Review of the MIP images confirms the above findings. IMPRESSION: 1. No demonstrable pulmonary embolus. No thoracic aortic aneurysm or dissection. 2. Subtle ill-defined opacity in the posterior segment left upper lobe which may represent earliest changes of atypical organism pneumonia. There is mild lower lung region atelectatic change. Lungs otherwise clear. No consolidation. No pleural effusions. 3.  No adenopathy evident. 4. Thyroid rather prominent and mildly inhomogeneous in appearance. No dominant thyroid mass. Given thyroid prominence, correlation with thyroid function laboratory values may be advisable. 5.  Hepatic steatosis. Electronically Signed   By: Lowella Grip III M.D.    On: 02/15/2019 11:16

## 2019-02-15 ENCOUNTER — Encounter: Payer: Self-pay | Admitting: Family Medicine

## 2019-02-15 ENCOUNTER — Encounter (HOSPITAL_BASED_OUTPATIENT_CLINIC_OR_DEPARTMENT_OTHER): Payer: Self-pay

## 2019-02-15 ENCOUNTER — Ambulatory Visit (HOSPITAL_BASED_OUTPATIENT_CLINIC_OR_DEPARTMENT_OTHER)
Admission: RE | Admit: 2019-02-15 | Discharge: 2019-02-15 | Disposition: A | Discharge status: Home / Self Care | Source: Ambulatory Visit | Attending: Family Medicine | Admitting: Family Medicine

## 2019-02-15 DIAGNOSIS — R0602 Shortness of breath: Secondary | ICD-10-CM | POA: Insufficient documentation

## 2019-02-15 DIAGNOSIS — E049 Nontoxic goiter, unspecified: Secondary | ICD-10-CM

## 2019-02-15 LAB — COMPREHENSIVE METABOLIC PANEL
ALT: 17 U/L (ref 0–35)
AST: 15 U/L (ref 0–37)
Albumin: 4.5 g/dL (ref 3.5–5.2)
Alkaline Phosphatase: 66 U/L (ref 39–117)
BUN: 9 mg/dL (ref 6–23)
CO2: 27 mEq/L (ref 19–32)
Calcium: 9.7 mg/dL (ref 8.4–10.5)
Chloride: 104 mEq/L (ref 96–112)
Creatinine, Ser: 0.75 mg/dL (ref 0.40–1.20)
GFR: 100.01 mL/min (ref >60.00)
Glucose, Bld: 89 mg/dL (ref 70–99)
Potassium: 3.6 mEq/L (ref 3.5–5.1)
Sodium: 141 mEq/L (ref 135–145)
Total Bilirubin: 0.4 mg/dL (ref 0.2–1.2)
Total Protein: 7.6 g/dL (ref 6.0–8.3)

## 2019-02-15 LAB — CBC
HCT: 40 % (ref 36.0–46.0)
Hemoglobin: 13.3 g/dL (ref 12.0–15.0)
MCHC: 33.2 g/dL (ref 30.0–36.0)
MCV: 92.5 fl (ref 78.0–100.0)
Platelets: 389 10*3/uL (ref 150.0–400.0)
RBC: 4.32 Mil/uL (ref 3.87–5.11)
RDW: 13.5 % (ref 11.5–15.5)
WBC: 5.9 10*3/uL (ref 4.0–10.5)

## 2019-02-15 LAB — D-DIMER, QUANTITATIVE: D-Dimer, Quant: 0.62 mcg/mL FEU — ABNORMAL HIGH (ref <0.50)

## 2019-02-15 MED ORDER — AZITHROMYCIN 250 MG PO TABS: ORAL_TABLET | ORAL | 0 refills | Status: DC

## 2019-02-15 MED ORDER — IOHEXOL 350 MG/ML SOLN
100.0000 mL | Freq: Once | INTRAVENOUS | Status: AC | PRN
  Administered 2019-02-15: 83 mL via INTRAVENOUS

## 2019-02-15 NOTE — Addendum Note (Signed)
Addended by: Lamar Blinks C on: 02/15/2019 07:10 AM   Modules accepted: Orders

## 2019-02-15 NOTE — Addendum Note (Signed)
Addended by: Lamar Blinks C on: 02/15/2019 12:54 PM   Modules accepted: Orders

## 2019-02-18 ENCOUNTER — Other Ambulatory Visit (INDEPENDENT_AMBULATORY_CARE_PROVIDER_SITE_OTHER)

## 2019-02-18 ENCOUNTER — Other Ambulatory Visit: Payer: Self-pay

## 2019-02-18 DIAGNOSIS — E049 Nontoxic goiter, unspecified: Secondary | ICD-10-CM | POA: Diagnosis not present

## 2019-02-19 LAB — T3, FREE: T3, Free: 3.9 pg/mL (ref 2.3–4.2)

## 2019-02-19 LAB — TSH: TSH: 1.25 u[IU]/mL (ref 0.35–4.50)

## 2019-02-19 NOTE — Addendum Note (Signed)
Addended by: Lamar Blinks C on: 02/19/2019 02:06 PM   Modules accepted: Orders

## 2019-02-20 ENCOUNTER — Ambulatory Visit (HOSPITAL_BASED_OUTPATIENT_CLINIC_OR_DEPARTMENT_OTHER)
Admission: RE | Admit: 2019-02-20 | Discharge: 2019-02-20 | Disposition: A | Discharge status: Home / Self Care | Source: Ambulatory Visit | Attending: Family Medicine | Admitting: Family Medicine

## 2019-02-20 ENCOUNTER — Other Ambulatory Visit: Payer: Self-pay

## 2019-02-20 DIAGNOSIS — E049 Nontoxic goiter, unspecified: Secondary | ICD-10-CM | POA: Diagnosis present

## 2019-02-21 ENCOUNTER — Encounter: Payer: Self-pay | Admitting: Family Medicine

## 2019-02-21 ENCOUNTER — Other Ambulatory Visit: Payer: Self-pay | Admitting: Family Medicine

## 2019-02-21 DIAGNOSIS — E049 Nontoxic goiter, unspecified: Secondary | ICD-10-CM

## 2019-02-22 MED ORDER — ALBUTEROL SULFATE HFA 108 (90 BASE) MCG/ACT IN AERS: 2.0000 | INHALATION_SPRAY | Freq: Four times a day (QID) | RESPIRATORY_TRACT | 1 refills | Status: DC | PRN

## 2019-02-26 ENCOUNTER — Encounter: Payer: Self-pay | Admitting: Family Medicine

## 2019-03-15 ENCOUNTER — Encounter: Payer: Self-pay | Admitting: Family Medicine

## 2019-03-16 NOTE — Patient Instructions (Signed)
It was good to see you again today, take care 
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
## 2019-03-18 ENCOUNTER — Encounter: Payer: Self-pay | Admitting: Family Medicine

## 2019-03-18 ENCOUNTER — Ambulatory Visit: Admitting: Family Medicine

## 2019-03-18 ENCOUNTER — Ambulatory Visit (INDEPENDENT_AMBULATORY_CARE_PROVIDER_SITE_OTHER): Admitting: Family Medicine

## 2019-03-18 ENCOUNTER — Other Ambulatory Visit: Payer: Self-pay

## 2019-03-18 DIAGNOSIS — Z1211 Encounter for screening for malignant neoplasm of colon: Secondary | ICD-10-CM | POA: Diagnosis not present

## 2019-03-18 DIAGNOSIS — J189 Pneumonia, unspecified organism: Secondary | ICD-10-CM | POA: Diagnosis not present

## 2019-03-18 MED ORDER — PREDNISONE 20 MG PO TABS: ORAL_TABLET | ORAL | 0 refills | Status: DC

## 2019-03-22 ENCOUNTER — Emergency Department (HOSPITAL_BASED_OUTPATIENT_CLINIC_OR_DEPARTMENT_OTHER)
Admission: EM | Admit: 2019-03-22 | Discharge: 2019-03-22 | Disposition: A | Discharge status: Home / Self Care | Payer: Worker's Compensation | Attending: Emergency Medicine | Admitting: Emergency Medicine

## 2019-03-22 ENCOUNTER — Encounter (HOSPITAL_BASED_OUTPATIENT_CLINIC_OR_DEPARTMENT_OTHER): Payer: Self-pay | Admitting: *Deleted

## 2019-03-22 ENCOUNTER — Other Ambulatory Visit: Payer: Self-pay

## 2019-03-22 ENCOUNTER — Encounter: Payer: Self-pay | Admitting: Family Medicine

## 2019-03-22 DIAGNOSIS — Y929 Unspecified place or not applicable: Secondary | ICD-10-CM | POA: Diagnosis not present

## 2019-03-22 DIAGNOSIS — Z91040 Latex allergy status: Secondary | ICD-10-CM | POA: Diagnosis not present

## 2019-03-22 DIAGNOSIS — W010XXA Fall on same level from slipping, tripping and stumbling without subsequent striking against object, initial encounter: Secondary | ICD-10-CM | POA: Insufficient documentation

## 2019-03-22 DIAGNOSIS — Z79899 Other long term (current) drug therapy: Secondary | ICD-10-CM | POA: Diagnosis not present

## 2019-03-22 DIAGNOSIS — Y999 Unspecified external cause status: Secondary | ICD-10-CM | POA: Diagnosis not present

## 2019-03-22 DIAGNOSIS — Y939 Activity, unspecified: Secondary | ICD-10-CM | POA: Diagnosis not present

## 2019-03-22 DIAGNOSIS — S300XXA Contusion of lower back and pelvis, initial encounter: Secondary | ICD-10-CM | POA: Diagnosis not present

## 2019-03-22 DIAGNOSIS — W19XXXA Unspecified fall, initial encounter: Secondary | ICD-10-CM

## 2019-03-22 DIAGNOSIS — S3992XA Unspecified injury of lower back, initial encounter: Secondary | ICD-10-CM | POA: Diagnosis present

## 2019-03-22 MED ORDER — IBUPROFEN 800 MG PO TABS: 800.0000 mg | ORAL_TABLET | Freq: Three times a day (TID) | ORAL | 0 refills | Status: DC | PRN

## 2019-03-22 MED ORDER — METHOCARBAMOL 500 MG PO TABS: 500.0000 mg | ORAL_TABLET | Freq: Four times a day (QID) | ORAL | 0 refills | Status: DC

## 2019-03-22 NOTE — ED Notes (Addendum)
Slipped at work this am  C/o left arm, shoulder,hand, hip, ankle, and lower back pain  Denies loc, pt using hand on phone

## 2019-03-22 NOTE — Discharge Instructions (Signed)
Return if any problems.

## 2019-03-22 NOTE — ED Provider Notes (Signed)
[facility] Provider Note   CSN: [id number] Arrival date & time: 03/22/19  1102     History Chief Complaint  Patient presents with  . Fall    Margaret Davis is a 49 y.o. female.  The history is provided by the patient. No language interpreter was used.  Fall This is a new problem. The current episode started less than 1 hour ago. The problem occurs constantly. The problem has not changed since onset.Nothing aggravates the symptoms. Nothing relieves the symptoms. She has tried nothing for the symptoms. The treatment provided no relief.  Pt reports she slipped on a rubber mat.  Pt complains of soreness in her low back, left shoulder and left ankle.  Pt complains of soreness.  She does not think she has broken anything.      Past Medical History:  Diagnosis Date  . GERD (gastroesophageal reflux disease)    OCCASIONAL  . Mass    RIGHT OVARY  --ABDOMINAL PAIN  . Panic attacks   . Syncope    PT STATES HER HEART AND BRAIN "MISFIRE" IF OVERLY HEATED OR UPSET--AND PT WOULD PASS OUT --NO EPISODES SINCE 2000  - NO MEDICATIONS--PT NO LONGER SEES CARDIOLOGIST    Patient Active Problem List   Diagnosis Date Noted  . Hyperlipidemia 02/14/2019  . OSA (obstructive sleep apnea) 02/04/2013  . Obesity (BMI 30-39.9) 02/04/2013  . Syncope   . GERD (gastroesophageal reflux disease)   . Panic attacks   . Routine gynecological examination 06/22/2012    Past Surgical History:  Procedure Laterality Date  . ABDOMINAL HYSTERECTOMY    . WISDOM TOOTH EXTRACTION       OB History    Gravida  3   Para  2   Term  2   Preterm      AB  1   Living  2     SAB  1   TAB      Ectopic      Multiple      Live Births  2           Family History  Problem Relation Age of Onset  . Diabetes Mother   . Hypertension Father     Social History   Tobacco Use  . Smoking status: Never Smoker  . Smokeless tobacco: Never Used  Substance Use Topics    . Alcohol use: No  . Drug use: No    Home Medications Prior to Admission medications   Medication Sig Start Date End Date Taking? Authorizing Provider  albuterol (VENTOLIN HFA) 108 (90 Base) MCG/ACT inhaler Inhale 2 puffs into the lungs every 6 (six) hours as needed for wheezing or shortness of breath. 02/22/19   Copland, Gay Filler, MD  cholecalciferol (VITAMIN D) 1000 UNITS tablet Take 1,000 Units by mouth daily.    [provider]  clonazePAM (KLONOPIN) 0.5 MG tablet Take 0.5 mg by mouth as needed for anxiety.    [provider]  co-enzyme Q-10 30 MG capsule Take 200 mg by mouth daily.     [provider]  esomeprazole (NEXIUM) 40 MG capsule Take 40 mg by mouth. ONCE A DAY  IF NEEDED    [provider]  ibuprofen (ADVIL) 800 MG tablet Take 1 tablet (800 mg total) by mouth every 8 (eight) hours as needed. 03/22/19   Fransico Meadow, PA-C  ibuprofen (ADVIL,MOTRIN) 800 MG tablet Take 1 tablet (800 mg total) by mouth 3 (three) times daily. 10/15/15  Delsa Grana, PA-C  loratadine (CLARITIN) 10 MG tablet Take 10 mg by mouth daily.    [provider]  methocarbamol (ROBAXIN) 500 MG tablet Take 1 tablet (500 mg total) by mouth 4 (four) times daily. 03/22/19   Fransico Meadow, PA-C  predniSONE (DELTASONE) 20 MG tablet Take 2 pills daily for 3 days, then 1 pill daily for 3 days 03/18/19   Copland, Gay Filler, MD  simvastatin (ZOCOR) 20 MG tablet Take 20 mg by mouth at bedtime.    [provider]    Allergies    Orange oil, Latex, Other, and Phenobarbital  Review of Systems   Review of Systems  All other systems reviewed and are negative.   Physical Exam Updated Vital Signs BP (!) 131/92 (BP Location: Right Arm)   Pulse 73   Temp 98.9 F (37.2 C) (Oral)   Resp 14   LMP 06/04/2011   SpO2 98%   Physical Exam Vitals reviewed.  HENT:     Head: Normocephalic and atraumatic.  Cardiovascular:     Rate and Rhythm: Normal rate.   Pulmonary:     Effort: Pulmonary effort is normal.  Abdominal:     General: Abdomen is flat.  Musculoskeletal:        General: No swelling or tenderness. Normal range of motion.     Cervical back: Normal range of motion.  Skin:    General: Skin is warm.  Neurological:     General: No focal deficit present.     Mental Status: She is alert.  Psychiatric:        Mood and Affect: Mood normal.     ED Results / Procedures / Treatments   Labs (all labs ordered are listed, but only abnormal results are displayed) Labs Reviewed - No data to display  EKG None  Radiology No results found.  Procedures Procedures (including critical care time)  Medications Ordered in ED Medications - No data to display  ED Course  I have reviewed the triage vital signs and the nursing notes.  Pertinent labs & imaging results that were available during my care of the patient were reviewed by me and considered in my medical decision making (see chart for details).    MDM Rules/Calculators/A&P                      MDM:  Pt does not seem to have any broken bones,  Pt able to ambulate and bend.  Pt advised she may have some soreness.  Pt given rx for ibuprofen and robaxin  Final Clinical Impression(s) / ED Diagnoses Final diagnoses:  Fall, initial encounter  Contusion of lower back, initial encounter    Rx / DC Orders ED Discharge Orders         Ordered    ibuprofen (ADVIL) 800 MG tablet  Every 8 hours PRN     03/22/19 1144    methocarbamol (ROBAXIN) 500 MG tablet  4 times daily     03/22/19 1144        An After Visit Summary was printed and given to the patient.    Fransico Meadow, PA-C 03/22/19 1231    Drenda Freeze, MD 03/22/19 [phone]

## 2019-03-22 NOTE — ED Triage Notes (Signed)
She slipped and fell when she stepped on a rug while at work this am. [employer] comp. No UDS required.

## 2019-03-27 ENCOUNTER — Encounter: Payer: Self-pay | Admitting: Family Medicine

## 2019-03-27 ENCOUNTER — Other Ambulatory Visit: Payer: Self-pay

## 2019-03-28 ENCOUNTER — Ambulatory Visit (INDEPENDENT_AMBULATORY_CARE_PROVIDER_SITE_OTHER): Admitting: Internal Medicine

## 2019-03-28 ENCOUNTER — Encounter: Payer: Self-pay | Admitting: Internal Medicine

## 2019-03-28 ENCOUNTER — Other Ambulatory Visit: Payer: Self-pay

## 2019-03-28 VITALS — BP 139/84 | HR 80 | Temp 97.4°F | Resp 16 | Ht 64.0 in | Wt 228.0 lb

## 2019-03-28 DIAGNOSIS — T22212A Burn of second degree of left forearm, initial encounter: Secondary | ICD-10-CM

## 2019-03-28 MED ORDER — DOXYCYCLINE HYCLATE 100 MG PO TABS: 100.0000 mg | ORAL_TABLET | Freq: Two times a day (BID) | ORAL | 0 refills | Status: DC

## 2019-03-28 MED ORDER — SILVER SULFADIAZINE 1 % EX CREA: 1.0000 "application " | TOPICAL_CREAM | Freq: Every day | CUTANEOUS | 0 refills | Status: DC

## 2019-03-28 NOTE — Patient Instructions (Signed)
Apply the cream twice a day for 1 week  Take the antibiotics as prescribed  Keep the area clean and dry  If the redness is not improving please let me know.  If you have fever chills or the redness gets worse: Call the office

## 2019-03-28 NOTE — Progress Notes (Signed)
Pre visit review using our clinic review tool, if applicable. No additional management support is needed unless otherwise documented below in the visit note. 
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
## 2019-04-04 HISTORY — PX: COLONOSCOPY: SHX174

## 2019-05-02 ENCOUNTER — Encounter: Payer: Self-pay | Admitting: Family Medicine

## 2019-05-19 ENCOUNTER — Encounter: Payer: Self-pay | Admitting: Family Medicine
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
## 2019-05-22 ENCOUNTER — Other Ambulatory Visit: Payer: Self-pay

## 2019-05-22 ENCOUNTER — Encounter: Payer: Self-pay | Admitting: Family Medicine

## 2019-05-22 ENCOUNTER — Ambulatory Visit (INDEPENDENT_AMBULATORY_CARE_PROVIDER_SITE_OTHER): Admitting: Family Medicine

## 2019-05-22 VITALS — BP 138/86 | HR 77 | Temp 97.5°F | Resp 17 | Ht 64.0 in | Wt 227.0 lb

## 2019-05-22 DIAGNOSIS — R1013 Epigastric pain: Secondary | ICD-10-CM

## 2019-05-22 LAB — COMPREHENSIVE METABOLIC PANEL
ALT: 16 U/L (ref 0–35)
AST: 15 U/L (ref 0–37)
Albumin: 4.3 g/dL (ref 3.5–5.2)
Alkaline Phosphatase: 76 U/L (ref 39–117)
BUN: 9 mg/dL (ref 6–23)
CO2: 29 mEq/L (ref 19–32)
Calcium: 9.6 mg/dL (ref 8.4–10.5)
Chloride: 104 mEq/L (ref 96–112)
Creatinine, Ser: 0.78 mg/dL (ref 0.40–1.20)
GFR: 95.48 mL/min (ref >60.00)
Glucose, Bld: 100 mg/dL — ABNORMAL HIGH (ref 70–99)
Potassium: 3.7 mEq/L (ref 3.5–5.1)
Sodium: 141 mEq/L (ref 135–145)
Total Bilirubin: 0.3 mg/dL (ref 0.2–1.2)
Total Protein: 7 g/dL (ref 6.0–8.3)

## 2019-05-22 LAB — CBC
HCT: 39.9 % (ref 36.0–46.0)
Hemoglobin: 13.6 g/dL (ref 12.0–15.0)
MCHC: 34.2 g/dL (ref 30.0–36.0)
MCV: 91.2 fl (ref 78.0–100.0)
Platelets: 350 10*3/uL (ref 150.0–400.0)
RBC: 4.38 Mil/uL (ref 3.87–5.11)
RDW: 13.5 % (ref 11.5–15.5)
WBC: 4.7 10*3/uL (ref 4.0–10.5)

## 2019-05-22 LAB — LIPASE: Lipase: 14 U/L (ref 11.0–59.0)

## 2019-05-22 MED ORDER — SUCRALFATE 1 G PO TABS: 1.0000 g | ORAL_TABLET | Freq: Three times a day (TID) | ORAL | 0 refills | Status: DC

## 2019-05-22 MED ORDER — OMEPRAZOLE 40 MG PO CPDR: 40.0000 mg | DELAYED_RELEASE_CAPSULE | Freq: Every day | ORAL | 3 refills | Status: DC

## 2019-05-22 NOTE — Patient Instructions (Signed)
It was good to see you today, but I am sorry you are not feeling well!   I suspect you may have a gallbladder problem, or perhaps gastritis/ulcer  We will get some labs for you today, and obtain a gallbladder ultrasound as soon as possible.  Please stop by the imaging department on your way out today to set up an ultrasound appointment  In the meantime, we will have you start on an an acid reducer and also Carafate to coat and protect your stomach Eat a light, low-fat diet- this is less irritating to the gallbladder  If you are getting significant worse, have vomiting or fever please seek immediate care

## 2019-05-23 ENCOUNTER — Other Ambulatory Visit (HOSPITAL_BASED_OUTPATIENT_CLINIC_OR_DEPARTMENT_OTHER)

## 2019-05-23 LAB — H. PYLORI BREATH TEST: H. pylori Breath Test: NOT DETECTED

## 2019-05-27 ENCOUNTER — Ambulatory Visit (HOSPITAL_BASED_OUTPATIENT_CLINIC_OR_DEPARTMENT_OTHER)
Admission: RE | Admit: 2019-05-27 | Discharge: 2019-05-27 | Disposition: A | Discharge status: Home / Self Care | Source: Ambulatory Visit | Attending: Family Medicine | Admitting: Family Medicine

## 2019-05-27 ENCOUNTER — Other Ambulatory Visit: Payer: Self-pay

## 2019-05-27 DIAGNOSIS — R1013 Epigastric pain: Secondary | ICD-10-CM | POA: Insufficient documentation

## 2019-05-28 ENCOUNTER — Encounter: Payer: Self-pay | Admitting: Family Medicine

## 2019-05-28 DIAGNOSIS — R1013 Epigastric pain: Secondary | ICD-10-CM

## 2019-05-29 ENCOUNTER — Encounter: Payer: Self-pay | Admitting: Gastroenterology

## 2019-06-23 ENCOUNTER — Encounter: Payer: Self-pay | Admitting: Family Medicine

## 2019-07-02 ENCOUNTER — Encounter: Payer: Self-pay | Admitting: Family Medicine

## 2019-07-02 MED ORDER — CLONAZEPAM 0.5 MG PO TABS: 0.5000 mg | ORAL_TABLET | ORAL | 2 refills | Status: DC | PRN

## 2019-07-03 ENCOUNTER — Encounter: Payer: Self-pay | Admitting: Family Medicine

## 2019-07-15 ENCOUNTER — Ambulatory Visit (INDEPENDENT_AMBULATORY_CARE_PROVIDER_SITE_OTHER): Admitting: Gastroenterology

## 2019-07-15 ENCOUNTER — Encounter: Payer: Self-pay | Admitting: Gastroenterology

## 2019-07-15 VITALS — BP 140/78 | HR 77 | Temp 97.1°F | Ht 64.0 in | Wt 229.5 lb

## 2019-07-15 DIAGNOSIS — R1013 Epigastric pain: Secondary | ICD-10-CM

## 2019-07-15 DIAGNOSIS — K760 Fatty (change of) liver, not elsewhere classified: Secondary | ICD-10-CM | POA: Diagnosis not present

## 2019-07-15 NOTE — Patient Instructions (Signed)
If you are age 48 or older, your body mass index should be between 23-30. Your Body mass index is 39.39 kg/m. If this is out of the aforementioned range listed, please consider follow up with your Primary Care Provider.  If you are age 40 or younger, your body mass index should be between 19-25. Your Body mass index is 39.39 kg/m. If this is out of the aformentioned range listed, please consider follow up with your Primary Care Provider.   You have been scheduled for an endoscopy. Please follow written instructions given to you at your visit today. If you use inhalers (even only as needed), please bring them with you on the day of your procedure. Your physician has requested that you go to www.startemmi.com and enter the access code given to you at your visit today. This web site gives a general overview about your procedure. However, you should still follow specific instructions given to you by our office regarding your preparation for the procedure.  Thank you for choosing me and [facility].  Jackquline Denmark, MD

## 2019-07-15 NOTE — Progress Notes (Signed)
Chief Complaint: Epi pain  Referring Provider:  Darreld Mclean, MD      ASSESSMENT AND PLAN;   #1. Epi pain. Neg [place] 05/2019 except fatty liver  #2. Fatty liver with Nl LFTs  #3. IBS-C.  Better with Juice Plus. Neg colon 04/2019.   Plan: -EGD for further evaluation.  Discussed risks and benefits. -If neg, HIDA with EF -Wt loss. Aim is to reduce 6lb over 12 weeks.  Stop fried foods, sodas, high fructose-containing foods.  Start exercising like walking 30 min/day.  Encouraged her to join her husband who walks every day. -Continue omeprazole 40 mg p.o. QD for now.   HPI:    Margaret Davis is a 49 y.o. female  Epi pain since may 2021 Failed trial of omeprazole and carafate, minimal improvement Had ultrasound abdomen 05/23/2019 since her mother had gallstones.  It was negative except for fatty liver  She denies having any odynophagia or dysphagia.  No fever chills or night sweats.  No jaundice dark urine or pale stools.  No nonsteroidals.  More constipation with IBS-C.  Better with juice plus.  No weight loss or loss of appetite.  Colon 04/2019 as a part of colorectal cancer screening- neg (report scanned).  Repeat in 10 years.  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 Anxiety   . Elevated cholesterol   . GERD (gastroesophageal reflux disease)    OCCASIONAL  . Mass    RIGHT OVARY  --ABDOMINAL PAIN  . Panic attacks   . Sleep apnea   . Syncope    PT STATES HER HEART AND BRAIN "MISFIRE" IF OVERLY HEATED OR UPSET--AND PT WOULD PASS OUT --NO EPISODES SINCE 2000  - NO MEDICATIONS--PT NO LONGER SEES CARDIOLOGIST    Past Surgical History:  Procedure Laterality Date  . ABDOMINAL HYSTERECTOMY  07/2011   partical  . COLONOSCOPY  04/2019  . WISDOM TOOTH  EXTRACTION      Family History  Problem Relation Age of Onset  . Diabetes Mother   . Colon polyps Mother        benign  . Hypertension Father   . Colon cancer Neg Hx   . Esophageal cancer Neg Hx     Social History   Tobacco Use  . Smoking status: Never Smoker  . Smokeless tobacco: Never Used  Vaping Use  . Vaping Use: Never used  Substance Use Topics  . Alcohol use: No    Comment: rarely  . Drug use: No    Current Outpatient Medications  Medication Sig Dispense Refill  . cholecalciferol (VITAMIN D) 1000 UNITS tablet Take 1,000 Units by mouth daily.    . clonazePAM (KLONOPIN) 0.5 MG tablet Take 1 tablet (0.5 mg total) by mouth as needed for anxiety. 60 tablet 2  . co-enzyme Q-10 30 MG capsule Take 200 mg by mouth daily.     Marland Kitchen esomeprazole (NEXIUM) 40 MG capsule Take 40 mg by mouth. ONCE A DAY  IF NEEDED    . loratadine (CLARITIN) 10 MG tablet Take 10 mg by mouth daily.    Marland Kitchen omeprazole (PRILOSEC) 40 MG capsule Take 1 capsule (40 mg total) by mouth daily. 30 capsule 3  . simvastatin (ZOCOR) 20 MG tablet Take 20 mg by  mouth at bedtime.    Marland Kitchen estradiol (ESTRACE) 0.1 MG/GM vaginal cream Place 0.5 g vaginally 2 (two) times a week. (Patient not taking: Reported on 07/15/2019)     No current facility-administered medications for this visit.    Allergies  Allergen Reactions  . [place]    Patient reports she "can drink OJ without a problem."   . Latex Hives    CONTACT WITH LATEX CAUSES HIVES  . Other     CAN'T EAT ORANGES-BUT CAN DRINK OJ  . Phenobarbital     AS ACHILD--THOUGHT TO BE HAVING SEIZURES--HAD ALLERGIC REACTION TO PHENOBARBITAL.   LATER TOLD SHE DID NOT HAVE SEIZURES    Review of Systems:  Constitutional: Denies fever, chills, diaphoresis, appetite change and fatigue.  HEENT: Denies photophobia, eye pain, redness, hearing loss, ear pain, congestion, sore throat, rhinorrhea, sneezing, mouth sores, neck pain, neck stiffness and tinnitus.   Respiratory:  Denies SOB, DOE, cough, chest tightness,  and wheezing.   Cardiovascular: Denies chest pain, palpitations and leg swelling.  Genitourinary: Denies dysuria, urgency, frequency, hematuria, flank pain and difficulty urinating.  Musculoskeletal: Denies myalgias, back pain, joint swelling, arthralgias and gait problem.  Skin: No rash.  Neurological: Denies dizziness, seizures, syncope, weakness, light-headedness, numbness and headaches.  Hematological: Denies adenopathy. Easy bruising, personal or family bleeding history  Psychiatric/Behavioral: No anxiety or depression     Physical Exam:    BP 140/78   Pulse 77   Temp (!) 97.1 F (36.2 C)   Ht 5\' 4"  (1.626 m)   Wt 229 lb 8 oz (104.1 kg)   LMP 06/04/2011   BMI 39.39 kg/m  Wt Readings from Last 3 Encounters:  07/15/19 229 lb 8 oz (104.1 kg)  05/22/19 227 lb (103 kg)  03/28/19 228 lb (103.4 kg)   Constitutional:  Well-developed, in no acute distress. Psychiatric: Normal mood and affect. Behavior is normal. HEENT: Pupils normal.  Conjunctivae are normal. No scleral icterus. Neck supple.  Cardiovascular: Normal rate, regular rhythm. No edema Pulmonary/chest: Effort normal and breath sounds normal. No wheezing, rales or rhonchi. Abdominal: Soft, nondistended.  Mild tenderness epigastric area. Bowel sounds active throughout. There are no masses palpable. No hepatomegaly. Rectal:  defered Neurological: Alert and oriented to person place and time. Skin: Skin is warm and dry. No rashes noted.  Data Reviewed: I have personally reviewed following labs and imaging studies  CBC: CBC Latest Ref Rng & Units 05/22/2019 02/14/2019 08/19/2013  WBC 4.0 - 10.5 K/uL 4.7 5.9 5.9  Hemoglobin 12.0 - 15.0 g/dL 13.6 13.3 13.7  Hematocrit 36 - 46 % 39.9 40.0 39.8  Platelets 150 - 400 K/uL 350.0 389.0 400    CMP: CMP Latest Ref Rng & Units 05/22/2019 02/14/2019 09/06/2012  Glucose 70 - 99 mg/dL 100(H) 89 99  BUN 6 - 23 mg/dL 9 9 6   Creatinine 0.40 - 1.20  mg/dL 0.78 0.75 0.70  Sodium 135 - 145 mEq/L 141 141 142  Potassium 3.5 - 5.1 mEq/L 3.7 3.6 3.2(L)  Chloride 96 - 112 mEq/L 104 104 105  CO2 19 - 32 mEq/L 29 27 28   Calcium 8.4 - 10.5 mg/dL 9.6 9.7 9.6  Total Protein 6.0 - 8.3 g/dL 7.0 7.6 7.4  Total Bilirubin 0.2 - 1.2 mg/dL 0.3 0.4 0.2(L)  Alkaline Phos 39 - 117 U/L 76 66 77  AST 0 - 37 U/L 15 15 18   ALT 0 - 35 U/L 16 17 19     GFR: CrCl cannot be calculated (Patient's  most recent lab result is older than the maximum 21 days allowed.). Liver Function Tests: No results for input(s): AST, ALT, ALKPHOS, BILITOT, PROT, ALBUMIN in the last 168 hours. No results for input(s): LIPASE, AMYLASE in the last 168 hours. No results for input(s): AMMONIA in the last 168 hours. Coagulation Profile: No results for input(s): INR, PROTIME in the last 168 hours. HbA1C: No results for input(s): HGBA1C in the last 72 hours. Lipid Profile: No results for input(s): CHOL, HDL, LDLCALC, TRIG, CHOLHDL, LDLDIRECT in the last 72 hours. Thyroid Function Tests: No results for input(s): TSH, T4TOTAL, FREET4, T3FREE, THYROIDAB in the last 72 hours. Anemia Panel: No results for input(s): VITAMINB12, FOLATE, FERRITIN, TIBC, IRON, RETICCTPCT in the last 72 hours.  No results found for this or any previous visit (from the past 240 hour(s)).    Radiology Studies: No results found.    Carmell Austria, MD 07/15/2019, 9:07 AM  Cc: Darreld Mclean, MD

## 2019-08-16 ENCOUNTER — Encounter: Payer: Self-pay | Admitting: Gastroenterology

## 2019-08-16 ENCOUNTER — Other Ambulatory Visit: Payer: Self-pay

## 2019-08-16 ENCOUNTER — Ambulatory Visit (AMBULATORY_SURGERY_CENTER): Admitting: Gastroenterology

## 2019-08-16 VITALS — BP 148/91 | HR 67 | Temp 97.8°F | Resp 20 | Ht 64.0 in | Wt 229.0 lb

## 2019-08-16 DIAGNOSIS — K2900 Acute gastritis without bleeding: Secondary | ICD-10-CM

## 2019-08-16 DIAGNOSIS — R1013 Epigastric pain: Secondary | ICD-10-CM

## 2019-08-16 DIAGNOSIS — K2951 Unspecified chronic gastritis with bleeding: Secondary | ICD-10-CM

## 2019-08-16 MED ORDER — SODIUM CHLORIDE 0.9 % IV SOLN: 500.0000 mL | Freq: Once | INTRAVENOUS | Status: DC

## 2019-08-16 NOTE — Progress Notes (Signed)
Report to PACU, RN, vss, BBS= Clear.  

## 2019-08-16 NOTE — Progress Notes (Signed)
Called to room to assist during endoscopic procedure.  Patient ID and intended procedure confirmed with present staff. Received instructions for my participation in the procedure from the performing physician.  

## 2019-08-16 NOTE — Progress Notes (Signed)
Pt's states no medical or surgical changes since previsit or office visit. 

## 2019-08-16 NOTE — Patient Instructions (Addendum)
Resume previous diet  continue present medications  await pathology results  return to GI clinic in 12 weeks AVOID NSAIDS; IBUPROFEN, ASPIRIN, ALEVE   YOU HAD AN ENDOSCOPIC PROCEDURE TODAY AT [place]:   Refer to the procedure report that was given to you for any specific questions about what was found during the examination.  If the procedure report does not answer your questions, please call your gastroenterologist to clarify.  If you requested that your care partner not be given the details of your procedure findings, then the procedure report has been included in a sealed envelope for you to review at your convenience later.  YOU SHOULD EXPECT: Some feelings of bloating in the abdomen. Passage of more gas than usual.  Walking can help get rid of the air that was put into your GI tract during the procedure and reduce the bloating. If you had a lower endoscopy (such as a colonoscopy or flexible sigmoidoscopy) you may notice spotting of blood in your stool or on the toilet paper. If you underwent a bowel prep for your procedure, you may not have a normal bowel movement for a few days.  Please Note:  You might notice some irritation and congestion in your nose or some drainage.  This is from the oxygen used during your procedure.  There is no need for concern and it should clear up in a day or so.  SYMPTOMS TO REPORT IMMEDIATELY:   Following upper endoscopy (EGD)  Vomiting of blood or coffee ground material  New chest pain or pain under the shoulder blades  Painful or persistently difficult swallowing  New shortness of breath  Fever of 100F or higher  Black, tarry-looking stools  For urgent or emergent issues, a gastroenterologist can be reached at any hour by calling [phone]. Do not use MyChart messaging for urgent concerns.    DIET:  We do recommend a small meal at first, but then you may proceed to your regular diet.  Drink plenty of fluids but you should  avoid alcoholic beverages for 24 hours.  ACTIVITY:  You should plan to take it easy for the rest of today and you should NOT DRIVE or use heavy machinery until tomorrow (because of the sedation medicines used during the test).    FOLLOW UP: Our staff will call the number listed on your records 48-72 hours following your procedure to check on you and address any questions or concerns that you may have regarding the information given to you following your procedure. If we do not reach you, we will leave a message.  We will attempt to reach you two times.  During this call, we will ask if you have developed any symptoms of COVID 19. If you develop any symptoms (ie: fever, flu-like symptoms, shortness of breath, cough etc.) before then, please call [phone].  If you test positive for Covid 19 in the 2 weeks post procedure, please call and report this information to [place].    If any biopsies were taken you will be contacted by phone or by letter within the next 1-3 weeks.  Please call us at [phone] if you have not heard about the biopsies in 3 weeks.    SIGNATURES/CONFIDENTIALITY: You and/or your care partner have signed paperwork which will be entered into your electronic medical record.  These signatures attest to the fact that that the information above on your After Visit Summary has been reviewed and is understood.  Full responsibility of  the confidentiality of this discharge information lies with you and/or your care-partner. 

## 2019-08-16 NOTE — Op Note (Signed)
[place] Patient Name: Margaret Davis Procedure Date: 08/16/2019 9:19 AM MRN: [id number] Endoscopist: Jackquline Denmark , MD Age: 49 Referring MD:  Date of Birth: 1970-12-28 Gender: Female Account #: [id number] Procedure:                Upper GI endoscopy Indications:              Epigastric abdominal pain Medicines:                Monitored Anesthesia Care Procedure:                Pre-Anesthesia Assessment:                           - Prior to the procedure, a History and Physical                            was performed, and patient medications and                            allergies were reviewed. The patient's tolerance of                            previous anesthesia was also reviewed. The risks                            and benefits of the procedure and the sedation                            options and risks were discussed with the patient.                            All questions were answered, and informed consent                            was obtained. Prior Anticoagulants: The patient has                            taken no previous anticoagulant or antiplatelet                            agents. ASA Grade Assessment: II - A patient with                            mild systemic disease. After reviewing the risks                            and benefits, the patient was deemed in                            satisfactory condition to undergo the procedure.                           After obtaining informed consent, the endoscope was  passed under direct vision. Throughout the                            procedure, the patient's blood pressure, pulse, and                            oxygen saturations were monitored continuously. The                            Endoscope was introduced through the mouth, and                            advanced to the second part of duodenum. The upper                            GI endoscopy was accomplished  without difficulty.                            The patient tolerated the procedure well. Scope In: Scope Out: Findings:                 The examined esophagus was normal with well-defined                            Z-line at 36 cm. Examined by NBI.                           Localized minimal inflammation characterized by                            erythema was found in the gastric antrum. Biopsies                            were taken with a cold forceps for histology.                           The examined duodenum was normal. Biopsies were                            taken with a cold forceps for histology. Complications:            No immediate complications. Estimated Blood Loss:     Estimated blood loss: none. Impression:               -Minimal gastritis. Recommendation:           - Patient has a contact number available for                            emergencies. The signs and symptoms of potential                            delayed complications were discussed with the                            patient. Return to normal  activities tomorrow.                            Written discharge instructions were provided to the                            patient.                           - Resume previous diet.                           - Continue present medications.                           - Await pathology results.                           - Return to GI clinic in 12 weeks.                           - Avoid nonsteroidals.                           - The findings and recommendations were discussed                            with the patient's husband Duane. Jackquline Denmark, MD 08/16/2019 9:37:20 AM This report has been signed electronically.

## 2019-08-20 ENCOUNTER — Telehealth: Payer: Self-pay | Admitting: Gastroenterology

## 2019-08-20 ENCOUNTER — Encounter: Payer: Self-pay | Admitting: Gastroenterology

## 2019-08-20 ENCOUNTER — Telehealth: Payer: Self-pay | Admitting: *Deleted

## 2019-08-20 NOTE — Telephone Encounter (Signed)
Second attempt, left VM.  

## 2019-08-20 NOTE — Telephone Encounter (Signed)
First attempt, left VM.  

## 2019-08-30 ENCOUNTER — Other Ambulatory Visit

## 2019-08-30 ENCOUNTER — Other Ambulatory Visit: Payer: Self-pay

## 2019-08-30 DIAGNOSIS — Z20822 Contact with and (suspected) exposure to covid-19: Secondary | ICD-10-CM

## 2019-08-31 LAB — SARS-COV-2, NAA 2 DAY TAT

## 2019-08-31 LAB — NOVEL CORONAVIRUS, NAA: SARS-CoV-2, NAA: NOT DETECTED

## 2019-09-10 ENCOUNTER — Ambulatory Visit (INDEPENDENT_AMBULATORY_CARE_PROVIDER_SITE_OTHER): Admitting: Nurse Practitioner

## 2019-09-10 ENCOUNTER — Other Ambulatory Visit: Payer: Self-pay

## 2019-09-10 VITALS — Ht 64.0 in

## 2019-09-10 DIAGNOSIS — Z8616 Personal history of COVID-19: Secondary | ICD-10-CM

## 2019-09-10 DIAGNOSIS — R439 Unspecified disturbances of smell and taste: Secondary | ICD-10-CM | POA: Diagnosis not present

## 2019-09-10 NOTE — Patient Instructions (Signed)
Covid 19 Loss of taste and smell:  Will place referral to Dr. Brett Fairy in neurology - hand out given on [place] olfactory retraining program  Stay well hydrated  Stay active   Follow up:  Follow up if needed

## 2019-09-10 NOTE — Assessment & Plan Note (Signed)
Loss of taste and smell:  Will place referral to Dr. Brett Fairy in neurology - hand out given on [place] olfactory retraining program  Stay well hydrated  Stay active   Follow up:  Follow up if needed

## 2019-09-10 NOTE — Progress Notes (Signed)
@Patient  ID: Margaret Davis, female    DOB: 04-Apr-1970, 49 y.o.   MRN: [id number]  Chief Complaint  Patient presents with  . COVID Follow up    Positive 12/20; Sx: No smell or taste    Referring provider: Copland, Gay Filler, MD   49 year old female with history of syncope, OSA, GERD, anxiety, hyperlipidemia, and obesity.  Diagnosed with Covid in December 2020.  HPI  Patient presents today for post COVID care clinic visit.  Patient was diagnosed with Covid on 12/23/2018.  She complains today of ongoing loss of taste and smell.  She states that at times she can smell different things for only a few seconds and the smell goes away again.  She does experience a " smoke smell" at times even though there is no smoke around.  She has tried some essential oils without any relief.Denies f/c/s, n/v/d, hemoptysis, PND, chest pain or edema.      Allergies  Allergen Reactions  . [place]    Patient reports she "can drink OJ without a problem."   . Latex Hives    CONTACT WITH LATEX CAUSES HIVES  . Other     CAN'T EAT ORANGES-BUT CAN DRINK OJ  . Phenobarbital     AS ACHILD--THOUGHT TO BE HAVING SEIZURES--HAD ALLERGIC REACTION TO PHENOBARBITAL.   LATER TOLD SHE DID NOT HAVE SEIZURES    Immunization History  Administered Date(s) Administered  . Influenza-Unspecified 11/28/2018  . PFIZER SARS-COV-2 Vaccination 03/01/2019, 03/29/2019  . Tdap 02/14/2019    Past Medical History:  Diagnosis Date  . Anxiety   . Elevated cholesterol   . GERD (gastroesophageal reflux disease)    OCCASIONAL  . Mass    RIGHT OVARY  --ABDOMINAL PAIN  . Panic attacks   . Sleep apnea   . Syncope    PT STATES HER HEART AND BRAIN "MISFIRE" IF OVERLY HEATED OR UPSET--AND PT WOULD PASS OUT --NO EPISODES SINCE 2000  - NO MEDICATIONS--PT NO LONGER SEES CARDIOLOGIST    Tobacco History: Social History   Tobacco Use  Smoking Status Never Smoker  Smokeless Tobacco Never Used   Counseling given: Not  Answered   Outpatient Encounter Medications as of 09/10/2019  Medication Sig  . cholecalciferol (VITAMIN D) 1000 UNITS tablet Take 1,000 Units by mouth daily.  . clonazePAM (KLONOPIN) 0.5 MG tablet Take 1 tablet (0.5 mg total) by mouth as needed for anxiety.  Marland Kitchen co-enzyme Q-10 30 MG capsule Take 200 mg by mouth daily.   Marland Kitchen esomeprazole (NEXIUM) 40 MG capsule Take 40 mg by mouth. ONCE A DAY  IF NEEDED  . loratadine (CLARITIN) 10 MG tablet Take 10 mg by mouth daily.  . simvastatin (ZOCOR) 20 MG tablet Take 20 mg by mouth at bedtime.  Marland Kitchen estradiol (ESTRACE) 0.1 MG/GM vaginal cream Place 0.5 g vaginally 2 (two) times a week. (Patient not taking: Reported on 07/15/2019)  . omeprazole (PRILOSEC) 40 MG capsule Take 1 capsule (40 mg total) by mouth daily. (Patient not taking: Reported on 09/10/2019)   No facility-administered encounter medications on file as of 09/10/2019.     Review of Systems  Review of Systems  Constitutional: Negative.   HENT: Negative.   Respiratory: Negative for cough and shortness of breath.   Cardiovascular: Negative.   Gastrointestinal: Negative.   Allergic/Immunologic: Negative.   Neurological: Negative.        Loss of taste and smell  Psychiatric/Behavioral: Negative.        Physical Exam  Ht  5\' 4"  (1.626 m)   LMP 06/04/2011   BMI 39.31 kg/m   Wt Readings from Last 5 Encounters:  08/16/19 229 lb (103.9 kg)  07/15/19 229 lb 8 oz (104.1 kg)  05/22/19 227 lb (103 kg)  03/28/19 228 lb (103.4 kg)  02/14/19 229 lb (103.9 kg)     Physical Exam Vitals and nursing note reviewed.  Constitutional:      General: She is not in acute distress.    Appearance: She is well-developed.  Cardiovascular:     Rate and Rhythm: Normal rate and regular rhythm.  Pulmonary:     Effort: Pulmonary effort is normal.     Breath sounds: Normal breath sounds.  Musculoskeletal:     Right lower leg: No edema.     Left lower leg: No edema.  Neurological:     Mental Status: She  is alert and oriented to person, place, and time.  Psychiatric:        Mood and Affect: Mood normal.        Behavior: Behavior normal.        Assessment & Plan:   History of COVID-19 Loss of taste and smell:  Will place referral to Dr. Brett Fairy in neurology - hand out given on [place] olfactory retraining program  Stay well hydrated  Stay active   Follow up:  Follow up if needed     Fenton Foy, NP 09/10/2019

## 2019-09-30 ENCOUNTER — Encounter: Payer: Self-pay | Admitting: Family Medicine

## 2019-09-30 MED ORDER — EPINEPHRINE 0.3 MG/0.3ML IJ SOAJ: 0.3000 mg | INTRAMUSCULAR | 99 refills | Status: AC | PRN

## 2019-10-07 ENCOUNTER — Other Ambulatory Visit: Payer: Self-pay | Admitting: Family Medicine

## 2019-10-07 DIAGNOSIS — R1013 Epigastric pain: Secondary | ICD-10-CM

## 2019-10-24 ENCOUNTER — Encounter: Payer: Self-pay | Admitting: Family Medicine

## 2019-10-28 ENCOUNTER — Encounter: Payer: Self-pay | Admitting: Family Medicine

## 2019-11-05 ENCOUNTER — Encounter: Payer: Self-pay | Admitting: Family Medicine

## 2019-11-05 ENCOUNTER — Other Ambulatory Visit: Payer: Self-pay | Admitting: Family Medicine

## 2019-11-05 NOTE — Telephone Encounter (Signed)
Requesting: clonazepam 0.5mg  Contract: None UDS: None Last Visit: 09/10/19 Next Visit: None scheduled Last Refill: 07/02/2019 #60 and 2RF Pt sig: 1 tab prn  Please Advise

## 2019-11-12 NOTE — Patient Instructions (Addendum)
It was great to see you again today- I will be in touch with your labs asap I am so sorry for the recent tragic events at your school  We will set you up for a treadmill stress test to make sure your shortness of breath is not due to your heart.  Will also get you referred to see pulmonology Try the albuterol inhaler prior to exercise Continue cholesterol medication   Please pick up a BP cuff for home and start monitoring your BP a few times a week.  If routinely higher than 140/90 please contact me        Health Maintenance, Female Adopting a healthy lifestyle and getting preventive care are important in promoting health and wellness. Ask your health care provider about:  The right schedule for you to have regular tests and exams.  Things you can do on your own to prevent diseases and keep yourself healthy. What should I know about diet, weight, and exercise? Eat a healthy diet   Eat a diet that includes plenty of vegetables, fruits, low-fat dairy products, and lean protein.  Do not eat a lot of foods that are high in solid fats, added sugars, or sodium. Maintain a healthy weight Body mass index (BMI) is used to identify weight problems. It estimates body fat based on height and weight. Your health care provider can help determine your BMI and help you achieve or maintain a healthy weight. Get regular exercise Get regular exercise. This is one of the most important things you can do for your health. Most adults should:  Exercise for at least 150 minutes each week. The exercise should increase your heart rate and make you sweat (moderate-intensity exercise).  Do strengthening exercises at least twice a week. This is in addition to the moderate-intensity exercise.  Spend less time sitting. Even light physical activity can be beneficial. Watch cholesterol and blood lipids Have your blood tested for lipids and cholesterol at 48 years of age, then have this test every 5 years. Have  your cholesterol levels checked more often if:  Your lipid or cholesterol levels are high.  You are older than 48 years of age.  You are at high risk for heart disease. What should I know about cancer screening? Depending on your health history and family history, you may need to have cancer screening at various ages. This may include screening for:  Breast cancer.  Cervical cancer.  Colorectal cancer.  Skin cancer.  Lung cancer. What should I know about heart disease, diabetes, and high blood pressure? Blood pressure and heart disease  High blood pressure causes heart disease and increases the risk of stroke. This is more likely to develop in people who have high blood pressure readings, are of African descent, or are overweight.  Have your blood pressure checked: ? Every 3-5 years if you are 23-84 years of age. ? Every year if you are 75 years old or older. Diabetes Have regular diabetes screenings. This checks your fasting blood sugar level. Have the screening done:  Once every three years after age 79 if you are at a normal weight and have a low risk for diabetes.  More often and at a younger age if you are overweight or have a high risk for diabetes. What should I know about preventing infection? Hepatitis B If you have a higher risk for hepatitis B, you should be screened for this virus. Talk with your health care provider to find out if you are  at risk for hepatitis B infection. Hepatitis C Testing is recommended for:  Everyone born from [birth year] through 1964.  Anyone with known risk factors for hepatitis C. Sexually transmitted infections (STIs)  Get screened for STIs, including gonorrhea and chlamydia, if: ? You are sexually active and are younger than 48 years of age. ? You are older than 48 years of age and your health care provider tells you that you are at risk for this type of infection. ? Your sexual activity has changed since you were last screened, and you  are at increased risk for chlamydia or gonorrhea. Ask your health care provider if you are at risk.  Ask your health care provider about whether you are at high risk for HIV. Your health care provider may recommend a prescription medicine to help prevent HIV infection. If you choose to take medicine to prevent HIV, you should first get tested for HIV. You should then be tested every 3 months for as long as you are taking the medicine. Pregnancy  If you are about to stop having your period (premenopausal) and you may become pregnant, seek counseling before you get pregnant.  Take 400 to 800 micrograms (mcg) of folic acid every day if you become pregnant.  Ask for birth control (contraception) if you want to prevent pregnancy. Osteoporosis and menopause Osteoporosis is a disease in which the bones lose minerals and strength with aging. This can result in bone fractures. If you are 45 years old or older, or if you are at risk for osteoporosis and fractures, ask your health care provider if you should:  Be screened for bone loss.  Take a calcium or vitamin D supplement to lower your risk of fractures.  Be given hormone replacement therapy (HRT) to treat symptoms of menopause. Follow these instructions at home: Lifestyle  Do not use any products that contain nicotine or tobacco, such as cigarettes, e-cigarettes, and chewing tobacco. If you need help quitting, ask your health care provider.  Do not use street drugs.  Do not share needles.  Ask your health care provider for help if you need support or information about quitting drugs. Alcohol use  Do not drink alcohol if: ? Your health care provider tells you not to drink. ? You are pregnant, may be pregnant, or are planning to become pregnant.  If you drink alcohol: ? Limit how much you use to 0-1 drink a day. ? Limit intake if you are breastfeeding.  Be aware of how much alcohol is in your drink. In the [place] one drink equals one 12  oz bottle of beer (355 mL), one 5 oz glass of wine (148 mL), or one 1 oz glass of hard liquor (44 mL). General instructions  Schedule regular health, dental, and eye exams.  Stay current with your vaccines.  Tell your health care provider if: ? You often feel depressed. ? You have ever been abused or do not feel safe at home. Summary  Adopting a healthy lifestyle and getting preventive care are important in promoting health and wellness.  Follow your health care provider's instructions about healthy diet, exercising, and getting tested or screened for diseases.  Follow your health care provider's instructions on monitoring your cholesterol and blood pressure. This information is not intended to replace advice given to you by your health care provider. Make sure you discuss any questions you have with your health care provider. Document Revised: 12/13/2017 Document Reviewed: 12/13/2017 Elsevier Patient Education  2020 [employer].

## 2019-11-12 NOTE — Progress Notes (Addendum)
[place] [facility] [address] [place], [place] [address] [phone]  Date:  11/14/2019   Name:  Margaret Davis   DOB:  02-Oct-1970   MRN:  [id number]  PCP:  Darreld Mclean, MD    Chief Complaint: Annual Exam   History of Present Illness:  Margaret Davis is a 49 y.o. very pleasant female patient who presents with the following:  Here today for a CPE- last seen by myself in May of this year with epigastric pain This is doing better.  prilosec actually seems to make her sx worse She is using nexium prn which does help History of sleep apnea and obesity.  She also had COVID-19 in December 2020 Ever since she had covid she feels as though her lungs have not returned to completely normal.  She will get SOB more easily- esp if she is climbing stairs with her mask on at school She is not having chest pain.  Married, 2 school-aged children.  She is an Environmental consultant principal at [employer] She does not feel like she is depressed although she is anxious-they have recently have several tragedies amongst the student body.  A recent graduate died in an accident, and another student died by suicide  Her son got his first shot of covid - he is 65 years old and doing well Daughter is 56, she is doing well  Hep C and HIV screening-will do today Pap- history of hysterectomy, has gynecology care Flu vaccine done covid series complete mammo 6/21 Colon 4/21  CMP and CBC done in May Lipids, TSH due  Needs thyroid US follow-up 2/22-this has been ordered-reminded patient to be alert for this appointment IMPRESSION: 1. Moderately heterogeneous, multinodular goiter as detailed above. 2. There are 3 distinct TR3 thyroid nodules as detailed above. A a 1 year follow-up ultrasound is recommended for these 3 nodules.  The above is in keeping with the ACR TI-RADS recommendations - J Am Coll Radiol  2017;14:587-595.  Prilosec Simvastatin Clonazepam  Both her mom and dad have HTN, noted high blood pressure today.  This is better on recheck.  Patient also reports a recent very favorable blood pressure at another office.  Last week Margaret Davis had a painful charley horse in her right calf.  This occurred at night, was very painful-the area is still sore but not swollen  She has seen cardiology in the past, Dr. Irven Shelling office.  She reports that she did a stress test several years ago which was normal    BP Readings from Last 3 Encounters:  11/14/19 130/90  08/16/19 (!) 148/91  07/15/19 140/78    Patient Active Problem List   Diagnosis Date Noted  . Olfactory impairment 09/10/2019  . History of COVID-19 09/10/2019  . Hyperlipidemia 02/14/2019  . OSA (obstructive sleep apnea) 02/04/2013  . Obesity (BMI 30-39.9) 02/04/2013  . Syncope   . GERD (gastroesophageal reflux disease)   . Panic attacks   . Routine gynecological examination 06/22/2012    Past Medical History:  Diagnosis Date  . Anxiety   . Elevated cholesterol   . GERD (gastroesophageal reflux disease)    OCCASIONAL  . Mass    RIGHT OVARY  --ABDOMINAL PAIN  . Panic attacks   . Sleep apnea   . Syncope    PT STATES HER HEART AND BRAIN "MISFIRE" IF OVERLY HEATED OR UPSET--AND PT WOULD PASS OUT --NO EPISODES SINCE 2000  -  NO MEDICATIONS--PT NO LONGER SEES CARDIOLOGIST    Past Surgical History:  Procedure Laterality Date  . ABDOMINAL HYSTERECTOMY  07/2011   partical  . COLONOSCOPY  04/2019  . WISDOM TOOTH EXTRACTION      Social History   Tobacco Use  . Smoking status: Never Smoker  . Smokeless tobacco: Never Used  Vaping Use  . Vaping Use: Never used  Substance Use Topics  . Alcohol use: No    Comment: rarely  . Drug use: No    Family History  Problem Relation Age of Onset  . Diabetes Mother   . Colon polyps Mother        benign  . Hypertension Father   . Colon cancer Neg Hx   . Esophageal cancer  Neg Hx     Allergies  Allergen Reactions  . Magnolia    Patient reports she "can drink OJ without a problem."   . Latex Hives    CONTACT WITH LATEX CAUSES HIVES  . Omeprazole Other (See Comments)    Abdominal pain  . Other     CAN'T EAT ORANGES-BUT CAN DRINK OJ  . Phenobarbital     AS ACHILD--THOUGHT TO BE HAVING SEIZURES--HAD ALLERGIC REACTION TO PHENOBARBITAL.   LATER TOLD SHE DID NOT HAVE SEIZURES    Medication list has been reviewed and updated.  Current Outpatient Medications on File Prior to Visit  Medication Sig Dispense Refill  . cholecalciferol (VITAMIN D) 1000 UNITS tablet Take 1,000 Units by mouth daily.    . clonazePAM (KLONOPIN) 0.5 MG tablet TAKE 1 TABLET BY MOUTH AS NEEDED FOR ANXIETY 60 tablet 1  . co-enzyme Q-10 30 MG capsule Take 200 mg by mouth daily.     Marland Kitchen EPINEPHrine (EPIPEN 2-PAK) 0.3 mg/0.3 mL IJ SOAJ injection Inject 0.3 mg into the muscle as needed for anaphylaxis. 1 each prn  . esomeprazole (NEXIUM) 40 MG capsule Take 40 mg by mouth. ONCE A DAY  IF NEEDED    . loratadine (CLARITIN) 10 MG tablet Take 10 mg by mouth daily.    Marland Kitchen estradiol (ESTRACE) 0.1 MG/GM vaginal cream Place 0.5 g vaginally 2 (two) times a week. (Patient not taking: Reported on 07/15/2019)     No current facility-administered medications on file prior to visit.    Review of Systems:  As per HPI- otherwise negative.   Physical Examination: Vitals:   11/14/19 1040 11/14/19 1114  BP: (!) 158/100 130/90  Pulse: 71   Resp: 17   SpO2: 94%    Vitals:   11/14/19 1040  Weight: 222 lb (100.7 kg)  Height: 5\' 4"  (1.626 m)   Body mass index is 38.11 kg/m. Ideal Body Weight: Weight in (lb) to have BMI = 25: 145.3  GEN: no acute distress.  Obese, looks well HEENT: Atraumatic, Normocephalic.   PEERL, EOMI, TM wnl bilaterally  Ears and Nose: No external deformity. CV: RRR, No M/G/R. No JVD. No thrill. No extra heart sounds. PULM: CTA B, no wheezes, crackles, rhonchi. No  retractions. No resp. distress. No accessory muscle use. ABD: S, NT, ND, +BS. No rebound. No HSM. EXTR: No c/c/e PSYCH: Normally interactive. Conversant.  Right calf is tender from recent muscle cramping.  No swelling or redness/heat   Assessment and Plan: Physical exam  Mixed hyperlipidemia - Plan: Lipid panel, simvastatin (ZOCOR) 20 MG tablet  Obesity (BMI 30-39.9)  Screening for thyroid disorder - Plan: TSH  SOB (shortness of breath) - Plan: Exercise Tolerance Test, Ambulatory referral to  Pulmonology, albuterol (VENTOLIN HFA) 108 (90 Base) MCG/ACT inhaler  Screening for HIV (human immunodeficiency virus) - Plan: HIV Antibody (routine testing w rflx)  Encounter for hepatitis C screening test for low risk patient - Plan: Hepatitis C antibody  Calf cramp - Plan: Basic metabolic panel  Patient today for physical exam, labs pending as above Health maintenance is up-to-date Discussed recent muscle cramp.  I am less suspicious of a DVT given her history of acute muscle cramp.  Offered ultrasound, she declines at this time and will continue to observe Shortness of breath with exercise since COVID-19 almost 1 year ago.  Patient notes the symptoms most when she is either climbing stairs or walking briskly at school while wearing her mask.  When she is outdoors and not wearing a mask she is not having the same degree of difficulty Referral to pulmonology made Give her albuterol inhaler to try We will also set up an exercise treadmill test to restratify for CAD She will let me know if any worsening or change in her symptoms in the meantime Cheryn does also have sleep apnea, she was referred to see Dr. Brett Fairy for an unrelated neurological concern, but can also discuss need for repeat sleep test with her This visit occurred during the SARS-CoV-2 public health emergency.  Safety protocols were in place, including screening questions prior to the visit, additional usage of staff PPE, and  extensive cleaning of exam room while observing appropriate contact time as indicated for disinfecting solutions.    Signed Lamar Blinks, MD   Received labs as below, message to patient  Results for orders placed or performed in visit on 11/14/19  TSH  Result Value Ref Range   TSH 1.19 0.35 - 4.50 uIU/mL  Lipid panel  Result Value Ref Range   Cholesterol 169 0 - 200 mg/dL   Triglycerides 82.0 0 - 149 mg/dL   HDL 75.40 >39.00 mg/dL   VLDL 16.4 0.0 - 40.0 mg/dL   LDL Cholesterol 77 0 - 99 mg/dL   Total CHOL/HDL Ratio 2    NonHDL 56.43   Basic metabolic panel  Result Value Ref Range   Sodium 139 135 - 145 mEq/L   Potassium 4.3 3.5 - 5.1 mEq/L   Chloride 103 96 - 112 mEq/L   CO2 30 19 - 32 mEq/L   Glucose, Bld 85 70 - 99 mg/dL   BUN 11 6 - 23 mg/dL   Creatinine, Ser 0.83 0.40 - 1.20 mg/dL   GFR 83.47 >60.00 mL/min   Calcium 9.6 8.4 - 10.5 mg/dL

## 2019-11-14 ENCOUNTER — Ambulatory Visit: Admitting: Family Medicine

## 2019-11-14 ENCOUNTER — Other Ambulatory Visit: Payer: Self-pay

## 2019-11-14 ENCOUNTER — Encounter: Payer: Self-pay | Admitting: Family Medicine

## 2019-11-14 VITALS — BP 130/90 | HR 71 | Resp 17 | Ht 64.0 in | Wt 222.0 lb

## 2019-11-14 DIAGNOSIS — Z1329 Encounter for screening for other suspected endocrine disorder: Secondary | ICD-10-CM | POA: Diagnosis not present

## 2019-11-14 DIAGNOSIS — Z0000 Encounter for general adult medical examination without abnormal findings: Secondary | ICD-10-CM

## 2019-11-14 DIAGNOSIS — R252 Cramp and spasm: Secondary | ICD-10-CM | POA: Diagnosis not present

## 2019-11-14 DIAGNOSIS — R0602 Shortness of breath: Secondary | ICD-10-CM

## 2019-11-14 DIAGNOSIS — Z1159 Encounter for screening for other viral diseases: Secondary | ICD-10-CM

## 2019-11-14 DIAGNOSIS — E669 Obesity, unspecified: Secondary | ICD-10-CM

## 2019-11-14 DIAGNOSIS — Z114 Encounter for screening for human immunodeficiency virus [HIV]: Secondary | ICD-10-CM

## 2019-11-14 DIAGNOSIS — E782 Mixed hyperlipidemia: Secondary | ICD-10-CM | POA: Diagnosis not present

## 2019-11-14 LAB — BASIC METABOLIC PANEL
BUN: 11 mg/dL (ref 6–23)
CO2: 30 mEq/L (ref 19–32)
Calcium: 9.6 mg/dL (ref 8.4–10.5)
Chloride: 103 mEq/L (ref 96–112)
Creatinine, Ser: 0.83 mg/dL (ref 0.40–1.20)
GFR: 83.47 mL/min (ref >60.00)
Glucose, Bld: 85 mg/dL (ref 70–99)
Potassium: 4.3 mEq/L (ref 3.5–5.1)
Sodium: 139 mEq/L (ref 135–145)

## 2019-11-14 LAB — LIPID PANEL
Cholesterol: 169 mg/dL (ref 0–200)
HDL: 75.4 mg/dL (ref >39.00)
LDL Cholesterol: 77 mg/dL (ref 0–99)
NonHDL: 93.6
Total CHOL/HDL Ratio: 2
Triglycerides: 82 mg/dL (ref 0.0–149.0)
VLDL: 16.4 mg/dL (ref 0.0–40.0)

## 2019-11-14 LAB — TSH: TSH: 1.19 u[IU]/mL (ref 0.35–4.50)

## 2019-11-14 MED ORDER — ALBUTEROL SULFATE HFA 108 (90 BASE) MCG/ACT IN AERS: 2.0000 | INHALATION_SPRAY | Freq: Four times a day (QID) | RESPIRATORY_TRACT | 2 refills | Status: DC | PRN

## 2019-11-14 MED ORDER — SIMVASTATIN 20 MG PO TABS: 20.0000 mg | ORAL_TABLET | Freq: Every day | ORAL | 3 refills | Status: DC

## 2019-11-15 LAB — HIV ANTIBODY (ROUTINE TESTING W REFLEX): HIV 1&2 Ab, 4th Generation: NONREACTIVE

## 2019-11-15 LAB — HEPATITIS C ANTIBODY
Hepatitis C Ab: NONREACTIVE
SIGNAL TO CUT-OFF: 0.01 (ref <1.00)

## 2019-11-19 ENCOUNTER — Ambulatory Visit: Admitting: Neurology

## 2019-11-19 ENCOUNTER — Encounter: Payer: Self-pay | Admitting: Neurology

## 2019-11-19 VITALS — BP 151/98 | HR 88 | Ht 64.0 in | Wt 223.0 lb

## 2019-11-19 DIAGNOSIS — R438 Other disturbances of smell and taste: Secondary | ICD-10-CM

## 2019-11-19 DIAGNOSIS — G4710 Hypersomnia, unspecified: Secondary | ICD-10-CM

## 2019-11-19 DIAGNOSIS — R439 Unspecified disturbances of smell and taste: Secondary | ICD-10-CM | POA: Diagnosis not present

## 2019-11-19 DIAGNOSIS — G4730 Sleep apnea, unspecified: Secondary | ICD-10-CM

## 2019-11-19 DIAGNOSIS — R430 Anosmia: Secondary | ICD-10-CM | POA: Diagnosis not present

## 2019-11-19 DIAGNOSIS — U099 Post covid-19 condition, unspecified: Secondary | ICD-10-CM

## 2019-11-19 DIAGNOSIS — G4733 Obstructive sleep apnea (adult) (pediatric): Secondary | ICD-10-CM

## 2019-11-19 DIAGNOSIS — R432 Parageusia: Secondary | ICD-10-CM

## 2019-11-19 NOTE — Progress Notes (Signed)
SLEEP MEDICINE CLINIC    Provider:  Larey Seat, MD  Primary Care Physician:  Darreld Mclean, [address] [place] [address]     Referring Provider: Darreld Mclean, [address],  [place] [address]          Chief Complaint according to patient   Patient presents with:    . New Patient (Initial Visit)           HISTORY OF PRESENT ILLNESS:  Margaret Davis is a 49 y.o. female and seen here upon a referral from Dr. Lorelei Pont for a neuro consultation-  11-19-2019;  Chief concern according to patient :  I lost smell and taste after contracting COVID 19 in 2020, Dec 31 st. No hospitalization, just no energy, strength, all 4 family members were infected, sinus headaches. Only Mrs. Tramel had pneumonia, was SOB.     Margaret Davis is a right -handed 59 or [place] American female with a known OSA  sleep disorder. She  has a past medical history of Anxiety, Elevated cholesterol, GERD (gastroesophageal reflux disease), Mass, Panic attacks, Sleep apnea, and Syncope. Her CPAP machine broke about 24 month ago, before the pandemic , but she feels she could sleep better if treated.  The patient had the first sleep study in the year 2010  with a result of OSA- done @ the former [facility] and Sleep.   Sleep relevant medical history: Dr. Virgina Jock has seen her for syncope.     Family medical /sleep history:No other family member on CPAP with OSA, insomnia, sleep walkers.    Social history: Patient is working as Automotive engineer and lives in a household with spouse and 2 children, 75 and 10 at this time.  The patient currently works full time.  Tobacco use: none  .  ETOH use; social ,  Caffeine intake in form of Coffee( /) Soda( /) Tea ( /) no energy drinks. Regular exercise in form of walking.    Sleep habits are as follows: The patient's dinner time is between 7-8 PM. The patient goes to bed at 11.30 PM and  continues to sleep for 5-6 hours.   The preferred sleep position is on her left side , with the support of 1 pillow on an inclined head of bed. . Dreams are reportedly frequent/vivid.  Tv is in the bedroom.  5.45  AM is the usual rise time. The patient wakes up with an alarm.  She reports not feeling refreshed or restored in AM, with symptoms such as dry mouth , morning headaches, and residual fatigue. Naps are taken ion weekends- falling asleep frequently, lasting from 15-30 minutes and are more refreshing than nocturnal sleep.    Review of Systems: Out of a complete 14 system review, the patient complains of only the following symptoms, and all other reviewed systems are negative.:  Fatigue, sleepiness , snoring, fragmented sleep,    How likely are you to doze in the following situations: 0 = not likely, 1 = slight chance, 2 = moderate chance, 3 = high chance   Sitting and Reading? Watching Television? Sitting inactive in a public place (theater or meeting)? As a passenger in a car for an hour without a break? Lying down in the afternoon when circumstances permit? Sitting and talking to someone? Sitting quietly after lunch without alcohol? In a car, while stopped for a few minutes in traffic?  Total = 11/ 24 points   FSS endorsed at 19/ 63 points.   Social History   Socioeconomic History  . Marital status: Married    Spouse name: Not on file  . Number of children: Not on file  . Years of education: Not on file  . Highest education level: Not on file  Occupational History  . Not on file  Tobacco Use  . Smoking status: Never Smoker  . Smokeless tobacco: Never Used  Vaping Use  . Vaping Use: Never used  Substance and Sexual Activity  . Alcohol use: No    Comment: rarely  . Drug use: No  . Sexual activity: Yes    Birth control/protection: Surgical  Other Topics Concern  . Not on file  Social History Narrative  . Not on file   Social Determinants of Health    Financial Resource Strain:   . Difficulty of Paying Living Expenses: Not on file  Food Insecurity:   . Worried About Charity fundraiser in the Last Year: Not on file  . Ran Out of Food in the Last Year: Not on file  Transportation Needs:   . Lack of Transportation (Medical): Not on file  . Lack of Transportation (Non-Medical): Not on file  Physical Activity:   . Days of Exercise per Week: Not on file  . Minutes of Exercise per Session: Not on file  Stress:   . Feeling of Stress : Not on file  Social Connections:   . Frequency of Communication with Friends and Family: Not on file  . Frequency of Social Gatherings with Friends and Family: Not on file  . Attends Religious Services: Not on file  . Active Member of Clubs or Organizations: Not on file  . Attends Archivist Meetings: Not on file  . Marital Status: Not on file    Family History  Problem Relation Age of Onset  . Diabetes Mother   . Colon polyps Mother        benign  . Hypertension Father   . Colon cancer Neg Hx   . Esophageal cancer Neg Hx     Past Medical History:  Diagnosis Date  . Anxiety   . Elevated cholesterol   . GERD (gastroesophageal reflux disease)    OCCASIONAL  . Mass    RIGHT OVARY  --ABDOMINAL PAIN  . Panic attacks   . Sleep apnea   . Syncope    PT STATES HER HEART AND BRAIN "MISFIRE" IF OVERLY HEATED OR UPSET--AND PT WOULD PASS OUT --NO EPISODES SINCE 2000  - NO MEDICATIONS--PT NO LONGER SEES CARDIOLOGIST    Past Surgical History:  Procedure Laterality Date  . ABDOMINAL HYSTERECTOMY  07/2011   partical  . COLONOSCOPY  04/2019  . WISDOM TOOTH EXTRACTION       Current Outpatient Medications on File Prior to Visit  Medication Sig Dispense Refill  . albuterol (VENTOLIN HFA) 108 (90 Base) MCG/ACT inhaler Inhale 2 puffs into the lungs every 6 (six) hours as needed for wheezing or shortness of breath. 1 each 2  . cholecalciferol (VITAMIN D) 1000 UNITS tablet Take 1,000 Units by  mouth daily.    . clonazePAM (KLONOPIN) 0.5 MG tablet TAKE 1 TABLET BY MOUTH AS NEEDED FOR ANXIETY 60 tablet 1  . co-enzyme Q-10 30 MG capsule Take 200 mg by mouth daily.     Marland Kitchen EPINEPHrine (EPIPEN 2-PAK) 0.3 mg/0.3 mL IJ SOAJ injection Inject 0.3 mg into the muscle as needed for anaphylaxis.  1 each prn  . esomeprazole (NEXIUM) 40 MG capsule Take 40 mg by mouth. ONCE A DAY  IF NEEDED    . estradiol (ESTRACE) 0.1 MG/GM vaginal cream Place 0.5 g vaginally 2 (two) times a week.     . loratadine (CLARITIN) 10 MG tablet Take 10 mg by mouth daily.    . simvastatin (ZOCOR) 20 MG tablet Take 1 tablet (20 mg total) by mouth at bedtime. 90 tablet 3   No current facility-administered medications on file prior to visit.    Allergies  Allergen Reactions  . Brent    Patient reports she "can drink OJ without a problem."   . Latex Hives    CONTACT WITH LATEX CAUSES HIVES  . Omeprazole Other (See Comments)    Abdominal pain  . Other     CAN'T EAT ORANGES-BUT CAN DRINK OJ  . Phenobarbital     AS ACHILD--THOUGHT TO BE HAVING SEIZURES--HAD ALLERGIC REACTION TO PHENOBARBITAL.   LATER TOLD SHE DID NOT HAVE SEIZURES    Physical exam:  Today's Vitals   11/19/19 1405  BP: (!) 151/98  Pulse: 88  Weight: 223 lb (101.2 kg)  Height: 5\' 4"  (1.626 m)   Body mass index is 38.28 kg/m.   Wt Readings from Last 3 Encounters:  11/19/19 223 lb (101.2 kg)  11/14/19 222 lb (100.7 kg)  08/16/19 229 lb (103.9 kg)     Ht Readings from Last 3 Encounters:  11/19/19 5\' 4"  (1.626 m)  11/14/19 5\' 4"  (1.626 m)  09/10/19 5\' 4"  (1.626 m)      General: The patient is awake, alert and appears not in acute distress. The patient is well groomed. Head: Normocephalic, atraumatic. Neck is supple. Mallampati; 3 plus,  neck circumference:16 inches . Nasal airflow patent.  Retrognathia is not seen.  Dental status: intact  Cardiovascular:  Regular rate and cardiac rhythm by pulse,  without distended neck  veins. Respiratory: Lungs are clear to auscultation.  Skin:  Without evidence of ankle edema, or rash. Trunk: The patient's posture is erect.   Neurologic exam : The patient is awake and alert, oriented to place and time.   Memory subjective described as intact.  Attention span & concentration ability appears normal.  Speech is fluent, without  dysarthria, dysphonia or aphasia.  Mood and affect are appropriate.   Cranial nerves:  loss of smell or taste reported - fully vaccinated  Pupils are equal and briskly reactive to light. Funduscopic exam deferred.  Extraocular movements in vertical and horizontal planes were intact and without nystagmus. No Diplopia. Visual fields by finger perimetry are intact. Hearing was intact to soft voice and finger rubbing. Facial sensation intact to fine touch. Facial motor strength is symmetric and tongue and uvula move midline. Neck ROM : rotation, tilt and flexion extension were normal for age and shoulder shrug was symmetrical.  Motor exam:  Symmetric bulk, tone and ROM.   Normal tone without cog wheeling, symmetric grip strength . Sensory:  Fine touch, pinprick and vibration were tested  and  normal.  Proprioception tested in the upper extremities was normal. Coordination: Rapid alternating movements in the fingers/hands were of normal speed.  The Finger-to-nose maneuver was intact without evidence of ataxia, dysmetria or tremor. Gait and station: Patient could rise unassisted from a seated position, walked without assistive device.  Stance is of normal width/ base - turned with 3 steps.  Toe and heel walk were deferred.  Deep tendon reflexes: in the upper  and lower extremities are symmetric and intact.  Babinski response was deferred.      After spending a total time of 45 minutes face to face and additional time for physical and neurologic examination, review of laboratory studies,  personal review of imaging studies, reports and results of other  testing and review of referral information / records as far as provided in visit, I have established the following assessments:  1) Anosmia for floral and spices, not for peppermint and vanilla, coconut. Couldn't smell tobacco smoke last week.   2) untreated sleep apnea.   3) patient is also  sleep restricted - TV on and spending only 6 hours in bed .    My Plan is to proceed with:  1) HST to get to the baseline of Sleep Apnea.  2) [employer] school- creating neuro-pathway associations.    I would like to thank Copland, Gay Filler, MD and Copland, Gay Filler, [address],  [place] [address] for allowing me to meet with and to take care of this pleasant patient.   In short, Margaret Davis is presenting with untreated OSA, obesity , and anosmia, parosmia.  I plan to follow up either personally or through our NP within 3 month.   CC: I will share my notes with PCP   Electronically signed by: Larey Seat, MD 11/19/2019 2:42 PM  [facility] and [employer] Board certified by The [employer] of Sleep Medicine and Diplomate of the [employer] of Sleep Medicine. Board certified In Neurology through the [place], Fellow of the [employer] of Neurology. Medical Director of [employer].

## 2019-11-19 NOTE — Patient Instructions (Signed)

## 2019-11-21 ENCOUNTER — Telehealth: Payer: Self-pay

## 2019-11-21 NOTE — Telephone Encounter (Signed)
LVM for pt to call me back to schedule sleep study  

## 2019-11-27 ENCOUNTER — Encounter: Payer: Self-pay | Admitting: Family Medicine

## 2019-11-27 DIAGNOSIS — R0602 Shortness of breath: Secondary | ICD-10-CM

## 2019-11-28 NOTE — Addendum Note (Signed)
Addended by: Lamar Blinks C on: 11/28/2019 09:01 PM   Modules accepted: Orders

## 2019-12-09 ENCOUNTER — Ambulatory Visit (INDEPENDENT_AMBULATORY_CARE_PROVIDER_SITE_OTHER): Admitting: Neurology

## 2019-12-09 DIAGNOSIS — R432 Parageusia: Secondary | ICD-10-CM

## 2019-12-09 DIAGNOSIS — R439 Unspecified disturbances of smell and taste: Secondary | ICD-10-CM

## 2019-12-09 DIAGNOSIS — R430 Anosmia: Secondary | ICD-10-CM

## 2019-12-09 DIAGNOSIS — G4710 Hypersomnia, unspecified: Secondary | ICD-10-CM

## 2019-12-09 DIAGNOSIS — G4733 Obstructive sleep apnea (adult) (pediatric): Secondary | ICD-10-CM | POA: Diagnosis not present

## 2019-12-09 DIAGNOSIS — R438 Other disturbances of smell and taste: Secondary | ICD-10-CM

## 2019-12-17 NOTE — Progress Notes (Signed)
   [facility]  HOME SLEEP TEST (Watch PAT)  STUDY DATE: 12/09/19- data loaded 12/17/2019  DOB: May 24, 1970  MRN: [id number]  ORDERING CLINICIAN: Larey Seat, MD   REFERRING CLINICIANVirgina Jock, MD and Copland, Gay Filler, MD   CLINICAL INFORMATION/HISTORY:  [name] isa right -handed  African American female with a known OSA sleep disorder.she is status post COVID 19 infection. Reports having lost energy and exercise tolerance since the infection, waking up with headaches in the morning. She has a medical history of Anxiety, Elevated Cholesterol, GERD (gastroesophageal reflux disease), Panic attacks and Syncope. Her CPAP machine broke about 24 month ago, before the pandemic, but she feels she could sleep better if treated for OSA. The patient had the first sleep study in the year 2010 at the former [facility] and Sleep. Dr. Virgina Jock has seen her for syncope.   Epworth sleepiness score: 11/24.  BMI: 38.0 kg/m  FINDINGS:   Total Record Time (hours, min): 7 h59 min  Total Sleep Time (hours, min):  7 h10 min   Percent REM (%):    18.02 %   Calculated pAHI (per hour):  17.0      REM pAHI:    35.2     NREM pAHI: 13.0 Supine AHI: N/A   Oxygen Saturation (%) Mean: 94  Minimum oxygen saturation (%):        81   O2 Saturation Range (%): 81-99  O2Saturation (minutes) <=88%: 3.4 min   Pulse Mean (bpm):    70  Pulse Range (53-95)   IMPRESSION: This HST confirms the presence of Mild- moderate OSA (obstructive sleep apnea) with an AHI of 17/h and REM AHI of 35.2/h.  Minimal hypoxia for total of 3.4 minutes, not clinically relevant.  This HST did not record any sleep positional data or snoring volume.    RECOMMENDATION:  Since the patient has already CPAP therapy experience and is not CPAP intolerant, I will use CPAP autotitration device with a setting from 5 through 15 cm water, 2 cm EPR and mask of her choice , with heated  humidification.    INTERPRETING PHYSICIAN:  Larey Seat, MD  [facility] and [facility] Sleep Board certified by The [employer] of Sleep Medicine and Diplomate of the [employer] of Sleep Medicine. Board certified In Neurology through the [place], Fellow of the [employer] of Neurology. Medical Director of [employer].

## 2019-12-19 ENCOUNTER — Encounter: Payer: Self-pay | Admitting: Neurology

## 2019-12-19 DIAGNOSIS — G4710 Hypersomnia, unspecified: Secondary | ICD-10-CM | POA: Insufficient documentation

## 2019-12-19 DIAGNOSIS — R439 Unspecified disturbances of smell and taste: Secondary | ICD-10-CM | POA: Insufficient documentation

## 2019-12-19 DIAGNOSIS — R430 Anosmia: Secondary | ICD-10-CM | POA: Insufficient documentation

## 2019-12-19 DIAGNOSIS — R432 Parageusia: Secondary | ICD-10-CM | POA: Insufficient documentation

## 2019-12-19 DIAGNOSIS — R438 Other disturbances of smell and taste: Secondary | ICD-10-CM | POA: Insufficient documentation

## 2019-12-19 NOTE — Addendum Note (Signed)
Addended by: Larey Seat on: 12/19/2019 05:22 PM   Modules accepted: Orders

## 2019-12-19 NOTE — Progress Notes (Signed)
IMPRESSION: This HST confirms the presence of Mild- moderate OSA (obstructive sleep apnea) with an AHI of 17/h and REM AHI of 35.2/h.   Minimal hypoxia for total time of only 3.4 minutes, not clinically relevant.  This HST did not record any sleep positional data or snoring volume.    RECOMMENDATION:  Since the patient has already CPAP therapy experience and is not CPAP intolerant, I will use CPAP autotitration device with a setting from 5 through 15 cm water, 2 cm EPR and mask of her choice , with heated humidification.

## 2019-12-19 NOTE — Procedures (Signed)
[facility]UROLOGIC [facility]OCIATES  HOME SLEEP TEST (Watch PAT)  STUDY DATE: 12/09/19- data loaded 12/17/2019  DOB: [birth year]  MRN: [id number]  ORDERING CLINICIAN: Larey Seat, MD   REFERRING CLINICIANVirgina Jock, MD and Copland, Gay Filler, MD   CLINICAL INFORMATION/HISTORY:  [name] isa right -handed  African American female with a known OSA sleep disorder.she is status post COVID 19 infection. Reports having lost energy and exercise tolerance since the infection, waking up with headaches in the morning. She has a medical history of Anxiety, Elevated Cholesterol, GERD (gastroesophageal reflux disease), Panic attacks and Syncope. Her CPAP machine broke about 24 month ago, before the pandemic, but she feels she could sleep better if treated for OSA. The patient had the first sleep study in the year 2010 at the former [facility] and Sleep. Dr. Virgina Jock has seen her for syncope.   Epworth sleepiness score: 11/24.  BMI: 38.0 kg/m  FINDINGS:   Total Record Time (hours, min): 7 h59 min  Total Sleep Time (hours, min):  7 h10 min   Percent REM (%):    18.02 %   Calculated pAHI (per hour):  17.0      REM pAHI:    35.2     NREM pAHI: 13.0 Supine AHI: N/A   Oxygen Saturation (%) Mean: 94  Minimum oxygen saturation (%):        81   O2 Saturation Range (%): 81-99  O2Saturation (minutes) <=88%: 3.4 min   Pulse Mean (bpm):    70  Pulse Range (53-95)   IMPRESSION: This HST confirms the presence of Mild- moderate OSA (obstructive sleep apnea) with an AHI of 17/h and REM AHI of 35.2/h.  Minimal hypoxia for total of 3.4 minutes, not clinically relevant.  This HST did not record any sleep positional data or snoring volume.    RECOMMENDATION:  Since the patient has already CPAP therapy experience and is not CPAP intolerant, I will use CPAP autotitration device with a setting from 5 through 15 cm water, 2 cm EPR and mask of her choice , with heated humidification.     INTERPRETING PHYSICIAN:  Larey Seat, MD  [facility] and [facility] Sleep Board certified by The [employer] of Sleep Medicine and Diplomate of the [employer] of Sleep Medicine.

## 2019-12-25 ENCOUNTER — Encounter: Payer: Self-pay | Admitting: Cardiology

## 2019-12-25 ENCOUNTER — Other Ambulatory Visit: Payer: Self-pay

## 2019-12-25 ENCOUNTER — Ambulatory Visit: Admitting: Cardiology

## 2019-12-25 VITALS — BP 153/101 | HR 71 | Resp 16 | Ht 64.0 in | Wt 225.2 lb

## 2019-12-25 DIAGNOSIS — R0609 Other forms of dyspnea: Secondary | ICD-10-CM

## 2019-12-25 DIAGNOSIS — R0600 Dyspnea, unspecified: Secondary | ICD-10-CM | POA: Insufficient documentation

## 2019-12-25 DIAGNOSIS — I10 Essential (primary) hypertension: Secondary | ICD-10-CM | POA: Insufficient documentation

## 2019-12-25 HISTORY — DX: Essential (primary) hypertension: I10

## 2019-12-25 MED ORDER — AMLODIPINE BESYLATE 5 MG PO TABS: 5.0000 mg | ORAL_TABLET | Freq: Every day | ORAL | 3 refills | Status: DC

## 2019-12-25 NOTE — Progress Notes (Signed)
Follow up visit  Subjective:   Margaret Davis, female    DOB: Jun 04, 1970, 49 y.o.   MRN: [id number]   HPI  Chief Complaint  Patient presents with  . DOE  . Follow-up    Referred by Lamar Blinks, MD    49 year old African-American female with hypertension, OSA on CPAP  Patient was last seen in 2019.  Work-up with echocardiogram and event monitor was essentially unremarkable.  Patient had Covid in December 2020.  Since then, she is at shortness of breath on exertion.  This is gotten particularly worse over the last few weeks.  She denies any chest pain.  Blood pressure is elevated today.  She denies any orthopnea, PND, leg edema.   Current Outpatient Medications on File Prior to Visit  Medication Sig Dispense Refill  . albuterol (VENTOLIN HFA) 108 (90 Base) MCG/ACT inhaler Inhale 2 puffs into the lungs every 6 (six) hours as needed for wheezing or shortness of breath. 1 each 2  . cholecalciferol (VITAMIN D) 1000 UNITS tablet Take 1,000 Units by mouth daily.    . clonazePAM (KLONOPIN) 0.5 MG tablet TAKE 1 TABLET BY MOUTH AS NEEDED FOR ANXIETY 60 tablet 1  . co-enzyme Q-10 30 MG capsule Take 200 mg by mouth daily.     Marland Kitchen EPINEPHrine (EPIPEN 2-PAK) 0.3 mg/0.3 mL IJ SOAJ injection Inject 0.3 mg into the muscle as needed for anaphylaxis. 1 each prn  . esomeprazole (NEXIUM) 40 MG capsule Take 40 mg by mouth. ONCE A DAY  IF NEEDED    . estradiol (ESTRACE) 0.1 MG/GM vaginal cream Place 0.5 g vaginally 2 (two) times a week.     . loratadine (CLARITIN) 10 MG tablet Take 10 mg by mouth daily.    . simvastatin (ZOCOR) 20 MG tablet Take 1 tablet (20 mg total) by mouth at bedtime. 90 tablet 3   No current facility-administered medications on file prior to visit.    Cardiovascular & other pertient studies:  EKG 12/25/2019: Sinus rhythm 71 bpm  Low voltage in precordial leads  Mobile cardiac telemetry 07/2017: Dominant rhythm sinus.  Rate 54-137 bpm. No arrhythmias  noted.  Echocardiogram 2019: EF 58%.  Grade 1 diastolic dysfunction. Aneurysmal interatrial septum without PFO. Mild tricuspid regurgitation.  PASP 26 mmHg.  Recent labs: 11/11/20211: Glucose 85, BUN/Cr 11/0.83. EGFR 83. Na/K 139/4.3.  Chol 169, TG 82, HDL 75, LDL 77 TSH 1.1  normal  05/2019: H/H 13/39. MCV 91. Platelets 350   Review of Systems  Cardiovascular: Positive for dyspnea on exertion. Negative for chest pain, leg swelling, palpitations and syncope.         Vitals:   12/25/19 0953  BP: (!) 153/101  Pulse: 71  Resp: 16  SpO2: 95%    Body mass index is 38.66 kg/m. Filed Weights   12/25/19 0953  Weight: 225 lb 3.2 oz (102.2 kg)     Objective:   Physical Exam Vitals and nursing note reviewed.  Constitutional:      General: She is not in acute distress. Neck:     Vascular: No JVD.  Cardiovascular:     Rate and Rhythm: Normal rate and regular rhythm.     Heart sounds: Normal heart sounds. No murmur heard.   Pulmonary:     Effort: Pulmonary effort is normal.     Breath sounds: Normal breath sounds. No wheezing or rales.  Musculoskeletal:     Right lower leg: No edema.     Left lower leg: Edema (Trace)  present.           Assessment & Recommendations:   49 year old African-American female with hypertension, OSA on CPAP, now with exertional dyspnea  Exertional dyspnea: Suspect this could be secondary to uncontrolled hypertension, although angina equivalent cannot be excluded.  Will obtain echocardiogram and DEXA scan nuclear stress test, along with CT cardiac scoring.    Hypertension:  Started amlodipine 5 mg daily.   Follow-up after above testing.   Nigel Mormon, MD Pager: [phone] Office: [phone]

## 2019-12-31 ENCOUNTER — Ambulatory Visit

## 2019-12-31 ENCOUNTER — Other Ambulatory Visit: Payer: Self-pay

## 2019-12-31 DIAGNOSIS — I10 Essential (primary) hypertension: Secondary | ICD-10-CM

## 2019-12-31 DIAGNOSIS — R0600 Dyspnea, unspecified: Secondary | ICD-10-CM

## 2019-12-31 DIAGNOSIS — R0609 Other forms of dyspnea: Secondary | ICD-10-CM

## 2020-01-01 ENCOUNTER — Other Ambulatory Visit

## 2020-01-07 ENCOUNTER — Ambulatory Visit: Admitting: Pulmonary Disease

## 2020-01-07 ENCOUNTER — Other Ambulatory Visit: Payer: Self-pay

## 2020-01-07 ENCOUNTER — Encounter: Payer: Self-pay | Admitting: Pulmonary Disease

## 2020-01-07 VITALS — BP 130/86 | HR 88 | Temp 98.4°F | Ht 64.0 in | Wt 222.3 lb

## 2020-01-07 DIAGNOSIS — R0600 Dyspnea, unspecified: Secondary | ICD-10-CM

## 2020-01-07 DIAGNOSIS — G4733 Obstructive sleep apnea (adult) (pediatric): Secondary | ICD-10-CM | POA: Diagnosis not present

## 2020-01-07 DIAGNOSIS — R0609 Other forms of dyspnea: Secondary | ICD-10-CM

## 2020-01-07 NOTE — Patient Instructions (Signed)
We will schedule you for pulmonary function testing and call you with the results. We will discuss potential inhaler therapy at that time.

## 2020-01-07 NOTE — Progress Notes (Signed)
Synopsis: Referred in 01/2020 for shortness of breath  Subjective:   PATIENT ID: Margaret Davis GENDER: female DOB: 1970/05/15, MRN: [id number]   HPI  Chief Complaint  Patient presents with  . Consult    Referred by PCP Dr. Patsy Lager for SOB. Was diagnosed with covid on 01/03/19. Noticed the SOB after COVID. Was diagnosed with PNA in February/March 2021. DOE.     Margaret Davis is a 50 year old woman, never smoker with history of sleep apnea, hypertension and hyperlipidemia who is referred to pulmonary clinic for evaluation of shortness of breath.   She reports being sick with covid 12/2018 and has noticed exertional dyspnea that has not improved since. Once she returned to work, she usually takes the stairs and noticed persistent dyspnea with climbing stairs that did not improve. She was provided an albuterol inhaler around 02/2019 with some improvement in her breathing but continued to be short of breath with exertion. Her dyspnea has progressively worsened over the past 2 months prompting referral to our clinic.    She was diagnosed with moderate obstructive sleep apnea last month with an AHI of 17/h and REM AHI of 35.2/hr. She has been prescribed a CPAP machine but due to supply chain issues she has not received her machine yet.   She denies cough, wheezing, chest tightness. She denies lower extremity edema. She denies night time awakenings due to shortness of breath.   Her grandmother and aunt on her father's side both had pulmonary fibrosis and passed away at the age of 67 and 44 respectively.   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 Possible asbestos exposure in old school buildings.  Past Medical History:  Diagnosis Date  . Anxiety   . Elevated cholesterol   . Essential hypertension 12/25/2019  . GERD (gastroesophageal reflux disease)    OCCASIONAL  . Mass    RIGHT OVARY  --ABDOMINAL PAIN  . Panic attacks   . Sleep apnea   . Syncope     PT STATES HER HEART AND BRAIN "MISFIRE" IF OVERLY HEATED OR UPSET--AND PT WOULD PASS OUT --NO EPISODES SINCE 2000  - NO MEDICATIONS--PT NO LONGER SEES CARDIOLOGIST     Family History  Problem Relation Age of Onset  . Diabetes Mother   . Colon polyps Mother        benign  . Hypertension Father   . Colon cancer Neg Hx   . Esophageal cancer Neg Hx      Social History   Socioeconomic History  . Marital status: Married    Spouse name: Not on file  . Number of children: Not on file  . Years of education: Not on file  . Highest education level: Not on file  Occupational History  . Not on file  Tobacco Use  . Smoking status: Never Smoker  . Smokeless tobacco: Never Used  Vaping Use  . Vaping Use: Never used  Substance and Sexual Activity  . Alcohol use: No    Comment: rarely  . Drug use: No  . Sexual activity: Yes    Birth control/protection: Surgical  Other Topics Concern  . Not on file  Social History Narrative  . Not on file   Social Determinants of Health   Financial Resource Strain: Not on file  Food Insecurity: Not on file  Transportation Needs: Not on file  Physical Activity: Not on file  Stress: Not on file  Social Connections: Not on file  Intimate Partner Violence:  Not on file     Allergies  Allergen Reactions  . [place]    Patient reports she "can drink OJ without a problem."   . Latex Hives    CONTACT WITH LATEX CAUSES HIVES  . Omeprazole Other (See Comments)    Abdominal pain  . Other     CAN'T EAT ORANGES-BUT CAN DRINK OJ  . Phenobarbital     AS ACHILD--THOUGHT TO BE HAVING SEIZURES--HAD ALLERGIC REACTION TO PHENOBARBITAL.   LATER TOLD SHE DID NOT HAVE SEIZURES     Outpatient Medications Prior to Visit  Medication Sig Dispense Refill  . albuterol (VENTOLIN HFA) 108 (90 Base) MCG/ACT inhaler Inhale 2 puffs into the lungs every 6 (six) hours as needed for wheezing or shortness of breath. 1 each 2  . amLODipine (NORVASC) 5 MG tablet  Take 1 tablet (5 mg total) by mouth daily. 30 tablet 3  . cholecalciferol (VITAMIN D) 1000 UNITS tablet Take 1,000 Units by mouth daily.    . clonazePAM (KLONOPIN) 0.5 MG tablet TAKE 1 TABLET BY MOUTH AS NEEDED FOR ANXIETY 60 tablet 1  . co-enzyme Q-10 30 MG capsule Take 200 mg by mouth daily.     Marland Kitchen EPINEPHrine (EPIPEN 2-PAK) 0.3 mg/0.3 mL IJ SOAJ injection Inject 0.3 mg into the muscle as needed for anaphylaxis. 1 each prn  . esomeprazole (NEXIUM) 40 MG capsule Take 40 mg by mouth. ONCE A DAY  IF NEEDED    . estradiol (ESTRACE) 0.1 MG/GM vaginal cream Place 0.5 g vaginally 2 (two) times a week.     . loratadine (CLARITIN) 10 MG tablet Take 10 mg by mouth daily.    . simvastatin (ZOCOR) 20 MG tablet Take 1 tablet (20 mg total) by mouth at bedtime. 90 tablet 3   No facility-administered medications prior to visit.    Review of Systems  Constitutional: Positive for malaise/fatigue. Negative for chills, fever and weight loss.  HENT: Negative for congestion, sinus pain and sore throat.   Eyes: Negative.   Respiratory: Positive for shortness of breath. Negative for cough, hemoptysis, sputum production and wheezing.   Cardiovascular: Negative for chest pain, palpitations, orthopnea, claudication, leg swelling and PND.  Gastrointestinal: Negative for abdominal pain, diarrhea, heartburn, nausea and vomiting.  Genitourinary: Negative.   Musculoskeletal: Negative.   Skin: Negative for rash.  Neurological: Negative.   Endo/Heme/Allergies: Negative.   Psychiatric/Behavioral: Negative.    Objective:   Vitals:   01/07/20 1606  BP: 130/86  Pulse: 88  Temp: 98.4 F (36.9 C)  TempSrc: Temporal  SpO2: 99%  Weight: 222 lb 4.8 oz (100.8 kg)  Height: 5\' 4"  (1.626 m)     Physical Exam Constitutional:      General: She is not in acute distress.    Appearance: Normal appearance. She is obese. She is not ill-appearing.  HENT:     Head: Normocephalic and atraumatic.  Eyes:     General: No  scleral icterus.    Conjunctiva/sclera: Conjunctivae normal.     Pupils: Pupils are equal, round, and reactive to light.  Cardiovascular:     Rate and Rhythm: Normal rate and regular rhythm.     Pulses: Normal pulses.     Heart sounds: Normal heart sounds. No murmur heard.   Pulmonary:     Effort: Pulmonary effort is normal.     Breath sounds: Normal breath sounds. No wheezing, rhonchi or rales.  Abdominal:     General: Bowel sounds are normal.  Palpations: Abdomen is soft.  Musculoskeletal:     Right lower leg: No edema.     Left lower leg: No edema.  Skin:    General: Skin is warm and dry.     Capillary Refill: Capillary refill takes less than 2 seconds.  Neurological:     General: No focal deficit present.     Mental Status: She is alert.  Psychiatric:        Mood and Affect: Mood normal.        Behavior: Behavior normal.        Thought Content: Thought content normal.        Judgment: Judgment normal.    CBC    Component Value Date/Time   WBC 4.7 05/22/2019 1112   RBC 4.38 05/22/2019 1112   HGB 13.6 05/22/2019 1112   HCT 39.9 05/22/2019 1112   PLT 350.0 05/22/2019 1112   MCV 91.2 05/22/2019 1112   MCH 30.2 08/19/2013 1010   MCHC 34.2 05/22/2019 1112   RDW 13.5 05/22/2019 1112   LYMPHSABS 1.5 09/06/2012 1225   MONOABS 0.5 09/06/2012 1225   EOSABS 0.2 09/06/2012 1225   BASOSABS 0.0 09/06/2012 1225     Chest imaging: CTA Chest 02/15/19 Mediastinum/Nodes: Thyroid appears borderline prominent and mildly inhomogeneous without dominant mass. No thoracic adenopathy evident. No esophageal lesions.  Lungs/Pleura: There are scattered areas of atelectatic change in the bases. There is subtle ill-defined opacity in the posterior segment of the left upper lobe. There is no airspace consolidation. No pleural effusions evident bilaterally.  PFT: No flowsheet data found.  ECHO 12/31/19 Normal LV systolic function with visual EF 60-65%. Left ventricle cavity  is  normal in size. Normal global wall motion. Normal diastolic filling  pattern, normal LAP. Calculated EF 60%.  Mild (Grade I) mitral regurgitation.  Mild tricuspid regurgitation. No evidence of pulmonary hypertension.  Compared to prior study dated 2019 no significant changes noted.     Assessment & Plan:   Dyspnea on exertion - Plan: Pulmonary Function Test  Obstructive sleep apnea  Discussion: Margaret Davis is a 49 year old woman, never smoker with history of sleep apnea, hypertension and hyperlipidemia who is referred to pulmonary clinic for evaluation of shortness of breath.   She has normal cardiac echo function. She does not have clinical history concerning for asthma or obstructive lung disease. She does have family history for pulmonary fibrosis in her paternal aunt and grandmother. She also had covid 19 in 12/2018, so she could possibly have suffered lung damage from that infection. She also has untreated moderate obstructive sleep apnea at this time which could be contributing to her dyspnea.   We will obtain pulmonary function tests to further evaluate her dyspnea on exertion and discuss the results over the phone with her.  Follow up in 3 months  Melody Comas, MD [facility] Office: [phone]   See Amion for Pager Details    Current Outpatient Medications:  .  albuterol (VENTOLIN HFA) 108 (90 Base) MCG/ACT inhaler, Inhale 2 puffs into the lungs every 6 (six) hours as needed for wheezing or shortness of breath., Disp: 1 each, Rfl: 2 .  amLODipine (NORVASC) 5 MG tablet, Take 1 tablet (5 mg total) by mouth daily., Disp: 30 tablet, Rfl: 3 .  cholecalciferol (VITAMIN D) 1000 UNITS tablet, Take 1,000 Units by mouth daily., Disp: , Rfl:  .  clonazePAM (KLONOPIN) 0.5 MG tablet, TAKE 1 TABLET BY MOUTH AS NEEDED FOR ANXIETY, Disp: 60  tablet, Rfl: 1 .  co-enzyme Q-10 30 MG capsule, Take 200 mg by mouth daily. , Disp: , Rfl:  .  EPINEPHrine (EPIPEN  2-PAK) 0.3 mg/0.3 mL IJ SOAJ injection, Inject 0.3 mg into the muscle as needed for anaphylaxis., Disp: 1 each, Rfl: prn .  esomeprazole (NEXIUM) 40 MG capsule, Take 40 mg by mouth. ONCE A DAY  IF NEEDED, Disp: , Rfl:  .  estradiol (ESTRACE) 0.1 MG/GM vaginal cream, Place 0.5 g vaginally 2 (two) times a week. , Disp: , Rfl:  .  loratadine (CLARITIN) 10 MG tablet, Take 10 mg by mouth daily., Disp: , Rfl:  .  simvastatin (ZOCOR) 20 MG tablet, Take 1 tablet (20 mg total) by mouth at bedtime., Disp: 90 tablet, Rfl: 3

## 2020-01-13 ENCOUNTER — Other Ambulatory Visit: Payer: Self-pay

## 2020-01-13 ENCOUNTER — Ambulatory Visit

## 2020-01-13 DIAGNOSIS — R0600 Dyspnea, unspecified: Secondary | ICD-10-CM

## 2020-01-13 DIAGNOSIS — R0609 Other forms of dyspnea: Secondary | ICD-10-CM

## 2020-01-16 ENCOUNTER — Encounter: Payer: Self-pay | Admitting: Family Medicine

## 2020-01-16 MED ORDER — CLONAZEPAM 0.5 MG PO TABS: ORAL_TABLET | ORAL | 2 refills | Status: DC

## 2020-01-21 ENCOUNTER — Encounter: Payer: Self-pay | Admitting: Neurology

## 2020-01-24 ENCOUNTER — Encounter: Payer: Self-pay | Admitting: Family Medicine

## 2020-01-25 NOTE — Progress Notes (Signed)
[facility] [address] [place], [place] [address] [phone]  Date:  01/27/2020   Name:  Margaret Davis   DOB:  [birth year]   MRN:  [id number]  PCP:  Darreld Mclean, MD    Chief Complaint: No chief complaint on file.   History of Present Illness:  Margaret Davis is a 50 y.o. very pleasant female patient who presents with the following:  Video visit today for concern of illness Connected with pt via video Patient location is home, my location is office.  The patient and myself are present on the call today.  Patient identity confirmed with 2 factors, she gives consent for virtual visit today  Margaret Davis contacted [place] on the 21st with the following message Hi! I currently have a scratchy throat and a cough. I was tested for Covid yesterday and it was negative. I have been at home since Saturday, so I figured that was not it. I don't have fever, but this cough is the devil and I have been drinking hot lemon tea for my throat. Dr. Mamie Nick is trying me on a high blood pressure medication, it starts with an A and I know that I have to be careful with medications because of it. Ca you give me any advice on what I can take or should I schedule an appointment with you?   COVID-19 vaccine, flu shot up-to-date  Pt notes that she had congested ears and some scratchy throat, ST last week She took tylenol sinus which did seem to help She took a covid test on Thursday (today is Monday) and it was negative-however was a rapid test only, no PCR  She did have covid a year ago.  Symptoms today are much less severe than they were last year She does notice a dry cough, sinus pressure She is not aware of any fever   Patient Active Problem List   Diagnosis Date Noted  . Essential hypertension 12/25/2019  . Dyspnea on exertion 12/25/2019  . 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 12/19/2019  . [place] 12/19/2019   . Anosmia 12/19/2019  . Sleep apnea with hypersomnolence 12/19/2019  . Olfactory impairment 09/10/2019  . History of COVID-19 09/10/2019  . Hyperlipidemia 02/14/2019  . OSA (obstructive sleep apnea) 02/04/2013  . Obesity (BMI 30-39.9) 02/04/2013  . Syncope   . GERD (gastroesophageal reflux disease)   . Panic attacks   . Routine gynecological examination 06/22/2012    Past Medical History:  Diagnosis Date  . Anxiety   . Elevated cholesterol   . Essential hypertension 12/25/2019  . GERD (gastroesophageal reflux disease)    OCCASIONAL  . Mass    RIGHT OVARY  --ABDOMINAL PAIN  . Panic attacks   . Sleep apnea   . Syncope    PT STATES HER HEART AND BRAIN "MISFIRE" IF OVERLY HEATED OR UPSET--AND PT WOULD PASS OUT --NO EPISODES SINCE 2000  - NO MEDICATIONS--PT NO LONGER SEES CARDIOLOGIST    Past Surgical History:  Procedure Laterality Date  . ABDOMINAL HYSTERECTOMY  07/2011   partical  . COLONOSCOPY  04/2019  . WISDOM TOOTH EXTRACTION      Social History   Tobacco Use  . Smoking status: Never Smoker  . Smokeless tobacco: Never Used  Vaping Use  . Vaping Use: Never used  Substance Use Topics  . Alcohol use: No    Comment: rarely  . Drug use: No  Family History  Problem Relation Age of Onset  . Diabetes Mother   . Colon polyps Mother        benign  . Hypertension Father   . Colon cancer Neg Hx   . Esophageal cancer Neg Hx     Allergies  Allergen Reactions  . [place]    Patient reports she "can drink OJ without a problem."   . Latex Hives    CONTACT WITH LATEX CAUSES HIVES  . Omeprazole Other (See Comments)    Abdominal pain  . Other     CAN'T EAT ORANGES-BUT CAN DRINK OJ  . Phenobarbital     AS ACHILD--THOUGHT TO BE HAVING SEIZURES--HAD ALLERGIC REACTION TO PHENOBARBITAL.   LATER TOLD SHE DID NOT HAVE SEIZURES    Medication list has been reviewed and updated.  Current Outpatient Medications on File Prior to Visit  Medication Sig Dispense  Refill  . albuterol (VENTOLIN HFA) 108 (90 Base) MCG/ACT inhaler Inhale 2 puffs into the lungs every 6 (six) hours as needed for wheezing or shortness of breath. 1 each 2  . amLODipine (NORVASC) 5 MG tablet Take 1 tablet (5 mg total) by mouth daily. 30 tablet 3  . cholecalciferol (VITAMIN D) 1000 UNITS tablet Take 1,000 Units by mouth daily.    . clonazePAM (KLONOPIN) 0.5 MG tablet TAKE 1 TABLET BY MOUTH AS NEEDED FOR ANXIETY 60 tablet 2  . co-enzyme Q-10 30 MG capsule Take 200 mg by mouth daily.     Marland Kitchen EPINEPHrine (EPIPEN 2-PAK) 0.3 mg/0.3 mL IJ SOAJ injection Inject 0.3 mg into the muscle as needed for anaphylaxis. 1 each prn  . esomeprazole (NEXIUM) 40 MG capsule Take 40 mg by mouth. ONCE A DAY  IF NEEDED    . estradiol (ESTRACE) 0.1 MG/GM vaginal cream Place 0.5 g vaginally 2 (two) times a week.     . loratadine (CLARITIN) 10 MG tablet Take 10 mg by mouth daily.    . simvastatin (ZOCOR) 20 MG tablet Take 1 tablet (20 mg total) by mouth at bedtime. 90 tablet 3   No current facility-administered medications on file prior to visit.    Review of Systems:  As per HPI- otherwise negative.   Physical Examination: There were no vitals filed for this visit. There were no vitals filed for this visit. There is no height or weight on file to calculate BMI. Ideal Body Weight:    Patient observed via video monitor.  She looks well, her normal self.  No cough, wheezing, or distress is noted   Assessment and Plan: Sinus pressure - Plan: cefdinir (OMNICEF) 300 MG capsule  Virtual visit today for concern of illness.  Video used for the duration of visit today.  Patient has noticed symptoms of ear pressure, sinus pressure, sore throat, fatigue, cough.  No fever noted.  Advised her that it is still possible she has COVID-19 or possibly the flu.  Encouraged her to be tested for these illnesses if she is able, discussed some possible testing locations.  Assuming these are negative, will plan to treat  her for sinus infection/bronchitis with cefdinir I have asked her to keep me posted on how she is doing, let me know if I can do nothing else to help Video used for duration of visit today This visit occurred during the SARS-CoV-2 public health emergency.  Safety protocols were in place, including screening questions prior to the visit, additional usage of staff PPE, and extensive cleaning of exam room while  observing appropriate contact time as indicated for disinfecting solutions.    Signed Lamar Blinks, MD

## 2020-01-27 ENCOUNTER — Other Ambulatory Visit: Payer: Self-pay

## 2020-01-27 ENCOUNTER — Telehealth (INDEPENDENT_AMBULATORY_CARE_PROVIDER_SITE_OTHER): Admitting: Family Medicine

## 2020-01-27 ENCOUNTER — Other Ambulatory Visit

## 2020-01-27 DIAGNOSIS — Z1152 Encounter for screening for COVID-19: Secondary | ICD-10-CM

## 2020-01-27 DIAGNOSIS — J3489 Other specified disorders of nose and nasal sinuses: Secondary | ICD-10-CM

## 2020-01-27 MED ORDER — CEFDINIR 300 MG PO CAPS: 300.0000 mg | ORAL_CAPSULE | Freq: Two times a day (BID) | ORAL | 0 refills | Status: DC

## 2020-01-28 ENCOUNTER — Other Ambulatory Visit

## 2020-01-28 LAB — SARS-COV-2, NAA 2 DAY TAT

## 2020-01-28 LAB — NOVEL CORONAVIRUS, NAA: SARS-CoV-2, NAA: NOT DETECTED

## 2020-01-30 ENCOUNTER — Ambulatory Visit: Admitting: Cardiology

## 2020-02-10 ENCOUNTER — Ambulatory Visit: Admitting: Cardiology

## 2020-02-10 ENCOUNTER — Other Ambulatory Visit: Payer: Self-pay

## 2020-02-10 ENCOUNTER — Encounter: Payer: Self-pay | Admitting: Cardiology

## 2020-02-10 VITALS — BP 110/64 | HR 86 | Temp 97.6°F | Resp 16 | Ht 64.0 in | Wt 223.0 lb

## 2020-02-10 DIAGNOSIS — R0600 Dyspnea, unspecified: Secondary | ICD-10-CM

## 2020-02-10 DIAGNOSIS — R0609 Other forms of dyspnea: Secondary | ICD-10-CM

## 2020-02-10 DIAGNOSIS — I10 Essential (primary) hypertension: Secondary | ICD-10-CM

## 2020-02-10 MED ORDER — AMLODIPINE BESYLATE 2.5 MG PO TABS: 5.0000 mg | ORAL_TABLET | Freq: Every day | ORAL | 3 refills | Status: DC

## 2020-02-10 NOTE — Progress Notes (Signed)
Follow up visit  Subjective:   Margaret Davis, female    DOB: [birth year], 50 y.o.   MRN: [id number]   HPI   Chief Complaint  Patient presents with  . Hypertension  . Follow-up  . Results    CT    50 year old African-American female with hypertension, OSA on CPAP, exertional dyspnea  Echocardiogram with mild MR, mild TR. Calcium score 0. Blood pressure is well controlled on amlodipine 5 mg. She hopes that she can come off it, as it startetd only after her COVID.   Current Outpatient Medications on File Prior to Visit  Medication Sig Dispense Refill  . amLODipine (NORVASC) 5 MG tablet Take 1 tablet (5 mg total) by mouth daily. 30 tablet 3  . cholecalciferol (VITAMIN D) 1000 UNITS tablet Take 1,000 Units by mouth daily.    . clonazePAM (KLONOPIN) 0.5 MG tablet TAKE 1 TABLET BY MOUTH AS NEEDED FOR ANXIETY 60 tablet 2  . co-enzyme Q-10 30 MG capsule Take 200 mg by mouth daily.     Marland Kitchen EPINEPHrine (EPIPEN 2-PAK) 0.3 mg/0.3 mL IJ SOAJ injection Inject 0.3 mg into the muscle as needed for anaphylaxis. 1 each prn  . esomeprazole (NEXIUM) 40 MG capsule Take 40 mg by mouth. ONCE A DAY  IF NEEDED    . loratadine (CLARITIN) 10 MG tablet Take 10 mg by mouth daily.    . simvastatin (ZOCOR) 20 MG tablet Take 1 tablet (20 mg total) by mouth at bedtime. 90 tablet 3   No current facility-administered medications on file prior to visit.    Cardiovascular & other pertient studies:  CT cardiac scoring 01/16/2019: Calcium score 0 Rest normal  Echocardiogram 12/31/2019:  Normal LV systolic function with visual EF 60-65%. Left ventricle cavity  is normal in size. Normal global wall motion. Normal diastolic filling  pattern, normal LAP. Calculated EF 60%.  Mild (Grade I) mitral regurgitation.  Mild tricuspid regurgitation. No evidence of pulmonary hypertension.  Compared to prior study dated 2019 no significant changes noted.   EKG 12/25/2019: Sinus rhythm 71 bpm  Low voltage in  precordial leads  Mobile cardiac telemetry 07/2017: Dominant rhythm sinus.  Rate 54-137 bpm. No arrhythmias noted.  Recent labs: 11/11/20211: Glucose 85, BUN/Cr 11/0.83. EGFR 83. Na/K 139/4.3.  Chol 169, TG 82, HDL 75, LDL 77 TSH 1.1  normal  05/2019: H/H 13/39. MCV 91. Platelets 350   Review of Systems  Cardiovascular: Positive for dyspnea on exertion. Negative for chest pain, leg swelling, palpitations and syncope.         Vitals:   02/10/20 1326  BP: 110/64  Pulse: 86  Resp: 16  Temp: 97.6 F (36.4 C)  SpO2: 100%     Body mass index is 38.28 kg/m. Filed Weights   02/10/20 1326  Weight: 223 lb (101.2 kg)     Objective:   Physical Exam Vitals and nursing note reviewed.  Constitutional:      General: She is not in acute distress. Neck:     Vascular: No JVD.  Cardiovascular:     Rate and Rhythm: Normal rate and regular rhythm.     Heart sounds: Normal heart sounds. No murmur heard.   Pulmonary:     Effort: Pulmonary effort is normal.     Breath sounds: Normal breath sounds. No wheezing or rales.  Musculoskeletal:     Right lower leg: No edema.     Left lower leg: Edema (Trace) present.  Assessment & Recommendations:   50 year old African-American female with hypertension, OSA on CPAP, exertional dyspnea  Exertional dyspnea: Echocardiogram with mild MR, mild TR. Calcium score 0.  Suspect this could be secondary to uncontrolled hypertension Improving now  Hypertension:  Reduce amlodipine to 2.5 mg daily. Encouraged low salt diet, regular exercise, weight loss.  F/u in 3 months. Hopefully, she can come off it then    Nigel Mormon, MD Pager: [phone] Office: [phone]

## 2020-02-11 ENCOUNTER — Other Ambulatory Visit (HOSPITAL_COMMUNITY)
Admission: RE | Admit: 2020-02-11 | Discharge: 2020-02-11 | Disposition: A | Discharge status: Home / Self Care | Source: Ambulatory Visit | Attending: Cardiology | Admitting: Cardiology

## 2020-02-11 DIAGNOSIS — Z01812 Encounter for preprocedural laboratory examination: Secondary | ICD-10-CM | POA: Insufficient documentation

## 2020-02-11 DIAGNOSIS — Z20822 Contact with and (suspected) exposure to covid-19: Secondary | ICD-10-CM | POA: Insufficient documentation

## 2020-02-11 LAB — SARS CORONAVIRUS 2 (TAT 6-24 HRS): SARS Coronavirus 2: NEGATIVE

## 2020-02-14 ENCOUNTER — Other Ambulatory Visit: Payer: Self-pay

## 2020-02-14 ENCOUNTER — Ambulatory Visit (INDEPENDENT_AMBULATORY_CARE_PROVIDER_SITE_OTHER): Admitting: Pulmonary Disease

## 2020-02-14 DIAGNOSIS — R0600 Dyspnea, unspecified: Secondary | ICD-10-CM | POA: Diagnosis not present

## 2020-02-14 DIAGNOSIS — R0609 Other forms of dyspnea: Secondary | ICD-10-CM

## 2020-02-14 LAB — PULMONARY FUNCTION TEST
DL/VA % pred: 129 %
DL/VA: 5.59 ml/min/mmHg/L
DLCO cor % pred: 78 %
DLCO cor: 16.74 ml/min/mmHg
DLCO unc % pred: 78 %
DLCO unc: 16.74 ml/min/mmHg
FEF 25-75 Post: 3.37 L/sec
FEF 25-75 Pre: 2.69 L/sec
FEF2575-%Change-Post: 25 %
FEF2575-%Pred-Post: 133 %
FEF2575-%Pred-Pre: 106 %
FEV1-%Change-Post: 2 %
FEV1-%Pred-Post: 93 %
FEV1-%Pred-Pre: 91 %
FEV1-Post: 2.22 L
FEV1-Pre: 2.16 L
FEV1FVC-%Change-Post: 7 %
FEV1FVC-%Pred-Pre: 104 %
FEV6-%Change-Post: 0 %
FEV6-%Pred-Post: 84 %
FEV6-%Pred-Pre: 84 %
FEV6-Post: 2.42 L
FEV6-Pre: 2.41 L
FEV6FVC-%Pred-Post: 102 %
FEV6FVC-%Pred-Pre: 102 %
FVC-%Change-Post: -4 %
FVC-%Pred-Post: 82 %
FVC-%Pred-Pre: 85 %
FVC-Post: 2.42 L
FVC-Pre: 2.53 L
Post FEV1/FVC ratio: 92 %
Post FEV6/FVC ratio: 100 %
Pre FEV1/FVC ratio: 85 %
Pre FEV6/FVC Ratio: 100 %
RV % pred: 64 %
RV: 1.14 L
TLC % pred: 80 %
TLC: 4.07 L

## 2020-02-14 NOTE — Progress Notes (Signed)
Full PFT performed today. °

## 2020-02-20 ENCOUNTER — Encounter: Payer: Self-pay | Admitting: Family Medicine

## 2020-02-22 NOTE — Patient Instructions (Addendum)
Good to see you again today!  I think you have plantar fascitis on the right and possibly a morton's neuroma on the left Try stretching before getting out of bed, heel cup on the right, ice massage Topical voltaren gel may be helpful, also ok to use ibuprofen or naproxen for a few days if you would like Wear supportive, well padded shoes to work- athletic shoes, or you might go to a place like the [employer] in [place] to be fitted to support dress shoes If not improving in a few weeks please alert me- Sooner if worse.

## 2020-02-22 NOTE — Progress Notes (Signed)
[name] [address] [place], [place] [address] [phone]  Date:  02/26/2020   Name:  Margaret Davis   DOB:  08-Aug-1970   MRN:  [id number]  PCP:  Darreld Mclean, MD    Chief Complaint: Foot Pain (Left foot pain, ball of foot, right foot-heel pain, no known injury, started about a month ago/)   History of Present Illness:  Margaret Davis is a 50 y.o. very pleasant female patient who presents with the following:  Pt seen today with concern of a cough and also foot pain  History of hypertension, OSA on CPAP, exertional dyspnea, covid 17 a year ago She recently sent me the following mychart message  Hi! I am having some issues with foot pain in both feet. I'm on them a lot. The right heel hurts a lot when I put pressure on it. I have been alternating between my flats and sneakers. They feel better until I sit and when I get up, the right heel hurts and I have to give myself a minute before trying to walk. I am going to buy some insoles for my shoes but wanted to get some advice from you.  Seen by cardiology earlier this month  covid series UTD Flu done Pap- s/p hysterectomy  Her left foot hurts more in the ball of the foot and the 2nd toe, the right heel They have bothered her for about a month She walks a lot on hard, concrete floors Worse first thing in the am or if sitting for a while - right foot The left hurts "whenever" She tried some insoles but they are too large for her shoes She generally wears dress flats or boots to her job Tennis shoes do help some   NKI No increase in her general exercise Sx started after she came back from winter break  She has not tried antiinflammatories OTC  She is taking amlodipine for her BP  BP Readings from Last 3 Encounters:  02/26/20 126/82  02/10/20 110/64  01/07/20 130/86     Patient Active Problem List   Diagnosis Date Noted  . Essential  hypertension 12/25/2019  . Dyspnea on exertion 12/25/2019  . 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 12/19/2019  . Ageusia 12/19/2019  . Anosmia 12/19/2019  . Sleep apnea with hypersomnolence 12/19/2019  . Olfactory impairment 09/10/2019  . History of COVID-19 09/10/2019  . Hyperlipidemia 02/14/2019  . OSA (obstructive sleep apnea) 02/04/2013  . Obesity (BMI 30-39.9) 02/04/2013  . Syncope   . GERD (gastroesophageal reflux disease)   . Panic attacks   . Routine gynecological examination 06/22/2012    Past Medical History:  Diagnosis Date  . Anxiety   . Elevated cholesterol   . Essential hypertension 12/25/2019  . GERD (gastroesophageal reflux disease)    OCCASIONAL  . Mass    RIGHT OVARY  --ABDOMINAL PAIN  . Panic attacks   . Sleep apnea   . Syncope    PT STATES HER HEART AND BRAIN "MISFIRE" IF OVERLY HEATED OR UPSET--AND PT WOULD PASS OUT --NO EPISODES SINCE 2000  - NO MEDICATIONS--PT NO LONGER SEES CARDIOLOGIST    Past Surgical History:  Procedure Laterality Date  . ABDOMINAL HYSTERECTOMY  07/2011   partical  . COLONOSCOPY  04/2019  . WISDOM TOOTH EXTRACTION      Social History   Tobacco Use  . Smoking status: Never Smoker  .  Smokeless tobacco: Never Used  Vaping Use  . Vaping Use: Never used  Substance Use Topics  . Alcohol use: No    Comment: rarely  . Drug use: No    Family History  Problem Relation Age of Onset  . Diabetes Mother   . Colon polyps Mother        benign  . Hypertension Father   . Colon cancer Neg Hx   . Esophageal cancer Neg Hx     Allergies  Allergen Reactions  . [place]    Patient reports she "can drink OJ without a problem."   . Latex Hives    CONTACT WITH LATEX CAUSES HIVES  . Omeprazole Other (See Comments)    Abdominal pain  . Other     CAN'T EAT ORANGES-BUT CAN DRINK OJ  . Phenobarbital     AS ACHILD--THOUGHT TO BE HAVING SEIZURES--HAD ALLERGIC REACTION TO PHENOBARBITAL.   LATER TOLD  SHE DID NOT HAVE SEIZURES    Medication list has been reviewed and updated.  Current Outpatient Medications on File Prior to Visit  Medication Sig Dispense Refill  . amLODipine (NORVASC) 2.5 MG tablet Take 2 tablets (5 mg total) by mouth daily. 90 tablet 3  . cholecalciferol (VITAMIN D) 1000 UNITS tablet Take 1,000 Units by mouth daily.    . clonazePAM (KLONOPIN) 0.5 MG tablet TAKE 1 TABLET BY MOUTH AS NEEDED FOR ANXIETY 60 tablet 2  . co-enzyme Q-10 30 MG capsule Take 200 mg by mouth daily.     Marland Kitchen EPINEPHrine (EPIPEN 2-PAK) 0.3 mg/0.3 mL IJ SOAJ injection Inject 0.3 mg into the muscle as needed for anaphylaxis. 1 each prn  . esomeprazole (NEXIUM) 40 MG capsule Take 40 mg by mouth. ONCE A DAY  IF NEEDED    . loratadine (CLARITIN) 10 MG tablet Take 10 mg by mouth daily.    . simvastatin (ZOCOR) 20 MG tablet Take 1 tablet (20 mg total) by mouth at bedtime. 90 tablet 3   No current facility-administered medications on file prior to visit.    Review of Systems:  As per HPI- otherwise negative.   Physical Examination: Vitals:   02/26/20 0904  BP: 126/82  Pulse: 65  Resp: 17  SpO2: 95%   Vitals:   02/26/20 0904  Weight: 225 lb (102.1 kg)  Height: 5\' 4"  (1.626 m)   Body mass index is 38.62 kg/m. Ideal Body Weight: Weight in (lb) to have BMI = 25: 145.3  GEN: no acute distress.  Obese, otherwise looks well HEENT: Atraumatic, Normocephalic.  Ears and Nose: No external deformity. CV: RRR, No M/G/R. No JVD. No thrill. No extra heart sounds. PULM: CTA B, no wheezes, crackles, rhonchi. No retractions. No resp. distress. No accessory muscle use. EXTR: No c/c/e PSYCH: Normally interactive. Conversant.  Both feet are well cared for, normal pulses and circulation.  No cracks or calluses The right foot is tender at the proximal insertion of the plantar fascia, consistent with plantar fasciitis. The left foot display some tenderness at the base of the second toe.  There is a borderline  positive squeeze test to suggest Morton's neuroma   Assessment and Plan: Pain in both feet  Seen today with complaint of pain in bilateral feet.  She seems to actually have 2 separate issues going on.  We discussed both in detail Plantar fasciitis right foot-discussed stretching, ice massage, can roll.  Encouraged her to use supportive shoes such as athletic shoes and try heel cups.  We  discussed referring her for injection, she prefers to try conservative care at this time Left foot pain seems more consistent with Morton's neuroma.  Again, we discussed conservative care options for this pain I advised her that she likely cannot wear traditional "dress shoes" during her long days at work.  I recommend she use athletic shoes or she might invest in a comfort footwear brand of dress shoe-perhaps visit [employer] (IT) in [place] for advice  We also discussed a trial Voltaren gel for her left toe pain.  She can use over-the-counter NSAIDs judiciously as needed.  For the time being Claudene declines referral to orthopedics or podiatry.  She will try the conservative measures we have discussed above and let me know if things are not improving This visit occurred during the SARS-CoV-2 public health emergency.  Safety protocols were in place, including screening questions prior to the visit, additional usage of staff PPE, and extensive cleaning of exam room while observing appropriate contact time as indicated for disinfecting solutions.    Signed Lamar Blinks, MD

## 2020-02-24 ENCOUNTER — Ambulatory Visit: Admitting: Cardiology

## 2020-02-26 ENCOUNTER — Other Ambulatory Visit: Payer: Self-pay

## 2020-02-26 ENCOUNTER — Ambulatory Visit: Admitting: Family Medicine

## 2020-02-26 ENCOUNTER — Encounter: Payer: Self-pay | Admitting: Family Medicine

## 2020-02-26 VITALS — BP 126/82 | HR 65 | Resp 17 | Ht 64.0 in | Wt 225.0 lb

## 2020-02-26 DIAGNOSIS — M79672 Pain in left foot: Secondary | ICD-10-CM | POA: Diagnosis not present

## 2020-02-26 DIAGNOSIS — M79671 Pain in right foot: Secondary | ICD-10-CM

## 2020-03-11 ENCOUNTER — Encounter: Payer: Self-pay | Admitting: Family Medicine

## 2020-03-21 ENCOUNTER — Other Ambulatory Visit: Payer: Self-pay | Admitting: Cardiology

## 2020-03-21 DIAGNOSIS — I10 Essential (primary) hypertension: Secondary | ICD-10-CM

## 2020-03-24 ENCOUNTER — Other Ambulatory Visit: Payer: Self-pay | Admitting: Cardiology

## 2020-03-24 DIAGNOSIS — I10 Essential (primary) hypertension: Secondary | ICD-10-CM

## 2020-04-16 ENCOUNTER — Encounter: Payer: Self-pay | Admitting: Family Medicine

## 2020-04-16 DIAGNOSIS — E041 Nontoxic single thyroid nodule: Secondary | ICD-10-CM

## 2020-04-22 ENCOUNTER — Other Ambulatory Visit: Payer: Self-pay

## 2020-04-22 ENCOUNTER — Encounter: Payer: Self-pay | Admitting: Family Medicine

## 2020-04-22 ENCOUNTER — Ambulatory Visit (HOSPITAL_BASED_OUTPATIENT_CLINIC_OR_DEPARTMENT_OTHER)
Admission: RE | Admit: 2020-04-22 | Discharge: 2020-04-22 | Disposition: A | Discharge status: Home / Self Care | Source: Ambulatory Visit | Attending: Family Medicine | Admitting: Family Medicine

## 2020-04-22 DIAGNOSIS — E041 Nontoxic single thyroid nodule: Secondary | ICD-10-CM | POA: Diagnosis present

## 2020-04-22 DIAGNOSIS — F410 Panic disorder [episodic paroxysmal anxiety] without agoraphobia: Secondary | ICD-10-CM

## 2020-04-22 DIAGNOSIS — E042 Nontoxic multinodular goiter: Secondary | ICD-10-CM

## 2020-04-23 MED ORDER — CLONAZEPAM 0.5 MG PO TABS: ORAL_TABLET | ORAL | 1 refills | Status: DC

## 2020-04-23 NOTE — Addendum Note (Signed)
Addended by: Lamar Blinks C on: 04/23/2020 08:58 AM   Modules accepted: Orders

## 2020-04-30 ENCOUNTER — Ambulatory Visit
Admission: RE | Admit: 2020-04-30 | Discharge: 2020-04-30 | Disposition: A | Discharge status: Home / Self Care | Source: Ambulatory Visit | Attending: Family Medicine | Admitting: Family Medicine

## 2020-04-30 ENCOUNTER — Other Ambulatory Visit: Payer: Self-pay

## 2020-04-30 ENCOUNTER — Other Ambulatory Visit (HOSPITAL_COMMUNITY)
Admission: RE | Admit: 2020-04-30 | Discharge: 2020-04-30 | Disposition: A | Discharge status: Home / Self Care | Source: Ambulatory Visit | Attending: Family Medicine | Admitting: Family Medicine

## 2020-04-30 DIAGNOSIS — E042 Nontoxic multinodular goiter: Secondary | ICD-10-CM | POA: Insufficient documentation

## 2020-05-01 ENCOUNTER — Encounter: Payer: Self-pay | Admitting: Family Medicine

## 2020-05-01 LAB — CYTOLOGY - NON PAP

## 2020-05-02 ENCOUNTER — Other Ambulatory Visit: Payer: Self-pay | Admitting: Family Medicine

## 2020-05-02 ENCOUNTER — Encounter: Payer: Self-pay | Admitting: Family Medicine

## 2020-05-02 DIAGNOSIS — E042 Nontoxic multinodular goiter: Secondary | ICD-10-CM

## 2020-05-08 ENCOUNTER — Other Ambulatory Visit (HOSPITAL_COMMUNITY)
Admission: RE | Admit: 2020-05-08 | Discharge: 2020-05-08 | Disposition: A | Discharge status: Home / Self Care | Source: Ambulatory Visit | Attending: Family Medicine | Admitting: Family Medicine

## 2020-05-08 ENCOUNTER — Ambulatory Visit
Admission: RE | Admit: 2020-05-08 | Discharge: 2020-05-08 | Disposition: A | Discharge status: Home / Self Care | Source: Ambulatory Visit | Attending: Family Medicine | Admitting: Family Medicine

## 2020-05-08 DIAGNOSIS — E042 Nontoxic multinodular goiter: Secondary | ICD-10-CM

## 2020-05-08 DIAGNOSIS — D34 Benign neoplasm of thyroid gland: Secondary | ICD-10-CM | POA: Diagnosis not present

## 2020-05-08 DIAGNOSIS — E041 Nontoxic single thyroid nodule: Secondary | ICD-10-CM | POA: Diagnosis present

## 2020-05-11 ENCOUNTER — Encounter: Payer: Self-pay | Admitting: Family Medicine

## 2020-05-11 DIAGNOSIS — E042 Nontoxic multinodular goiter: Secondary | ICD-10-CM

## 2020-05-11 LAB — CYTOLOGY - NON PAP

## 2020-05-13 ENCOUNTER — Encounter: Payer: Self-pay | Admitting: Cardiology

## 2020-05-13 ENCOUNTER — Other Ambulatory Visit: Payer: Self-pay

## 2020-05-13 ENCOUNTER — Ambulatory Visit: Admitting: Cardiology

## 2020-05-13 VITALS — BP 136/79 | HR 68 | Temp 98.0°F | Resp 16 | Ht 64.0 in | Wt 226.0 lb

## 2020-05-13 DIAGNOSIS — I10 Essential (primary) hypertension: Secondary | ICD-10-CM

## 2020-05-13 DIAGNOSIS — Z9989 Dependence on other enabling machines and devices: Secondary | ICD-10-CM

## 2020-05-13 DIAGNOSIS — G4733 Obstructive sleep apnea (adult) (pediatric): Secondary | ICD-10-CM

## 2020-05-13 NOTE — Progress Notes (Signed)
Follow up visit  Subjective:   Margaret Davis, female    DOB: 04/26/1970, 50 y.o.   MRN: [id number]   HPI   Chief Complaint  Patient presents with  . Hypertension  . Follow-up    3 month    50 year old African-American female with hypertension, OSA on CPAP, exertional dyspnea  Patient is doing well, denies any complaints.  She reports significant work-related stress.  Blood pressure today is 136/79 mmHg.  At home, diastolic blood pressure has been on 140 mmHg.  She is not on amlodipine anymore.  Patient recently was reevaluated for obstructive sleep apnea and is going to start working CPAP again.  Current Outpatient Medications on File Prior to Visit  Medication Sig Dispense Refill  . cholecalciferol (VITAMIN D) 1000 UNITS tablet Take 1,000 Units by mouth daily.    . clonazePAM (KLONOPIN) 0.5 MG tablet TAKE 1 TABLET BY MOUTH AS NEEDED FOR ANXIETY 60 tablet 1  . co-enzyme Q-10 30 MG capsule Take 200 mg by mouth daily.     Marland Kitchen EPINEPHrine (EPIPEN 2-PAK) 0.3 mg/0.3 mL IJ SOAJ injection Inject 0.3 mg into the muscle as needed for anaphylaxis. 1 each prn  . esomeprazole (NEXIUM) 40 MG capsule Take 40 mg by mouth. ONCE A DAY  IF NEEDED    . loratadine (CLARITIN) 10 MG tablet Take 10 mg by mouth daily.    . simvastatin (ZOCOR) 20 MG tablet Take 1 tablet (20 mg total) by mouth at bedtime. 90 tablet 3   No current facility-administered medications on file prior to visit.    Cardiovascular & other pertient studies:  CT cardiac scoring 01/16/2019: Calcium score 0 Rest normal  Echocardiogram 12/31/2019:  Normal LV systolic function with visual EF 60-65%. Left ventricle cavity  is normal in size. Normal global wall motion. Normal diastolic filling  pattern, normal LAP. Calculated EF 60%.  Mild (Grade I) mitral regurgitation.  Mild tricuspid regurgitation. No evidence of pulmonary hypertension.  Compared to prior study dated 2019 no significant changes noted.   EKG  12/25/2019: Sinus rhythm 71 bpm  Low voltage in precordial leads  Mobile cardiac telemetry 07/2017: Dominant rhythm sinus.  Rate 54-137 bpm. No arrhythmias noted.  Recent labs: 11/11/20211: Glucose 85, BUN/Cr 11/0.83. EGFR 83. Na/K 139/4.3.  Chol 169, TG 82, HDL 75, LDL 77 TSH 1.1  normal  05/2019: H/H 13/39. MCV 91. Platelets 350   Review of Systems  Cardiovascular: Positive for dyspnea on exertion. Negative for chest pain, leg swelling, palpitations and syncope.         Vitals:   05/13/20 0851  BP: 136/79  Pulse: 68  Resp: 16  Temp: 98 F (36.7 C)  SpO2: 99%     Body mass index is 38.79 kg/m. Filed Weights   05/13/20 0851  Weight: 226 lb (102.5 kg)     Objective:   Physical Exam Vitals and nursing note reviewed.  Constitutional:      General: She is not in acute distress. Neck:     Vascular: No JVD.  Cardiovascular:     Rate and Rhythm: Normal rate and regular rhythm.     Heart sounds: Normal heart sounds. No murmur heard.   Pulmonary:     Effort: Pulmonary effort is normal.     Breath sounds: Normal breath sounds. No wheezing or rales.  Musculoskeletal:     Right lower leg: No edema.     Left lower leg: Edema (Trace) present.  Assessment & Recommendations:   50 year old African-American female with hypertension, OSA on CPAP  Hypertension:  I think this may be addressed best by managing her OSA with CPAP. Encouraged low salt diet, regular exercise, weight loss. Patient would like to see me back in July when she will not be working to evaluate her blood pressure.  F/u in 07/2020   Nigel Mormon, MD Pager: [phone] Office: [phone]

## 2020-06-05 ENCOUNTER — Encounter: Payer: Self-pay | Admitting: Family Medicine

## 2020-06-05 DIAGNOSIS — F410 Panic disorder [episodic paroxysmal anxiety] without agoraphobia: Secondary | ICD-10-CM

## 2020-06-05 MED ORDER — CLONAZEPAM 0.5 MG PO TABS: ORAL_TABLET | ORAL | 1 refills | Status: DC

## 2020-06-06 ENCOUNTER — Other Ambulatory Visit: Payer: Self-pay | Admitting: Cardiology

## 2020-06-06 DIAGNOSIS — I10 Essential (primary) hypertension: Secondary | ICD-10-CM

## 2020-07-02 LAB — HM MAMMOGRAPHY

## 2020-07-03 ENCOUNTER — Encounter: Payer: Self-pay | Admitting: Family Medicine

## 2020-07-17 ENCOUNTER — Other Ambulatory Visit: Payer: Self-pay

## 2020-07-17 ENCOUNTER — Ambulatory Visit: Admitting: Cardiology

## 2020-07-17 ENCOUNTER — Encounter: Payer: Self-pay | Admitting: Cardiology

## 2020-07-17 VITALS — BP 120/88 | HR 62 | Temp 97.8°F | Ht 64.0 in | Wt 224.0 lb

## 2020-07-17 DIAGNOSIS — G4733 Obstructive sleep apnea (adult) (pediatric): Secondary | ICD-10-CM

## 2020-07-17 DIAGNOSIS — I10 Essential (primary) hypertension: Secondary | ICD-10-CM

## 2020-07-17 DIAGNOSIS — Z9989 Dependence on other enabling machines and devices: Secondary | ICD-10-CM

## 2020-07-17 NOTE — Progress Notes (Signed)
Follow up visit  Subjective:   Margaret Davis, female    DOB: [birth year], 50 y.o.   MRN: [id number]   HPI   Chief Complaint  Patient presents with   Hypertension   Follow-up    50 year old African-American female with hypertension, OSA on CPAP, exertional dyspnea  Now that the patient is off from work, she is much more relaxed. She is using CPAP regularly. Blood pressure is very well controlled.  Current Outpatient Medications on File Prior to Visit  Medication Sig Dispense Refill   cholecalciferol (VITAMIN D) 1000 UNITS tablet Take 1,000 Units by mouth daily.     clonazePAM (KLONOPIN) 0.5 MG tablet Take 1/ 2 or 1 twice daily as needed for anxiety 60 tablet 1   co-enzyme Q-10 30 MG capsule Take 200 mg by mouth daily.      EPINEPHrine (EPIPEN 2-PAK) 0.3 mg/0.3 mL IJ SOAJ injection Inject 0.3 mg into the muscle as needed for anaphylaxis. 1 each prn   esomeprazole (NEXIUM) 40 MG capsule Take 40 mg by mouth. ONCE A DAY  IF NEEDED     loratadine (CLARITIN) 10 MG tablet Take 10 mg by mouth daily.     simvastatin (ZOCOR) 20 MG tablet Take 1 tablet (20 mg total) by mouth at bedtime. 90 tablet 3   No current facility-administered medications on file prior to visit.    Cardiovascular & other pertient studies:  CT cardiac scoring 01/16/2019: Calcium score 0 Rest normal  Echocardiogram 12/31/2019:  Normal LV systolic function with visual EF 60-65%. Left ventricle cavity  is normal in size. Normal global wall motion. Normal diastolic filling  pattern, normal LAP. Calculated EF 60%.  Mild (Grade I) mitral regurgitation.  Mild tricuspid regurgitation. No evidence of pulmonary hypertension.  Compared to prior study dated 2019 no significant changes noted.   EKG 12/25/2019: Sinus rhythm 71 bpm  Low voltage in precordial leads  Mobile cardiac telemetry 07/2017: Dominant rhythm sinus.  Rate 54-137 bpm. No arrhythmias noted.  Recent labs: 11/11/20211: Glucose 85, BUN/Cr  11/0.83. EGFR 83. Na/K 139/4.3.  Chol 169, TG 82, HDL 75, LDL 77 TSH 1.1  normal  05/2019: H/H 13/39. MCV 91. Platelets 350   Review of Systems  Cardiovascular:  Negative for chest pain, dyspnea on exertion, leg swelling, palpitations and syncope.        Vitals:   07/17/20 0834  BP: 120/88  Pulse: 62  Temp: 97.8 F (36.6 C)  SpO2: 100%     Body mass index is 38.45 kg/m. Filed Weights   07/17/20 0834  Weight: 224 lb (101.6 kg)     Objective:   Physical Exam Vitals and nursing note reviewed.  Constitutional:      General: She is not in acute distress. Neck:     Vascular: No JVD.  Cardiovascular:     Rate and Rhythm: Normal rate and regular rhythm.     Heart sounds: Normal heart sounds. No murmur heard. Pulmonary:     Effort: Pulmonary effort is normal.     Breath sounds: Normal breath sounds. No wheezing or rales.  Musculoskeletal:     Right lower leg: No edema.     Left lower leg: No edema.          Assessment & Recommendations:   50 year old African-American female with hypertension, OSA on CPAP  Hypertension:  Well controlled with OSA with CPAP. Antihypertensive therapy not needed at this time.  I will see her on as needed basis.   Margaret Davis  Margaret Hardy, MD Pager: [phone] Office: [phone]

## 2020-07-22 ENCOUNTER — Ambulatory Visit: Admitting: Neurology

## 2020-10-07 ENCOUNTER — Encounter: Payer: Self-pay | Admitting: Neurology

## 2020-10-07 ENCOUNTER — Telehealth: Payer: Self-pay | Admitting: Neurology

## 2020-10-07 ENCOUNTER — Ambulatory Visit (INDEPENDENT_AMBULATORY_CARE_PROVIDER_SITE_OTHER): Admitting: Neurology

## 2020-10-07 VITALS — BP 136/79 | HR 82 | Ht 64.0 in | Wt 227.5 lb

## 2020-10-07 DIAGNOSIS — U099 Post covid-19 condition, unspecified: Secondary | ICD-10-CM

## 2020-10-07 DIAGNOSIS — R438 Other disturbances of smell and taste: Secondary | ICD-10-CM | POA: Diagnosis not present

## 2020-10-07 DIAGNOSIS — G4730 Sleep apnea, unspecified: Secondary | ICD-10-CM

## 2020-10-07 DIAGNOSIS — G4733 Obstructive sleep apnea (adult) (pediatric): Secondary | ICD-10-CM | POA: Diagnosis not present

## 2020-10-07 DIAGNOSIS — G4710 Hypersomnia, unspecified: Secondary | ICD-10-CM

## 2020-10-07 DIAGNOSIS — Z9989 Dependence on other enabling machines and devices: Secondary | ICD-10-CM

## 2020-10-07 MED ORDER — TRAZODONE HCL 50 MG PO TABS: 25.0000 mg | ORAL_TABLET | Freq: Every evening | ORAL | 3 refills | Status: DC | PRN

## 2020-10-07 NOTE — Patient Instructions (Signed)
Loss of smell, also known as ANOSMIA, is one of the most common symptoms of COVID-19.   The [facility] Professor of Otolaryngology Daphane Shepherd, MD, and Assistant Professor Annitta Jersey, MD, have investigated several treatments for persistent anosmia (loss of smell), with a special interest in viral-related anosmia.     As the number of total, confirmed COVID-19 cases increases so does the number of people suffering from disease-related anosmia, making anosmia a significant public health problem.     Rehabilitation of anosmia :   Smell daily one sample of the following categories; and look at a picture of the object - f. Example at a picture of a lemon while smelling a lemon oil.   A floral scent  - such as rose, lavender, or vanilla - all would qualify for these.  A spice scent - nutmeg, anise, coffee, cinnamon-  A citrus smell - lemon, lime, orange  A menthol or minty scent, or eucalyptus.   Goal is to re-associate scent ( and eventually taste ) to the image.  The success is gradual, and this form of rehabilitation may take 12 month to succeed.   Another complication is PAROSMIA - smelling something that is not present, often an unpleasant smell of burning rubber or spoiled , foul smells.   This can be reduced by using a carbamazepine at 100 mg po bid. This antiepileptic medication slows the nerve conduction and allows the reduction in abnormal smell sensation.    Larey Seat, MD

## 2020-10-07 NOTE — Telephone Encounter (Signed)
Pt called 29min before their appt to inform us that she will be about 5+ min late. Pt was informed that she may be asked to r/s once she reached here. Front Desk was informed.

## 2020-10-07 NOTE — Progress Notes (Signed)
SLEEP MEDICINE CLINIC    Provider:  Larey Seat, MD  Primary Care Physician:  Darreld Mclean, [address] [place] [address]     Referring Provider: Darreld Mclean, [address],  [place] [address]          Chief Complaint according to patient   Patient presents with:     New Patient (Initial Visit)     136/ 79 mmHg and pulse is 82 bpm.       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 She is tearful as she reports her ageusia and anosmia sensory deficits.  She presented today for the first time since her sleep study, HST and start of CPAP therapy. Her home sleep test study date 12-09-2019 resulted in a total sleep time of 7 hours 10 minutes with 80% REM sleep, AHI was 17 which is a milder form of sleep apnea but during REM sleep AHI became 35.2 so this was not REM dependent form of sleep apnea requiring CPAP therapy.  He did not have prolonged hypoxia episodes and as far as I can see there was no positional component noted.  I ordered an auto titration device with a setting for pressure between 5 and 15 cmH2O to centimeter EPR and a mask of her choice with heated humidification, the patient has used her CPAP 31 out of 36 days or a compliance of 87% average user time 6 hours 27 minutes, 95th percentile pressure is 7.3 cmH2O, average air leak is 4.1 L/min, and her residual AHI was 0.6/h this is a very good resolution of sleep apnea. She feels more energy at work, but feels able to sleep well. She has trazodone available for those nights.    I repeated a smell test today- 10-07-2020 Cloves- did not identify it, but smelled something.  Lemon identified. Coconut identified. Almond - not smell identified, no taste sensation.  Lavender identified. Peppermint identified.   Grapefruit identified as CITRUS.     TIMI Davis is a 50 y.o. female and seen here upon a referral from Dr. Lorelei Pont for a neuro consultation-  11-19-2019;  Chief concern according to patient :  I lost smell and taste after contracting COVID 19 in 2020, Dec 31 st. No hospitalization, just no energy, strength, all 4 family members were infected, sinus headaches. Only Mrs. Rohrig had pneumonia, was SOB.     ICheryl L Davis is a right -handed 3 or [place] American female with a known OSA  sleep disorder. She  has a past medical history of Anxiety, Elevated cholesterol, Essential hypertension (12/25/2019), GERD (gastroesophageal reflux disease), Mass, Panic attacks, Sleep apnea, and Syncope. Her CPAP machine broke about 24 month ago, before the pandemic , but she feels she could sleep better if treated.  The patient had the first sleep study in the year 2010  with a result of OSA- done @ the former [facility].   Sleep relevant medical history: Dr. Virgina Jock has seen her for syncope.     Family medical /sleep history:No other family member on CPAP with OSA, insomnia, sleep walkers.    Social history: Patient is working as Automotive engineer and lives in a household with spouse and 2 children, 19 and 10 at this time.  The patient currently  works full time.  Tobacco use: none  .  ETOH use; social ,  Caffeine intake in form of Coffee( /) Soda( /) Tea ( /) no energy drinks. Regular exercise in form of walking.    Sleep habits are as follows: The patient's dinner time is between 7-8 PM. The patient goes to bed at 11.30 PM and continues to sleep for 5-6 hours.   The preferred sleep position is on her left side , with the support of 1 pillow on an inclined head of bed. . Dreams are reportedly frequent/vivid.  Tv is in the bedroom.  5.45  AM is the usual rise time. The patient wakes up with an alarm.  She reports not feeling refreshed or restored in AM, with symptoms  such as dry mouth , morning headaches, and residual fatigue. Naps are taken ion weekends- falling asleep frequently, lasting from 15-30 minutes and are more refreshing than nocturnal sleep.    Review of Systems: Out of a complete 14 system review, the patient complains of only the following symptoms, and all other reviewed systems are negative.:  Fatigue, sleepiness , snoring, fragmented sleep,    How likely are you to doze in the following situations: 0 = not likely, 1 = slight chance, 2 = moderate chance, 3 = high chance   Sitting and Reading? Watching Television? Sitting inactive in a public place (theater or meeting)? As a passenger in a car for an hour without a break? Lying down in the afternoon when circumstances permit? Sitting and talking to someone? Sitting quietly after lunch without alcohol? In a car, while stopped for a few minutes in traffic?   Total = 11/ 24 points   FSS endorsed at 19/ 63 points.   Social History   Socioeconomic History   Marital status: Married    Spouse name: Not on file   Number of children: 2   Years of education: Not on file   Highest education level: Not on file  Occupational History   Not on file  Tobacco Use   Smoking status: Never   Smokeless tobacco: Never  Vaping Use   Vaping Use: Never used  Substance and Sexual Activity   Alcohol use: No    Comment: rarely   Drug use: No   Sexual activity: Yes    Birth control/protection: Surgical  Other Topics Concern   Not on file  Social History Narrative   Not on file   Social Determinants of Health   Financial Resource Strain: Not on file  Food Insecurity: Not on file  Transportation Needs: Not on file  Physical Activity: Not on file  Stress: Not on file  Social Connections: Not on file    Family History  Problem Relation Age of Onset   Diabetes Mother    Colon polyps Mother        benign   Hypertension Father    Colon cancer Neg Hx    Esophageal cancer Neg Hx      Past Medical History:  Diagnosis Date   Anxiety    Elevated cholesterol    Essential hypertension 12/25/2019   GERD (gastroesophageal reflux disease)    OCCASIONAL   Mass    RIGHT OVARY  --ABDOMINAL PAIN   Panic attacks    Sleep apnea    Syncope    PT STATES HER HEART AND BRAIN "MISFIRE" IF OVERLY HEATED OR UPSET--AND PT WOULD PASS OUT --NO EPISODES SINCE 2000  - NO MEDICATIONS--PT NO LONGER SEES CARDIOLOGIST  Past Surgical History:  Procedure Laterality Date   ABDOMINAL HYSTERECTOMY  07/2011   partical   BIOPSY THYROID     COLONOSCOPY  04/2019   WISDOM TOOTH EXTRACTION       Current Outpatient Medications on File Prior to Visit  Medication Sig Dispense Refill   cholecalciferol (VITAMIN D) 1000 UNITS tablet Take 1,000 Units by mouth daily.     clonazePAM (KLONOPIN) 0.5 MG tablet Take 1/ 2 or 1 twice daily as needed for anxiety 60 tablet 1   co-enzyme Q-10 30 MG capsule Take 200 mg by mouth daily.      EPINEPHrine (EPIPEN 2-PAK) 0.3 mg/0.3 mL IJ SOAJ injection Inject 0.3 mg into the muscle as needed for anaphylaxis. 1 each prn   esomeprazole (NEXIUM) 40 MG capsule Take 40 mg by mouth. ONCE A DAY  IF NEEDED     loratadine (CLARITIN) 10 MG tablet Take 10 mg by mouth daily.     simvastatin (ZOCOR) 20 MG tablet Take 1 tablet (20 mg total) by mouth at bedtime. 90 tablet 3   No current facility-administered medications on file prior to visit.    Allergies  Allergen Reactions   Orange Oil Hives    Patient reports she "can drink OJ without a problem."    Latex Hives    CONTACT WITH LATEX CAUSES HIVES   Omeprazole Other (See Comments)    Abdominal pain   Other     CAN'T EAT ORANGES-BUT CAN DRINK OJ   Phenobarbital     AS ACHILD--THOUGHT TO BE HAVING SEIZURES--HAD ALLERGIC REACTION TO PHENOBARBITAL.   LATER TOLD SHE DID NOT HAVE SEIZURES    Physical exam:  Today's Vitals   10/07/20 1339  Weight: 227 lb 8 oz (103.2 kg)  Height: 5\' 4"  (1.626 m)   Body mass index  is 39.05 kg/m.   Wt Readings from Last 3 Encounters:  10/07/20 227 lb 8 oz (103.2 kg)  07/17/20 224 lb (101.6 kg)  05/13/20 226 lb (102.5 kg)     Ht Readings from Last 3 Encounters:  10/07/20 5\' 4"  (1.626 m)  07/17/20 5\' 4"  (1.626 m)  05/13/20 5\' 4"  (1.626 m)      General: The patient is awake, alert and appears not in acute distress. The patient is well groomed. Head: Normocephalic, atraumatic. Neck is supple. Mallampati; 3 plus,  neck circumference:16 inches . Nasal airflow patent.  Retrognathia is not seen.  Dental status: intact  Cardiovascular:  Regular rate and cardiac rhythm by pulse,  without distended neck veins. Respiratory: Lungs are clear to auscultation.  Skin:  Without evidence of ankle edema, or rash. Trunk: The patient's posture is erect.   Neurologic exam : The patient is awake and alert, oriented to place and time.   Memory subjective described as intact.  Attention span & concentration ability appears normal.  Speech is fluent, without  dysarthria, dysphonia or aphasia.  Mood and affect are appropriate.   Cranial nerves:  loss of smell or taste reported - fully vaccinated  Pupils are equal and briskly reactive to light. Funduscopic exam deferred.  Extraocular movements in vertical and horizontal planes were intact and without nystagmus. No Diplopia. Visual fields by finger perimetry are intact.Facial motor strength is symmetric and tongue and uvula move midline. Neck ROM : rotation, tilt and flexion extension were normal for age and shoulder shrug was symmetrical.  Motor exam:  Symmetric bulk, tone and ROM.   Normal tone -symmetric grip strength . Sensory:  Fine touch,  pinprick and vibration were tested  and  normal.  Proprioception tested in the upper extremities was normal. Coordination: .  The Finger-to-nose maneuver was intact without evidence of ataxia, dysmetria or tremor. Gait and station: Patient could rise unassisted from a seated position, walked  without assistive device.  Stance is of normal width/ base - turned with 3 steps.  Toe and heel walk were deferred.  Deep tendon reflexes: in the upper and lower extremities are symmetric and intact.  Babinski response was deferred.      After spending a total time of 45 minutes face to face and additional time for physical and neurologic examination, review of laboratory studies,  personal review of imaging studies, reports and results of other testing and review of referral information / records as far as provided in visit, I have established the following assessments:  1) POST COVID- Anosmia for floral and spices, not for peppermint and vanilla, coconut. Couldn't smell tobacco smoke last week. Feels as if she is not recovering.   2) diagnosed with OSA and now treated for sleep apnea on CPAP.   3) patient is also sleep restricted - TV on and spending only 6 hours in bed .    My Plan is to proceed with:  1) continue using CPAP- it works. Rv in 2 months.  2) again, use the [employer] school- creating neuro-pathway associations.    I would like to thank  [place], [address], [address],  [place] [id number] for allowing me to meet with and to take care of this pleasant patient.   In short, Margaret Davis is presenting with treated OSA, obesity , and parosmia.  I plan to follow up either personally or through our NP within12 month.   CC: I will share my notes with PCP   Electronically signed by: Larey Seat, MD 10/07/2020 1:42 PM  [facility] Board certified by The [employer] of Sleep Medicine and Diplomate of the [employer] of Sleep Medicine. Board certified In Neurology through the [place], Fellow of the [employer] of Neurology. Medical Director of [facility].

## 2020-10-14 ENCOUNTER — Ambulatory Visit: Admitting: Neurology

## 2020-11-05 ENCOUNTER — Other Ambulatory Visit: Payer: Self-pay | Admitting: Family Medicine

## 2020-11-05 DIAGNOSIS — E782 Mixed hyperlipidemia: Secondary | ICD-10-CM

## 2020-12-02 ENCOUNTER — Encounter: Payer: Self-pay | Admitting: Family Medicine

## 2020-12-03 ENCOUNTER — Telehealth (INDEPENDENT_AMBULATORY_CARE_PROVIDER_SITE_OTHER): Admitting: Registered Nurse

## 2020-12-03 DIAGNOSIS — R6889 Other general symptoms and signs: Secondary | ICD-10-CM

## 2020-12-03 DIAGNOSIS — R0981 Nasal congestion: Secondary | ICD-10-CM | POA: Diagnosis not present

## 2020-12-03 MED ORDER — AZELASTINE HCL 0.1 % NA SOLN: 1.0000 | Freq: Two times a day (BID) | NASAL | 12 refills | Status: DC

## 2020-12-03 MED ORDER — OSELTAMIVIR PHOSPHATE 75 MG PO CAPS: 75.0000 mg | ORAL_CAPSULE | Freq: Two times a day (BID) | ORAL | 0 refills | Status: DC

## 2020-12-03 MED ORDER — DM-GUAIFENESIN ER 30-600 MG PO TB12: 1.0000 | ORAL_TABLET | Freq: Two times a day (BID) | ORAL | 0 refills | Status: DC

## 2020-12-03 NOTE — Patient Instructions (Signed)
[name] -   Margaret Davis to speak with you - sorry that you're not feeling well!  Tylenol 1000mg  three times daily - around every 8 hours or so.  Azelastine - nasal spray - twice daily as needed  Mucinex dm - decongestant, expectorant, cough suppressant - twice daily as needed  Tamiflu - 75mg  twice daily - finish 5 day course even if feeling better.    If breathing starts going downhill, be seen in person at ER or Urgent Care.  Look out for any signs of low oxygen like blue lips, fingertips, etc, shortness of breath at rest, dizziness or cognitive impairment.  Call if you have concerns  Thank you  Rich

## 2020-12-03 NOTE — Progress Notes (Signed)
Telemedicine Encounter- SOAP NOTE Established Patient  This video encounter was conducted with the patient's (or proxy's) verbal consent via audio telecommunications: yes/no: Yes Patient was instructed to have this encounter in a suitably private space; and to only have persons present to whom they give permission to participate. In addition, patient identity was confirmed by use of name plus two identifiers (DOB and address).  I discussed the limitations, risks, security and privacy concerns of performing an evaluation and management service by telephone and the availability of in person appointments. I also discussed with the patient that there may be a patient responsible charge related to this service. The patient expressed understanding and agreed to proceed.  I spent a total of 14 minutes talking with the patient or their proxy.  Patient at home Provider in office  Participants: Kathrin Ruddy, NP and Alveda Reasons  No chief complaint on file.   Subjective   Margaret Davis is a 50 y.o. established patient. Video visit today for congestion  HPI Coughing, sneezing, body aches. Tickle in throat.  Some upset stomach, but no vomiting or diarrhea.  No fevers, 97.4 yesterday  Has had some chills / no sweats but felt feverish at times.  Onset on 11/29 Tuesday was worst symptoms, symptoms generally improving except aches persist.   Works in school - multiple sick contacts. She does mask frequently  No known flu or covid contacts.   Negative for covid at home COVID vacc x 2, booster x 2, flu vaccine in Oct.   Has had some shob. Easily winded with longer conversations and exercise.   Denies chest pain, palpitations, dependent edema, sensory changes (did have COVID in 2021, taste has been diminished since).   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 of smell and taste 12/19/2019   Ageusia 12/19/2019   Anosmia 12/19/2019   Sleep apnea with hypersomnolence 12/19/2019   Olfactory impairment 09/10/2019   History of COVID-19 09/10/2019   Hyperlipidemia 02/14/2019   OSA on CPAP 02/04/2013   Obesity (BMI 30-39.9) 02/04/2013   Syncope    GERD (gastroesophageal reflux disease)    Panic attacks    Routine gynecological examination 06/22/2012    Past Medical History:  Diagnosis Date   Anxiety    Elevated cholesterol    Essential hypertension 12/25/2019   GERD (gastroesophageal reflux disease)    OCCASIONAL   Mass    RIGHT OVARY  --ABDOMINAL PAIN   Panic attacks    Sleep apnea    Syncope    PT STATES HER HEART AND BRAIN "MISFIRE" IF OVERLY HEATED OR UPSET--AND PT WOULD PASS OUT --NO EPISODES SINCE 2000  - NO MEDICATIONS--PT NO LONGER SEES CARDIOLOGIST    Current Outpatient Medications  Medication Sig Dispense Refill   cholecalciferol (VITAMIN D) 1000 UNITS tablet Take 1,000 Units by mouth daily.     clonazePAM (KLONOPIN) 0.5 MG tablet Take 1/ 2 or 1 twice daily as needed for anxiety 60 tablet 1   co-enzyme Q-10 30 MG capsule Take 200 mg by mouth daily.      EPINEPHrine (EPIPEN 2-PAK) 0.3 mg/0.3 mL IJ SOAJ injection Inject 0.3 mg into the muscle as needed for anaphylaxis. 1 each prn   esomeprazole (NEXIUM) 40 MG capsule Take 40 mg by mouth. ONCE A DAY  IF NEEDED     loratadine (CLARITIN) 10 MG tablet Take  10 mg by mouth daily.     simvastatin (ZOCOR) 20 MG tablet TAKE 1 TABLET(20 MG) BY MOUTH AT BEDTIME 90 tablet 3   traZODone (DESYREL) 50 MG tablet Take 0.5 tablets (25 mg total) by mouth at bedtime as needed for sleep. 30 tablet 3   No current facility-administered medications for this visit.    Allergies  Allergen Reactions   Orange Oil Hives    Patient reports she "can drink OJ without a problem."    Latex Hives    CONTACT WITH LATEX CAUSES HIVES   Omeprazole Other (See Comments)    Abdominal pain   Other      CAN'T EAT ORANGES-BUT CAN DRINK OJ   Phenobarbital     AS ACHILD--THOUGHT TO BE HAVING SEIZURES--HAD ALLERGIC REACTION TO PHENOBARBITAL.   LATER TOLD SHE DID NOT HAVE SEIZURES    Social History   Socioeconomic History   Marital status: Married    Spouse name: Not on file   Number of children: 2   Years of education: Not on file   Highest education level: Not on file  Occupational History   Not on file  Tobacco Use   Smoking status: Never   Smokeless tobacco: Never  Vaping Use   Vaping Use: Never used  Substance and Sexual Activity   Alcohol use: No    Comment: rarely   Drug use: No   Sexual activity: Yes    Birth control/protection: Surgical  Other Topics Concern   Not on file  Social History Narrative   Not on file   Social Determinants of Health   Financial Resource Strain: Not on file  Food Insecurity: Not on file  Transportation Needs: Not on file  Physical Activity: Not on file  Stress: Not on file  Social Connections: Not on file  Intimate Partner Violence: Not on file    ROS Per hpi   Objective   Vitals as reported by the patient: There were no vitals filed for this visit.  There are no diagnoses linked to this encounter.  PLAN Tx with tamiflu given flu like symptoms Supportive care with tylenol, mucinex, azelastine Return and ER precautions reviewed with patient who voices understanding Written out of work until Monday. Patient encouraged to call clinic with any questions, comments, or concerns.  I discussed the assessment and treatment plan with the patient. The patient was provided an opportunity to ask questions and all were answered. The patient agreed with the plan and demonstrated an understanding of the instructions.   The patient was advised to call back or seek an in-person evaluation if the symptoms worsen or if the condition fails to improve as anticipated.  I provided 14 minutes of face-to-face time during this encounter.  Maximiano Coss, NP

## 2020-12-04 NOTE — Progress Notes (Addendum)
[facility] [place], [place], [place] [address] [phone]  Date:  12/07/2020   Name:  Margaret Davis   DOB:  1970/08/18   MRN:  [id number]  PCP:  Darreld Mclean, MD    Chief Complaint: Annual Exam (Concerns/ questions: PT stated had flu last week )   History of Present Illness:  Margaret Davis is a 50 y.o. very pleasant female patient who presents with the following:  Pt seen today for a CPE- she was ill with flu last week  She was pretty sick - she was negative for covid, did a virtual visit and was treated with tamflu and nasal spray/ mucinex She is overall better but still feels a bit congested in her chest  Last visit with myself in February  History of hypertension, OSA on CPAP, exertional dyspnea  Pap- hysterectomy  Covid booster- done  Flu Mammo UTD Colon 2021 She is not fasting today for labs  She is using her CPAP and this is working well for her  She continues to have loss of taste and smell from her covid 53 a year ago; some of her smell sense is coming back which is great news  She also notes a shooting, tingling pain down the ulnar aspect of her left forearm and hand.  This has been present for about 6 weeks She is currently wearing a walking boot on the right for right foot fracture, being treated by [facility]  Simvastatin trazodone  Patient Active Problem List   Diagnosis Date Noted   Essential hypertension 12/25/2019   Dyspnea on exertion 12/25/2019   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 12/19/2019   Ageusia 12/19/2019   Anosmia 12/19/2019   Sleep apnea with hypersomnolence 12/19/2019   Olfactory impairment 09/10/2019   History of COVID-19 09/10/2019   Hyperlipidemia 02/14/2019   OSA on CPAP 02/04/2013   Obesity (BMI 30-39.9) 02/04/2013   Syncope    GERD (gastroesophageal reflux disease)    Panic attacks    Routine gynecological  examination 06/22/2012    Past Medical History:  Diagnosis Date   Anxiety    Elevated cholesterol    Essential hypertension 12/25/2019   GERD (gastroesophageal reflux disease)    OCCASIONAL   Mass    RIGHT OVARY  --ABDOMINAL PAIN   Panic attacks    Sleep apnea    Syncope    PT STATES HER HEART AND BRAIN "MISFIRE" IF OVERLY HEATED OR UPSET--AND PT WOULD PASS OUT --NO EPISODES SINCE 2000  - NO MEDICATIONS--PT NO LONGER SEES CARDIOLOGIST    Past Surgical History:  Procedure Laterality Date   ABDOMINAL HYSTERECTOMY  07/2011   partical   BIOPSY THYROID     COLONOSCOPY  04/2019   WISDOM TOOTH EXTRACTION      Social History   Tobacco Use   Smoking status: Never   Smokeless tobacco: Never  Vaping Use   Vaping Use: Never used  Substance Use Topics   Alcohol use: No    Comment: rarely   Drug use: No    Family History  Problem Relation Age of Onset   Diabetes Mother    Colon polyps Mother        benign   Hypertension Father    Colon cancer Neg Hx    Esophageal cancer Neg Hx     Allergies  Allergen Reactions   Orange Oil Hives  Patient reports she "can drink OJ without a problem."    Latex Hives    CONTACT WITH LATEX CAUSES HIVES   Omeprazole Other (See Comments)    Abdominal pain   Other     CAN'T EAT ORANGES-BUT CAN DRINK OJ   Phenobarbital     AS ACHILD--THOUGHT TO BE HAVING SEIZURES--HAD ALLERGIC REACTION TO PHENOBARBITAL.   LATER TOLD SHE DID NOT HAVE SEIZURES    Medication list has been reviewed and updated.  Current Outpatient Medications on File Prior to Visit  Medication Sig Dispense Refill   azelastine (ASTELIN) 0.1 % nasal spray Place 1 spray into both nostrils 2 (two) times daily. Use in each nostril as directed 30 mL 12   cholecalciferol (VITAMIN D) 1000 UNITS tablet Take 1,000 Units by mouth daily.     co-enzyme Q-10 30 MG capsule Take 200 mg by mouth daily.      dextromethorphan-guaiFENesin (MUCINEX DM) 30-600 MG 12hr tablet Take 1 tablet  by mouth 2 (two) times daily. 20 tablet 0   EPINEPHrine (EPIPEN 2-PAK) 0.3 mg/0.3 mL IJ SOAJ injection Inject 0.3 mg into the muscle as needed for anaphylaxis. 1 each prn   esomeprazole (NEXIUM) 40 MG capsule Take 40 mg by mouth. ONCE A DAY  IF NEEDED     loratadine (CLARITIN) 10 MG tablet Take 10 mg by mouth daily.     oseltamivir (TAMIFLU) 75 MG capsule Take 1 capsule (75 mg total) by mouth 2 (two) times daily. 10 capsule 0   simvastatin (ZOCOR) 20 MG tablet TAKE 1 TABLET(20 MG) BY MOUTH AT BEDTIME 90 tablet 3   traZODone (DESYREL) 50 MG tablet Take 0.5 tablets (25 mg total) by mouth at bedtime as needed for sleep. 30 tablet 3   No current facility-administered medications on file prior to visit.    Review of Systems:  As per HPI- otherwise negative.   Physical Examination: Vitals:   12/07/20 1443  BP: 138/80  Pulse: 94  Resp: 16  Temp: 98 F (36.7 C)  SpO2: 98%   Vitals:   12/07/20 1443  Weight: 230 lb 12.8 oz (104.7 kg)  Height: 5\' 4"  (1.626 m)   Body mass index is 39.62 kg/m. Ideal Body Weight: Weight in (lb) to have BMI = 25: 145.3  GEN: no acute distress.  Obese, otherwise looks well HEENT: Atraumatic, Normocephalic.  Bilateral TM wnl, oropharynx normal.  PEERL,EOMI.   Ears and Nose: No external deformity. CV: RRR, No M/G/R. No JVD. No thrill. No extra heart sounds. PULM: CTA B, no wheezes, crackles, rhonchi. No retractions. No resp. distress. No accessory muscle use. ABD: S, NT, ND, +BS. No rebound. No HSM. EXTR: No c/c/e PSYCH: Normally interactive. Conversant.  Patient has tenderness of the ulnar aspect of her left wrist, no evidence of carpal tunnel syndrome at this time.  No swelling or redness  Assessment and Plan: Physical exam  Mixed hyperlipidemia - Plan: Lipid panel  Screening, deficiency anemia, iron - Plan: CBC  Screening for thyroid disorder - Plan: TSH  Screening for diabetes mellitus - Plan: Comprehensive metabolic panel, Hemoglobin  A1c  Fatigue, unspecified type - Plan: TSH, VITAMIN D 25 Hydroxy (Vit-D Deficiency, Fractures)  Left wrist pain - Plan: Ambulatory referral to Hand Surgery  Panic attacks - Plan: clonazePAM (KLONOPIN) 0.5 MG tablet  Physical exam today -encouraged healthy diet and exercise routine Will plan further follow- up pending labs.  Refill temazepam which she uses as needed for panic attacks Referral to see Dr. Grandville Silos  ago for orthopedics to discuss left wrist pain, suspect she may have ulnar nerve impingement.  She had a nuclear cardiac stress test recently Signed Lamar Blinks, MD   Received her labs as follows 12/6- message to pt  Results for orders placed or performed in visit on 12/07/20  CBC  Result Value Ref Range   WBC 6.3 4.0 - 10.5 K/uL   RBC 4.35 3.87 - 5.11 Mil/uL   Platelets 324.0 150.0 - 400.0 K/uL   Hemoglobin 13.3 12.0 - 15.0 g/dL   HCT 40.0 36.0 - 46.0 %   MCV 92.1 78.0 - 100.0 fl   MCHC 33.1 30.0 - 36.0 g/dL   RDW 13.3 11.5 - 15.5 %  Comprehensive metabolic panel  Result Value Ref Range   Sodium 138 135 - 145 mEq/L   Potassium 3.8 3.5 - 5.1 mEq/L   Chloride 103 96 - 112 mEq/L   CO2 27 19 - 32 mEq/L   Glucose, Bld 84 70 - 99 mg/dL   BUN 10 6 - 23 mg/dL   Creatinine, Ser 0.81 0.40 - 1.20 mg/dL   Total Bilirubin 0.3 0.2 - 1.2 mg/dL   Alkaline Phosphatase 72 39 - 117 U/L   AST 16 0 - 37 U/L   ALT 14 0 - 35 U/L   Total Protein 7.2 6.0 - 8.3 g/dL   Albumin 4.2 3.5 - 5.2 g/dL   GFR 85.31 >60.00 mL/min   Calcium 9.7 8.4 - 10.5 mg/dL  Hemoglobin A1c  Result Value Ref Range   Hgb A1c MFr Bld 5.8 4.6 - 6.5 %  Lipid panel  Result Value Ref Range   Cholesterol 218 (H) 0 - 200 mg/dL   Triglycerides 149.0 0.0 - 149.0 mg/dL   HDL 75.70 >39.00 mg/dL   VLDL 29.8 0.0 - 40.0 mg/dL   LDL Cholesterol 112 (H) 0 - 99 mg/dL   Total CHOL/HDL Ratio 3    NonHDL 142.20   TSH  Result Value Ref Range   TSH 2.12 0.35 - 5.50 uIU/mL  VITAMIN D 25 Hydroxy (Vit-D Deficiency,  Fractures)  Result Value Ref Range   VITD 24.51 (L) 30.00 - 100.00 ng/mL

## 2020-12-04 NOTE — Patient Instructions (Addendum)
Good to see you again today- I will be in touch with your labs asap  We will have you see Dr Grandville Silos with [facility] (hand surgery) about your wrist

## 2020-12-07 ENCOUNTER — Ambulatory Visit (INDEPENDENT_AMBULATORY_CARE_PROVIDER_SITE_OTHER): Admitting: Family Medicine

## 2020-12-07 VITALS — BP 138/80 | HR 94 | Temp 98.0°F | Resp 16 | Ht 64.0 in | Wt 230.8 lb

## 2020-12-07 DIAGNOSIS — M25532 Pain in left wrist: Secondary | ICD-10-CM

## 2020-12-07 DIAGNOSIS — R5383 Other fatigue: Secondary | ICD-10-CM | POA: Diagnosis not present

## 2020-12-07 DIAGNOSIS — Z131 Encounter for screening for diabetes mellitus: Secondary | ICD-10-CM | POA: Diagnosis not present

## 2020-12-07 DIAGNOSIS — E782 Mixed hyperlipidemia: Secondary | ICD-10-CM

## 2020-12-07 DIAGNOSIS — Z130 Encounter for screening for diseases of the blood and blood-forming organs and certain disorders involving the immune mechanism: Secondary | ICD-10-CM

## 2020-12-07 DIAGNOSIS — Z0000 Encounter for general adult medical examination without abnormal findings: Secondary | ICD-10-CM | POA: Diagnosis not present

## 2020-12-07 DIAGNOSIS — Z1329 Encounter for screening for other suspected endocrine disorder: Secondary | ICD-10-CM | POA: Diagnosis not present

## 2020-12-07 DIAGNOSIS — F410 Panic disorder [episodic paroxysmal anxiety] without agoraphobia: Secondary | ICD-10-CM

## 2020-12-07 DIAGNOSIS — R7303 Prediabetes: Secondary | ICD-10-CM

## 2020-12-07 MED ORDER — CLONAZEPAM 0.5 MG PO TABS: ORAL_TABLET | ORAL | 1 refills | Status: DC

## 2020-12-08 ENCOUNTER — Encounter: Payer: Self-pay | Admitting: Family Medicine

## 2020-12-08 DIAGNOSIS — R7303 Prediabetes: Secondary | ICD-10-CM | POA: Insufficient documentation

## 2020-12-08 LAB — LIPID PANEL
Cholesterol: 218 mg/dL — ABNORMAL HIGH (ref 0–200)
HDL: 75.7 mg/dL (ref >39.00)
LDL Cholesterol: 112 mg/dL — ABNORMAL HIGH (ref 0–99)
NonHDL: 142.2
Total CHOL/HDL Ratio: 3
Triglycerides: 149 mg/dL (ref 0.0–149.0)
VLDL: 29.8 mg/dL (ref 0.0–40.0)

## 2020-12-08 LAB — COMPREHENSIVE METABOLIC PANEL
ALT: 14 U/L (ref 0–35)
AST: 16 U/L (ref 0–37)
Albumin: 4.2 g/dL (ref 3.5–5.2)
Alkaline Phosphatase: 72 U/L (ref 39–117)
BUN: 10 mg/dL (ref 6–23)
CO2: 27 mEq/L (ref 19–32)
Calcium: 9.7 mg/dL (ref 8.4–10.5)
Chloride: 103 mEq/L (ref 96–112)
Creatinine, Ser: 0.81 mg/dL (ref 0.40–1.20)
GFR: 85.31 mL/min (ref >60.00)
Glucose, Bld: 84 mg/dL (ref 70–99)
Potassium: 3.8 mEq/L (ref 3.5–5.1)
Sodium: 138 mEq/L (ref 135–145)
Total Bilirubin: 0.3 mg/dL (ref 0.2–1.2)
Total Protein: 7.2 g/dL (ref 6.0–8.3)

## 2020-12-08 LAB — CBC
HCT: 40 % (ref 36.0–46.0)
Hemoglobin: 13.3 g/dL (ref 12.0–15.0)
MCHC: 33.1 g/dL (ref 30.0–36.0)
MCV: 92.1 fl (ref 78.0–100.0)
Platelets: 324 10*3/uL (ref 150.0–400.0)
RBC: 4.35 Mil/uL (ref 3.87–5.11)
RDW: 13.3 % (ref 11.5–15.5)
WBC: 6.3 10*3/uL (ref 4.0–10.5)

## 2020-12-08 LAB — TSH: TSH: 2.12 u[IU]/mL (ref 0.35–5.50)

## 2020-12-08 LAB — VITAMIN D 25 HYDROXY (VIT D DEFICIENCY, FRACTURES): VITD: 24.51 ng/mL — ABNORMAL LOW (ref 30.00–100.00)

## 2020-12-08 LAB — HEMOGLOBIN A1C: Hgb A1c MFr Bld: 5.8 % (ref 4.6–6.5)

## 2021-03-09 NOTE — Progress Notes (Signed)
[facility] ?[address], [address] ?[place], [place] [address] ?336 [phone] ?Fax [phone] ? ?Date:  03/11/2021  ? ?Name:  GENEEN DIETER   DOB:  01/17/1970   MRN:  [id number] ? ?PCP:  Darreld Mclean, MD  ? ? ?Chief Complaint: URI (Pt c/o nasal and facial congestion, nasal drainage, chest congestion for over a week. She has a sore throat. Works at a high school. No covid tests.she has tried some nasal spray and Sudafed. ) ? ? ?History of Present Illness: ? ?COURTNIE BRENES is a 51 y.o. very pleasant female patient who presents with the following: ? ?Patient seen today with concern of illness ?History of hypertension, sleep apnea on CPAP, GERD, hyperlipidemia ?Most recent visit with myself was in December for a physical ? ?She notes left sided sinus congestion and some discomfort with swallowing ?Sudafed and nasal spray are not helping  ?She has had sx for about a week now ?Cough can be severe - she thinks due to PND ?Worse when she has to cough a lot  ?Cough drops do help ?No fever- she does not think this is the flu ?Over the weekend she had some diarrhea- resolved now  ? ?She last had covid about 2 years ago -she still has some taste and smell changes  ?She did not do a covid test this time  ?The rest of her family is not sick ?Patient Active Problem List  ? Diagnosis Date Noted  ? Prediabetes 12/08/2020  ? Essential hypertension 12/25/2019  ? Dyspnea on exertion 12/25/2019  ? 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 12/19/2019  ? Ageusia 12/19/2019  ? Anosmia 12/19/2019  ? Olfactory impairment 09/10/2019  ? History of COVID-19 09/10/2019  ? Hyperlipidemia 02/14/2019  ? OSA on CPAP 02/04/2013  ? Obesity (BMI 30-39.9) 02/04/2013  ? Syncope   ? GERD (gastroesophageal reflux disease)   ? Panic attacks   ? ? ?Past Medical History:  ?Diagnosis Date  ? Anxiety   ? Elevated cholesterol   ? Essential hypertension 12/25/2019  ? GERD (gastroesophageal  reflux disease)   ? OCCASIONAL  ? Mass   ? RIGHT OVARY  --ABDOMINAL PAIN  ? Panic attacks   ? Sleep apnea   ? Syncope   ? PT STATES HER HEART AND BRAIN "MISFIRE" IF OVERLY HEATED OR UPSET--AND PT WOULD PASS OUT --NO EPISODES SINCE 2000  - NO MEDICATIONS--PT NO LONGER SEES CARDIOLOGIST  ? ? ?Past Surgical History:  ?Procedure Laterality Date  ? ABDOMINAL HYSTERECTOMY  07/2011  ? partical  ? BIOPSY THYROID    ? COLONOSCOPY  04/2019  ? WISDOM TOOTH EXTRACTION    ? ? ?Social History  ? ?Tobacco Use  ? Smoking status: Never  ? Smokeless tobacco: Never  ?Vaping Use  ? Vaping Use: Never used  ?Substance Use Topics  ? Alcohol use: No  ?  Comment: rarely  ? Drug use: No  ? ? ?Family History  ?Problem Relation Age of Onset  ? Diabetes Mother   ? Colon polyps Mother   ?     benign  ? Hypertension Father   ? Colon cancer Neg Hx   ? Esophageal cancer Neg Hx   ? ? ?Allergies  ?Allergen Reactions  ? Orange Oil Hives  ?  Patient reports she "can drink OJ without a problem." ?  ? Latex Hives  ?  CONTACT WITH LATEX CAUSES HIVES  ? Omeprazole Other (  See Comments)  ?  Abdominal pain  ? Other   ?  CAN'T EAT ORANGES-BUT CAN DRINK OJ  ? Phenobarbital   ?  AS ACHILD--THOUGHT TO BE HAVING SEIZURES--HAD ALLERGIC REACTION TO PHENOBARBITAL.   LATER TOLD SHE DID NOT HAVE SEIZURES  ? ? ?Medication list has been reviewed and updated. ? ?Current Outpatient Medications on File Prior to Visit  ?Medication Sig Dispense Refill  ? azelastine (ASTELIN) 0.1 % nasal spray Place 1 spray into both nostrils 2 (two) times daily. Use in each nostril as directed 30 mL 12  ? cholecalciferol (VITAMIN D) 1000 UNITS tablet Take 1,000 Units by mouth daily.    ? clonazePAM (KLONOPIN) 0.5 MG tablet Take 1/ 2 or 1 twice daily as needed for anxiety 60 tablet 1  ? co-enzyme Q-10 30 MG capsule Take 200 mg by mouth daily.     ? EPINEPHrine (EPIPEN 2-PAK) 0.3 mg/0.3 mL IJ SOAJ injection Inject 0.3 mg into the muscle as needed for anaphylaxis. 1 each prn  ? esomeprazole  (NEXIUM) 40 MG capsule Take 40 mg by mouth. ONCE A DAY  IF NEEDED    ? loratadine (CLARITIN) 10 MG tablet Take 10 mg by mouth daily.    ? oseltamivir (TAMIFLU) 75 MG capsule Take 1 capsule (75 mg total) by mouth 2 (two) times daily. 10 capsule 0  ? simvastatin (ZOCOR) 20 MG tablet TAKE 1 TABLET(20 MG) BY MOUTH AT BEDTIME 90 tablet 3  ? traZODone (DESYREL) 50 MG tablet Take 0.5 tablets (25 mg total) by mouth at bedtime as needed for sleep. 30 tablet 3  ? ?No current facility-administered medications on file prior to visit.  ? ? ?Review of Systems: ? ?As per HPI- otherwise negative. ?BP Readings from Last 3 Encounters:  ?03/11/21 (!) 146/88  ?12/07/20 138/80  ?10/07/20 136/79  ? ? ? ?Physical Examination: ?Vitals:  ? 03/11/21 1341  ?BP: (!) 146/88  ?Pulse: 67  ?Resp: 18  ?Temp: 97.9 ?F (36.6 ?C)  ?SpO2: 97%  ? ?Vitals:  ? 03/11/21 1341  ?Weight: 227 lb 6.4 oz (103.1 kg)  ?Height: '5\' 4"'$  (1.626 m)  ? ?Body mass index is 39.03 kg/m?. ?Ideal Body Weight: Weight in (lb) to have BMI = 25: 145.3 ? ?GEN: no acute distress.  Obese, looks well ?HEENT: Atraumatic, Normocephalic.  Bilateral TM wnl, oropharynx normal.  PEERL,EOMI. left nasal cavity is inflamed and swollen ?Ears and Nose: No external deformity. ?CV: RRR, No M/G/R. No JVD. No thrill. No extra heart sounds. ?PULM: CTA B, no wheezes, crackles, rhonchi. No retractions. No resp. distress. No accessory muscle use. ?EXTR: No c/c/e ?PSYCH: Normally interactive. Conversant.  ? ?Results for orders placed or performed in visit on 03/11/21  ?POC COVID-19 BinaxNow  ?Result Value Ref Range  ? SARS Coronavirus 2 Ag Negative Negative  ? ?BP Readings from Last 3 Encounters:  ?03/11/21 140/90  ?12/07/20 138/80  ?10/07/20 136/79  ? ? ?Assessment and Plan: ?Acute non-recurrent sinusitis, unspecified location - Plan: amoxicillin (AMOXIL) 500 MG capsule, benzonatate (TESSALON) 200 MG capsule, POC COVID-19 BinaxNow ?Patient seen today with symptoms of likely sinus infection.  Rapid  COVID-19 is negative ?We will treat with amoxicillin, Tessalon Perles to use as needed for cough ?Blood pressure is elevated today, she has been taking Sudafed.  She will monitor this at home and let me know if it continues to run too high ? ?Signed ?Lamar Blinks, MD ? ?

## 2021-03-11 ENCOUNTER — Encounter: Payer: Self-pay | Admitting: Family Medicine

## 2021-03-11 ENCOUNTER — Ambulatory Visit (INDEPENDENT_AMBULATORY_CARE_PROVIDER_SITE_OTHER): Admitting: Family Medicine

## 2021-03-11 VITALS — BP 140/90 | HR 67 | Temp 97.9°F | Resp 18 | Ht 64.0 in | Wt 227.4 lb

## 2021-03-11 DIAGNOSIS — J019 Acute sinusitis, unspecified: Secondary | ICD-10-CM | POA: Diagnosis not present

## 2021-03-11 LAB — POC COVID19 BINAXNOW: SARS Coronavirus 2 Ag: NEGATIVE

## 2021-03-11 MED ORDER — BENZONATATE 200 MG PO CAPS: 200.0000 mg | ORAL_CAPSULE | Freq: Three times a day (TID) | ORAL | 0 refills | Status: DC | PRN

## 2021-03-11 MED ORDER — AMOXICILLIN 500 MG PO CAPS: 1000.0000 mg | ORAL_CAPSULE | Freq: Two times a day (BID) | ORAL | 0 refills | Status: DC

## 2021-03-11 NOTE — Patient Instructions (Addendum)
It was good to see you today but I am sorry you are not feeling well  ?Please use the antibiotic for a sinus infection- let me know if you are not feeling better soon!  ?

## 2021-03-19 ENCOUNTER — Encounter: Payer: Self-pay | Admitting: Family Medicine

## 2021-03-19 DIAGNOSIS — R0981 Nasal congestion: Secondary | ICD-10-CM

## 2021-03-19 MED ORDER — PREDNISONE 20 MG PO TABS: ORAL_TABLET | ORAL | 0 refills | Status: DC

## 2021-03-25 ENCOUNTER — Encounter: Payer: Self-pay | Admitting: Family Medicine

## 2021-03-25 DIAGNOSIS — R052 Subacute cough: Secondary | ICD-10-CM

## 2021-03-25 MED ORDER — ASMANEX HFA 100 MCG/ACT IN AERO: 2.0000 | INHALATION_SPRAY | Freq: Two times a day (BID) | RESPIRATORY_TRACT | 1 refills | Status: DC

## 2021-03-25 MED ORDER — MONTELUKAST SODIUM 10 MG PO TABS: 10.0000 mg | ORAL_TABLET | Freq: Every day | ORAL | 3 refills | Status: DC

## 2021-03-27 NOTE — Progress Notes (Signed)
[facility] ?[address], [address] ?[place], [place] [address] ?336 [phone] ?Fax [phone] ? ?Date:  03/29/2021  ? ?Name:  Margaret Davis   DOB:  October 04, 1970   MRN:  [id number] ? ?PCP:  Darreld Mclean, MD  ? ? ?Chief Complaint: Asthma (Pt says she is starting to feel like she is starting to wheeze. Still has some congestion.) ? ? ?History of Present Illness: ? ?Margaret Davis is a 51 y.o. very pleasant female patient who presents with the following: ? ?Patient seen today with concern of illness- History of hypertension, OSA on CPAP, exertional dyspnea ? ?We had a physical together in December ?Seen on March 9 with concern of sinus congestion ?She tested negative for COVID, treated with amoxicillin ?She contacted me on March 17 with concern of continued symptoms-we added prednisone ?Called back on March 23 with concern of continued wheezing and hoarse voice. ?Added Singulair and steroid inhaler, scheduled visit for today- she just got the singulair but the inhaler was not dispensed for some reason.  We are not sure if her insurance did not cover it or what may have happened.  She will investigate with her pharmacy ? ?She had COVID a couple of years ago and seemed to recover fully although she is starting to wonder if this could be "long COVID" ? ?She thinks the singulair helped a bit ?She feels like she has post nasal drainage which makes her throat feel congested ?She still has a runny nose ?Cough is better-only coughing "here and there"  ?No fever ?Wheezing will occur with exertion, less so with change in temperature ?Patient Active Problem List  ? Diagnosis Date Noted  ? Prediabetes 12/08/2020  ? Essential hypertension 12/25/2019  ? Dyspnea on exertion 12/25/2019  ? 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 12/19/2019  ? Ageusia 12/19/2019  ? Anosmia 12/19/2019  ? Olfactory impairment 09/10/2019  ? History of COVID-19 09/10/2019  ?  Hyperlipidemia 02/14/2019  ? OSA on CPAP 02/04/2013  ? Obesity (BMI 30-39.9) 02/04/2013  ? Syncope   ? GERD (gastroesophageal reflux disease)   ? Panic attacks   ? ? ?Past Medical History:  ?Diagnosis Date  ? Anxiety   ? Elevated cholesterol   ? Essential hypertension 12/25/2019  ? GERD (gastroesophageal reflux disease)   ? OCCASIONAL  ? Mass   ? RIGHT OVARY  --ABDOMINAL PAIN  ? Panic attacks   ? Sleep apnea   ? Syncope   ? PT STATES HER HEART AND BRAIN "MISFIRE" IF OVERLY HEATED OR UPSET--AND PT WOULD PASS OUT --NO EPISODES SINCE 2000  - NO MEDICATIONS--PT NO LONGER SEES CARDIOLOGIST  ? ? ?Past Surgical History:  ?Procedure Laterality Date  ? ABDOMINAL HYSTERECTOMY  07/2011  ? partical  ? BIOPSY THYROID    ? COLONOSCOPY  04/2019  ? WISDOM TOOTH EXTRACTION    ? ? ?Social History  ? ?Tobacco Use  ? Smoking status: Never  ? Smokeless tobacco: Never  ?Vaping Use  ? Vaping Use: Never used  ?Substance Use Topics  ? Alcohol use: No  ?  Comment: rarely  ? Drug use: No  ? ? ?Family History  ?Problem Relation Age of Onset  ? Diabetes Mother   ? Colon polyps Mother   ?     benign  ? Hypertension Father   ? Colon cancer Neg Hx   ? Esophageal cancer Neg Hx   ? ? ?Allergies  ?Allergen  Reactions  ? Orange Oil Hives  ?  Patient reports she "can drink OJ without a problem." ?  ? Latex Hives  ?  CONTACT WITH LATEX CAUSES HIVES  ? Omeprazole Other (See Comments)  ?  Abdominal pain  ? Other   ?  CAN'T EAT ORANGES-BUT CAN DRINK OJ  ? Phenobarbital   ?  AS ACHILD--THOUGHT TO BE HAVING SEIZURES--HAD ALLERGIC REACTION TO PHENOBARBITAL.   LATER TOLD SHE DID NOT HAVE SEIZURES  ? ? ?Medication list has been reviewed and updated. ? ?Current Outpatient Medications on File Prior to Visit  ?Medication Sig Dispense Refill  ? azelastine (ASTELIN) 0.1 % nasal spray Place 1 spray into both nostrils 2 (two) times daily. Use in each nostril as directed 30 mL 12  ? cholecalciferol (VITAMIN D) 1000 UNITS tablet Take 1,000 Units by mouth daily.    ?  clonazePAM (KLONOPIN) 0.5 MG tablet Take 1/ 2 or 1 twice daily as needed for anxiety 60 tablet 1  ? co-enzyme Q-10 30 MG capsule Take 200 mg by mouth daily.     ? EPINEPHrine (EPIPEN 2-PAK) 0.3 mg/0.3 mL IJ SOAJ injection Inject 0.3 mg into the muscle as needed for anaphylaxis. 1 each prn  ? esomeprazole (NEXIUM) 40 MG capsule Take 40 mg by mouth. ONCE A DAY  IF NEEDED    ? loratadine (CLARITIN) 10 MG tablet Take 10 mg by mouth daily.    ? Mometasone Furoate (ASMANEX HFA) 100 MCG/ACT AERO Inhale 2 puffs into the lungs in the morning and at bedtime. 13 g 1  ? montelukast (SINGULAIR) 10 MG tablet Take 1 tablet (10 mg total) by mouth at bedtime. 30 tablet 3  ? predniSONE (DELTASONE) 20 MG tablet Take 40 mg daily for 3 days, then 20 mg daily for 3 days 9 tablet 0  ? simvastatin (ZOCOR) 20 MG tablet TAKE 1 TABLET(20 MG) BY MOUTH AT BEDTIME 90 tablet 3  ? traZODone (DESYREL) 50 MG tablet Take 0.5 tablets (25 mg total) by mouth at bedtime as needed for sleep. 30 tablet 3  ? ?No current facility-administered medications on file prior to visit.  ? ? ?Review of Systems: ? ?As per HPI- otherwise negative. ? ? ?Physical Examination: ?Vitals:  ? 03/29/21 1002  ?BP: 122/82  ?Pulse: 80  ?Resp: 18  ?Temp: 98.3 ?F (36.8 ?C)  ?SpO2: 98%  ? ?Vitals:  ? 03/29/21 1002  ?Weight: 229 lb 6.4 oz (104.1 kg)  ?Height: '5\' 4"'$  (1.626 m)  ? ?Body mass index is 39.38 kg/m?. ?Ideal Body Weight: Weight in (lb) to have BMI = 25: 145.3 ? ?GEN: no acute distress.  Obese, looks well ?HEENT: Atraumatic, Normocephalic.  Bilateral TM wnl, oropharynx normal.  PEERL,EOMI.   ?Ears and Nose: No external deformity. ?CV: RRR, No M/G/R. No JVD. No thrill. No extra heart sounds. ?PULM: CTA B, no wheezes, crackles, rhonchi. No retractions. No resp. distress. No accessory muscle use. ?ABD: S, NT, ND, +BS. No rebound. No HSM. ?EXTR: No c/c/e ?PSYCH: Normally interactive. Conversant.  ? ? ?Assessment and Plan: ?PND (post-nasal drip) - Plan: ipratropium (ATROVENT) 0.03  % nasal spray, POC COVID-19 BinaxNow ? ?Subacute cough - Plan: POC COVID-19 BinaxNow ? ?Wheezing - Plan: DG Chest 2 View, POC COVID-19 BinaxNow, D-Dimer, Quantitative, Basic metabolic panel ? ?Patient seen today with concern of cough, wheezing and postnasal drainage ?She has had symptoms for about 2-1/2 weeks, treated with antibiotics and oral steroids.  We also added Singulair, she was not able to pick  up her inhaled steroid yet.  She will investigate the inhaled steroid situation, we will add Atrovent nasal.  No history of glaucoma ?Rapid COVID test negative ?We discussed the possibility of pulmonary embolism, will obtain a D-dimer. ?She will seek care if getting worse ? ?Signed ?Lamar Blinks, MD ? ?Received labs as below, message to patient ?She plans to get her chest x-ray tomorrow ?Results for orders placed or performed in visit on 03/29/21  ?D-Dimer, Quantitative  ?Result Value Ref Range  ? D-Dimer, Quant 0.28 <0.50 mcg/mL FEU  ?Basic metabolic panel  ?Result Value Ref Range  ? Sodium 139 135 - 145 mEq/L  ? Potassium 3.9 3.5 - 5.1 mEq/L  ? Chloride 102 96 - 112 mEq/L  ? CO2 30 19 - 32 mEq/L  ? Glucose, Bld 112 (H) 70 - 99 mg/dL  ? BUN 11 6 - 23 mg/dL  ? Creatinine, Ser 0.86 0.40 - 1.20 mg/dL  ? GFR 79.22 >60.00 mL/min  ? Calcium 9.7 8.4 - 10.5 mg/dL  ?POC COVID-19 BinaxNow  ?Result Value Ref Range  ? SARS Coronavirus 2 Ag Negative Negative  ? ? ?

## 2021-03-29 ENCOUNTER — Encounter: Payer: Self-pay | Admitting: Family Medicine

## 2021-03-29 ENCOUNTER — Ambulatory Visit (INDEPENDENT_AMBULATORY_CARE_PROVIDER_SITE_OTHER): Admitting: Family Medicine

## 2021-03-29 VITALS — BP 122/82 | HR 80 | Temp 98.3°F | Resp 18 | Ht 64.0 in | Wt 229.4 lb

## 2021-03-29 DIAGNOSIS — R062 Wheezing: Secondary | ICD-10-CM

## 2021-03-29 DIAGNOSIS — R052 Subacute cough: Secondary | ICD-10-CM

## 2021-03-29 DIAGNOSIS — R0982 Postnasal drip: Secondary | ICD-10-CM | POA: Diagnosis not present

## 2021-03-29 LAB — D-DIMER, QUANTITATIVE: D-Dimer, Quant: 0.28 mcg/mL FEU (ref <0.50)

## 2021-03-29 LAB — BASIC METABOLIC PANEL
BUN: 11 mg/dL (ref 6–23)
CO2: 30 mEq/L (ref 19–32)
Calcium: 9.7 mg/dL (ref 8.4–10.5)
Chloride: 102 mEq/L (ref 96–112)
Creatinine, Ser: 0.86 mg/dL (ref 0.40–1.20)
GFR: 79.22 mL/min (ref >60.00)
Glucose, Bld: 112 mg/dL — ABNORMAL HIGH (ref 70–99)
Potassium: 3.9 mEq/L (ref 3.5–5.1)
Sodium: 139 mEq/L (ref 135–145)

## 2021-03-29 LAB — POC COVID19 BINAXNOW: SARS Coronavirus 2 Ag: NEGATIVE

## 2021-03-29 MED ORDER — IPRATROPIUM BROMIDE 0.03 % NA SOLN: 2.0000 | Freq: Three times a day (TID) | NASAL | 12 refills | Status: DC

## 2021-03-29 NOTE — Patient Instructions (Addendum)
It was good to see you again today- we will add a nasal spray to dry out post nasal drainage ?Chest film is ordered for you to do at your convenience ?Let me know if you can't pick up the inhaled steroid  ?We will also do a D dimer to rule out the possibility of a pulmonary embolus  ?

## 2021-03-30 ENCOUNTER — Other Ambulatory Visit: Payer: Self-pay

## 2021-03-30 ENCOUNTER — Telehealth: Payer: Self-pay

## 2021-03-30 MED ORDER — FLOVENT HFA 44 MCG/ACT IN AERO: 2.0000 | INHALATION_SPRAY | Freq: Two times a day (BID) | RESPIRATORY_TRACT | 12 refills | Status: DC

## 2021-03-30 MED ORDER — FLUTICASONE PROPIONATE HFA 44 MCG/ACT IN AERO: 2.0000 | INHALATION_SPRAY | Freq: Two times a day (BID) | RESPIRATORY_TRACT | 12 refills | Status: DC

## 2021-03-30 NOTE — Addendum Note (Signed)
Addended by: Darreld Mclean on: 03/30/2021 06:44 AM ? ? Modules accepted: Orders ? ?

## 2021-03-30 NOTE — Telephone Encounter (Signed)
Was starting a PA for Asmanex until I realize Flovent was sent in its place.Not needed.  ?

## 2021-06-01 ENCOUNTER — Encounter: Payer: Self-pay | Admitting: Family Medicine

## 2021-06-02 ENCOUNTER — Encounter: Payer: Self-pay | Admitting: Family Medicine

## 2021-06-02 ENCOUNTER — Ambulatory Visit (INDEPENDENT_AMBULATORY_CARE_PROVIDER_SITE_OTHER): Admitting: Family Medicine

## 2021-06-02 ENCOUNTER — Ambulatory Visit (HOSPITAL_BASED_OUTPATIENT_CLINIC_OR_DEPARTMENT_OTHER)
Admission: RE | Admit: 2021-06-02 | Discharge: 2021-06-02 | Disposition: A | Discharge status: Home / Self Care | Source: Ambulatory Visit | Attending: Family Medicine | Admitting: Family Medicine

## 2021-06-02 VITALS — BP 120/90 | HR 72 | Temp 99.0°F | Ht 64.0 in | Wt 227.0 lb

## 2021-06-02 DIAGNOSIS — M79672 Pain in left foot: Secondary | ICD-10-CM | POA: Insufficient documentation

## 2021-06-02 NOTE — Patient Instructions (Addendum)
Ice/cold pack over area for 10-15 min twice daily.  OK to take Tylenol 1000 mg (2 extra strength tabs) or 975 mg (3 regular strength tabs) every 6 hours as needed.  Ibuprofen 400-600 mg (2-3 over the counter strength tabs) every 6 hours as needed for pain.  We will be in touch regarding the X-ray results.   Wear the flat-shoe while weight bearing as long as it is painful.   Let us know if you need anything.

## 2021-06-02 NOTE — Progress Notes (Signed)
Musculoskeletal Exam  Patient: Margaret Davis DOB: [birth year]  DOS: 06/02/2021  SUBJECTIVE:  Chief Complaint:   Chief Complaint  Patient presents with   Foot Pain    Left foot pain and swelling Slipped getting out of the shower on Monday 05/31/21 [facility] Day).    Margaret Davis is a 51 y.o.  female for evaluation and treatment of L foot pain.   Onset:  2 days ago. Slipped while getting out of the shower. Felt foot pull under her.  Location: top of L foot Character:  aching, sharp, and stinging   Progression of issue:  is unchanged Associated symptoms: swelling, bruising, limping No redness Treatment: to date has been: ice, OTC NSAIDS, and acetaminophen.   Neurovascular symptoms: no  Past Medical History:  Diagnosis Date   Anxiety    Elevated cholesterol    Essential hypertension 12/25/2019   GERD (gastroesophageal reflux disease)    OCCASIONAL   Mass    RIGHT OVARY  --ABDOMINAL PAIN   Panic attacks    Sleep apnea    Syncope    PT STATES HER HEART AND BRAIN "MISFIRE" IF OVERLY HEATED OR UPSET--AND PT WOULD PASS OUT --NO EPISODES SINCE 2000  - NO MEDICATIONS--PT NO LONGER SEES CARDIOLOGIST    Objective: VITAL SIGNS: BP 120/90   Pulse 72   Temp 99 F (37.2 C) (Oral)   Ht '5\' 4"'$  (1.626 m)   Wt 227 lb (103 kg)   LMP 06/04/2011   SpO2 99%   BMI 38.96 kg/m  Constitutional: Well formed, well developed. No acute distress. Thorax & Lungs: No accessory muscle use Musculoskeletal: L foot.   Tenderness to palpation: yes over distal 1st MT Deformity: soft tissue edema on dorsum of midfoot Ecchymosis: no Tests positive: mone Tests negative: squeeze, anterior drawer of ankle Neurologic: Normal sensory function. No focal deficits noted. Gait antalgic.  Psychiatric: Normal mood. Age appropriate judgment and insight. Alert & oriented x 3.    Assessment:  Left foot pain - Plan: DG Foot Complete Left  Plan: XR as above, ice, Tylenol, NSAIDs. Flat soled shoe  given. Monitor BP moving forward.  F/u pending above. The patient voiced understanding and agreement to the plan.   [place], DO 06/02/21  4:30 PM

## 2021-06-02 NOTE — Telephone Encounter (Signed)
Appt scheduled w/ Dr. Wendling.  

## 2021-07-06 ENCOUNTER — Emergency Department (HOSPITAL_BASED_OUTPATIENT_CLINIC_OR_DEPARTMENT_OTHER)
Admission: EM | Admit: 2021-07-06 | Discharge: 2021-07-06 | Disposition: A | Discharge status: Home / Self Care | Attending: Emergency Medicine | Admitting: Emergency Medicine

## 2021-07-06 ENCOUNTER — Other Ambulatory Visit: Payer: Self-pay

## 2021-07-06 ENCOUNTER — Encounter (HOSPITAL_BASED_OUTPATIENT_CLINIC_OR_DEPARTMENT_OTHER): Payer: Self-pay | Admitting: Urology

## 2021-07-06 DIAGNOSIS — T161XXA Foreign body in right ear, initial encounter: Secondary | ICD-10-CM | POA: Diagnosis present

## 2021-07-06 DIAGNOSIS — Y9301 Activity, walking, marching and hiking: Secondary | ICD-10-CM | POA: Insufficient documentation

## 2021-07-06 DIAGNOSIS — W57XXXA Bitten or stung by nonvenomous insect and other nonvenomous arthropods, initial encounter: Secondary | ICD-10-CM | POA: Insufficient documentation

## 2021-07-06 DIAGNOSIS — Y990 Civilian activity done for income or pay: Secondary | ICD-10-CM | POA: Diagnosis not present

## 2021-07-06 DIAGNOSIS — Z91040 Latex allergy status: Secondary | ICD-10-CM | POA: Insufficient documentation

## 2021-07-06 MED ORDER — LIDOCAINE-EPINEPHRINE (PF) 2 %-1:200000 IJ SOLN
20.0000 mL | Freq: Once | INTRAMUSCULAR | Status: AC
  Administered 2021-07-06: 20 mL
  Filled 2021-07-06: qty 20

## 2021-07-06 NOTE — Discharge Instructions (Signed)
Please return to ED with any new concerns or symptoms

## 2021-07-06 NOTE — ED Provider Notes (Signed)
[facility] EMERGENCY DEPARTMENT Provider Note   CSN: [id number] Arrival date & time: 07/06/21  2203     History  Chief Complaint  Patient presents with   Bug in [place] is a 51 y.o. female who presents to ED for evaluation of foreign body in right ear.  Patient states that prior to arrival she was walking over fireworks when she had the sensation/noise of a bug flying in her ear and swatted at it.  Patient reports that at this time she felt the bug crawl into her ear.  Patient states she went home and attempted to flush the bug out with water and hydrogen peroxide no avail.  Patient reported to ED at this time.  HPI     Home Medications Prior to Admission medications   Medication Sig Start Date End Date Taking? Authorizing Provider  azelastine (ASTELIN) 0.1 % nasal spray Place 1 spray into both nostrils 2 (two) times daily. Use in each nostril as directed 12/03/20   Maximiano Coss, NP  cholecalciferol (VITAMIN D) 1000 UNITS tablet Take 1,000 Units by mouth daily.    [provider]  clonazePAM (KLONOPIN) 0.5 MG tablet Take 1/ 2 or 1 twice daily as needed for anxiety 12/07/20   Copland, Gay Filler, MD  co-enzyme Q-10 30 MG capsule Take 200 mg by mouth daily.     [provider]  EPINEPHrine (EPIPEN 2-PAK) 0.3 mg/0.3 mL IJ SOAJ injection Inject 0.3 mg into the muscle as needed for anaphylaxis. 09/30/19   Copland, Gay Filler, MD  esomeprazole (NEXIUM) 40 MG capsule Take 40 mg by mouth. ONCE A DAY  IF NEEDED    [provider]  FLOVENT HFA 44 MCG/ACT inhaler Inhale 2 puffs into the lungs in the morning and at bedtime. 03/30/21   Copland, Gay Filler, MD  ipratropium (ATROVENT) 0.03 % nasal spray Place 2 sprays into both nostrils 3 (three) times daily. Use as needed for nasal drainage 03/29/21   Copland, Gay Filler, MD  loratadine (CLARITIN) 10 MG tablet Take 10 mg by mouth daily.    [provider]  montelukast (SINGULAIR) 10 MG  tablet Take 1 tablet (10 mg total) by mouth at bedtime. 03/25/21   Copland, Gay Filler, MD  predniSONE (DELTASONE) 20 MG tablet Take 40 mg daily for 3 days, then 20 mg daily for 3 days 03/19/21   Copland, Gay Filler, MD  simvastatin (ZOCOR) 20 MG tablet TAKE 1 TABLET(20 MG) BY MOUTH AT BEDTIME 11/05/20   Copland, Gay Filler, MD  traZODone (DESYREL) 50 MG tablet Take 0.5 tablets (25 mg total) by mouth at bedtime as needed for sleep. 10/07/20   Dohmeier, Asencion Partridge, MD      Allergies    Orange oil, Latex, Omeprazole, Other, and Phenobarbital    Review of Systems   Review of Systems  HENT:  Positive for ear pain.   All other systems reviewed and are negative.   Physical Exam Updated Vital Signs BP (!) 154/100 (BP Location: Left Arm)   Pulse 89   Temp 98.8 F (37.1 C) (Oral)   Resp 18   Ht '5\' 4"'$  (1.626 m)   Wt 105.7 kg   LMP 06/04/2011   SpO2 99%   BMI 39.99 kg/m  Physical Exam Vitals and nursing note reviewed.  Constitutional:      General: She is not in acute distress.    Appearance: Normal appearance. She is not ill-appearing, toxic-appearing or diaphoretic.  HENT:  Head: Normocephalic and atraumatic.     Ears:     Comments: Foreign body noted in right ear.     Nose: Nose normal. No congestion.     Mouth/Throat:     Mouth: Mucous membranes are moist.     Pharynx: Oropharynx is clear.  Eyes:     Extraocular Movements: Extraocular movements intact.     Conjunctiva/sclera: Conjunctivae normal.     Pupils: Pupils are equal, round, and reactive to light.  Cardiovascular:     Rate and Rhythm: Normal rate and regular rhythm.  Pulmonary:     Effort: Pulmonary effort is normal.     Breath sounds: Normal breath sounds. No wheezing.  Abdominal:     General: Abdomen is flat. Bowel sounds are normal.     Tenderness: There is no abdominal tenderness.  Musculoskeletal:     Cervical back: Normal range of motion and neck supple. No tenderness.  Neurological:     Mental Status: She is  alert.     ED Results / Procedures / Treatments   Labs (all labs ordered are listed, but only abnormal results are displayed) Labs Reviewed - No data to display  EKG None  Radiology No results found.  Procedures Procedures   Medications Ordered in ED Medications  lidocaine-EPINEPHrine (XYLOCAINE W/EPI) 2 %-1:200000 (PF) injection 20 mL (20 mLs Other Given 07/06/21 2218)    ED Course/ Medical Decision Making/ A&P                           Medical Decision Making Risk Prescription drug management.   89 old female presented to ED for evaluation.  Please see HPI for further details  Patient has what appears to be foreign body in right ear.  2% lidocaine was injected in the ear to paralyze foreign body, ear was flushed then.  Forceps used to retrieve bug out of ear canal successfully.  Patient states relief of pain at this time.  Patient discharged home in good condition.   Final Clinical Impression(s) / ED Diagnoses Final diagnoses:  Foreign body of right ear, initial encounter    Rx / DC Orders ED Discharge Orders     None         Lawana Chambers 07/06/21 2312    Drenda Freeze, MD 07/06/21 2320

## 2021-07-06 NOTE — ED Triage Notes (Signed)
Pt was sitting outside and had bug fly into right ear Pt still feels it moving

## 2021-07-06 NOTE — ED Notes (Signed)
R ear irrigation preformed.  Bug removed by MD

## 2021-07-07 ENCOUNTER — Encounter: Payer: Self-pay | Admitting: Family Medicine

## 2021-08-11 IMAGING — US US THYROID
1 series · 13 of 25 positions shown · non-contrast
Comparison: None.

CLINICAL DATA: Thyroid nodularity

EXAM:
THYROID ULTRASOUND
TECHNIQUE: Ultrasound examination of the thyroid gland and adjacent soft
tissues was performed.

[Series 1: us thyroid · 35 acquisitions, 13 frames shown]
[im 1/35]
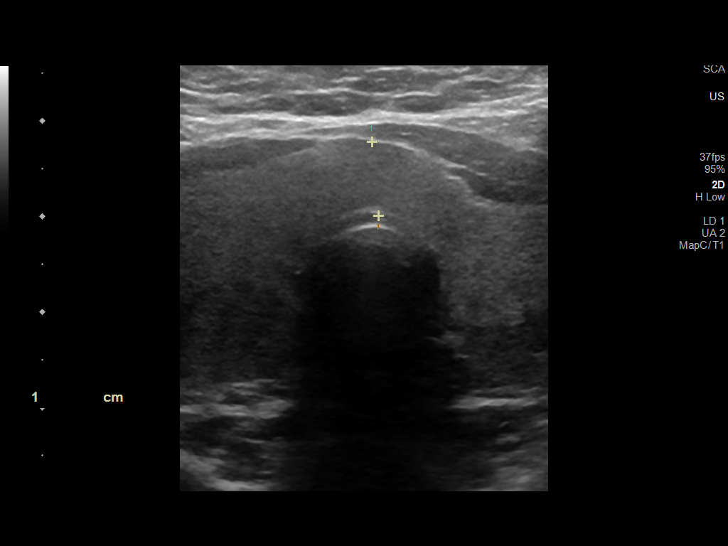
[im 3/35]
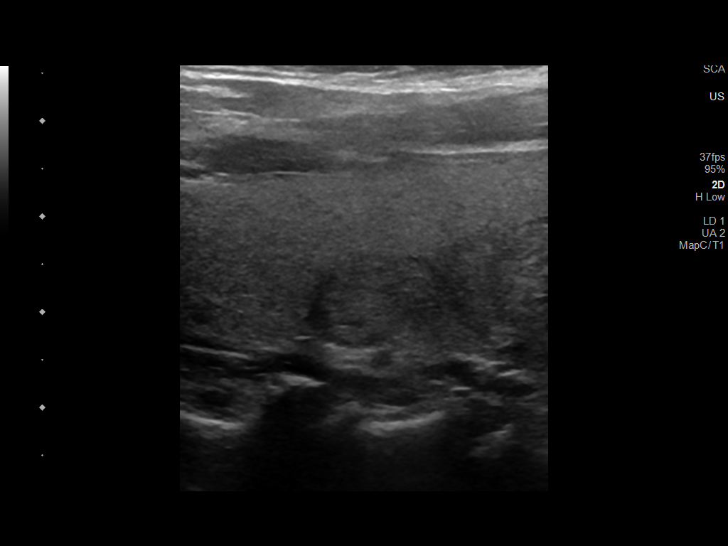
[im 6/35]
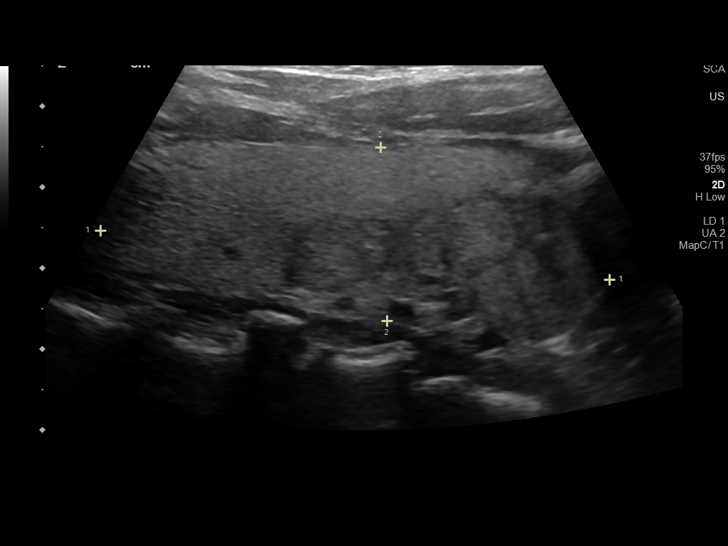
[im 9/35]
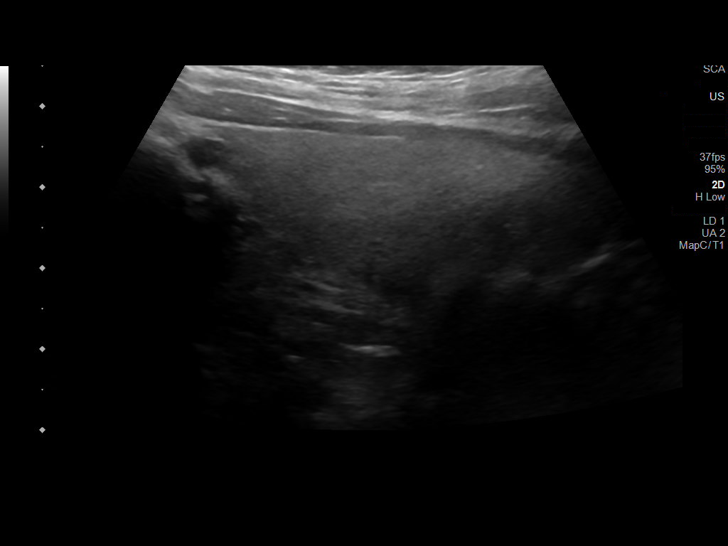
[im 12/35]
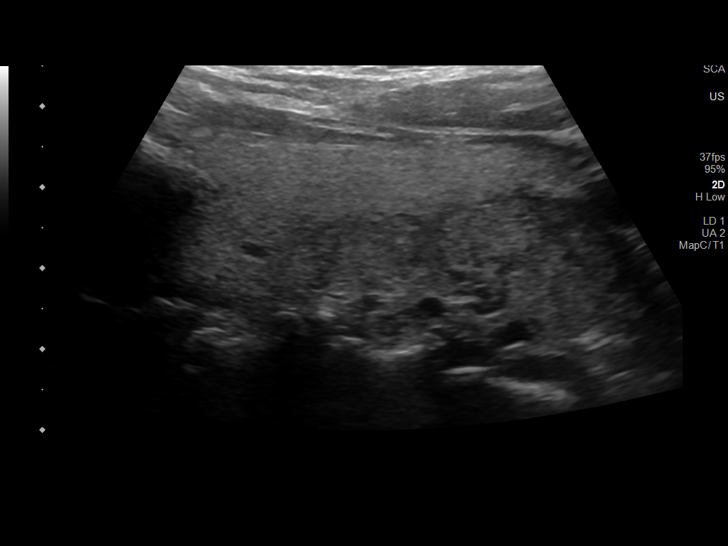
[im 15/35]
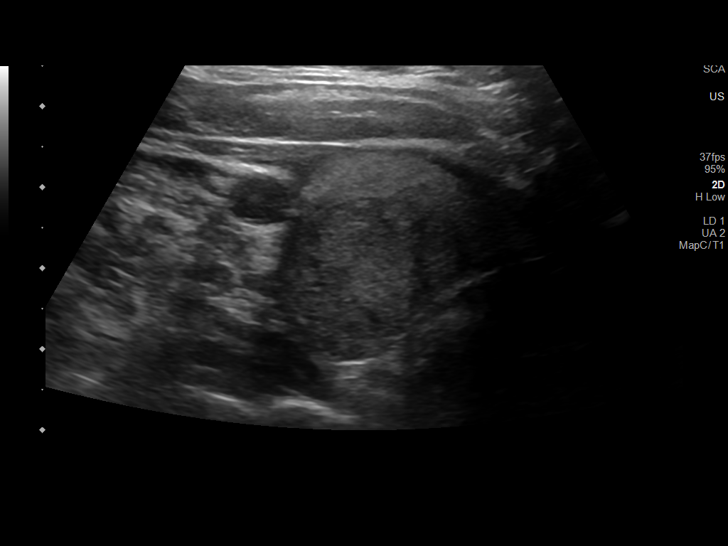
[im 18/35]
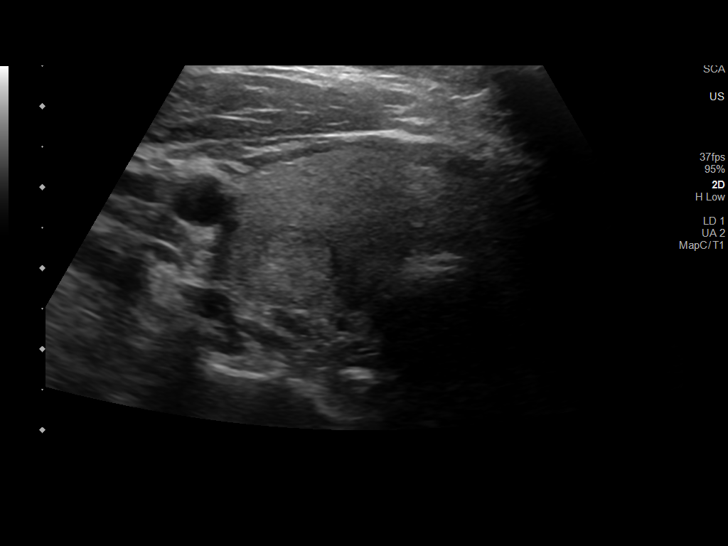
[im 20/35]
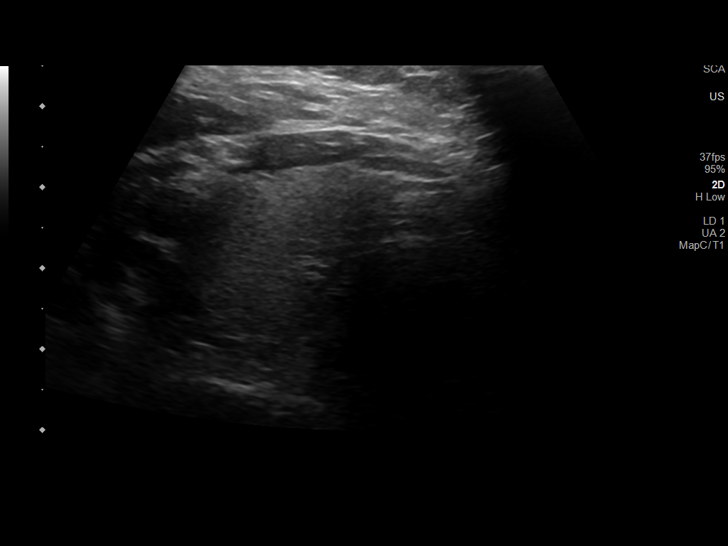
[im 23/35]
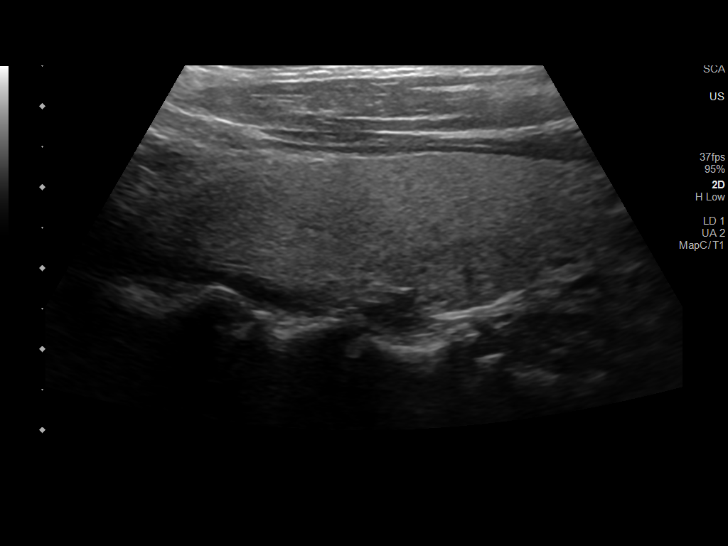
[im 26/35]
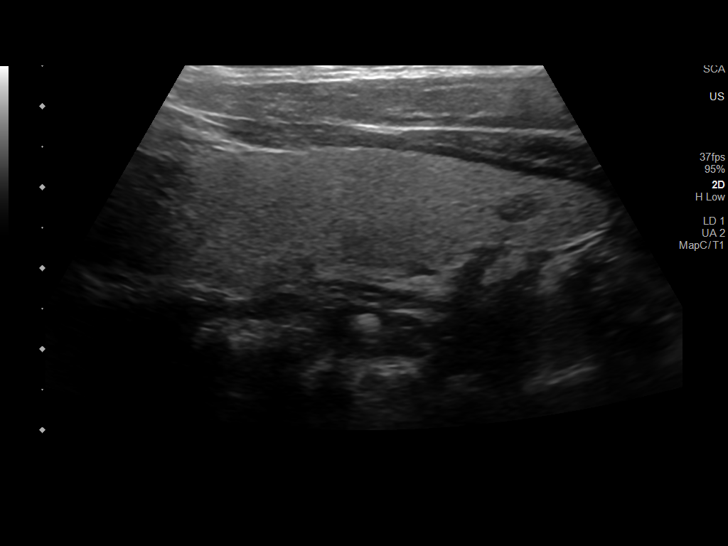
[im 29/35]
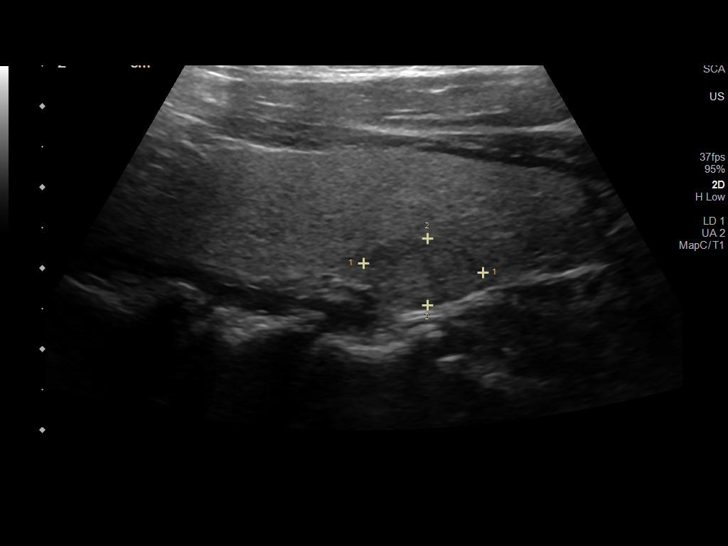
[im 32/35]
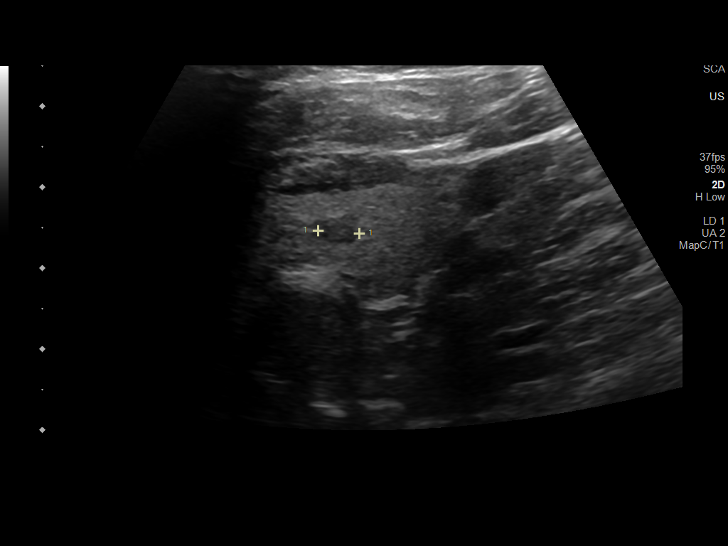
[im 35/35]
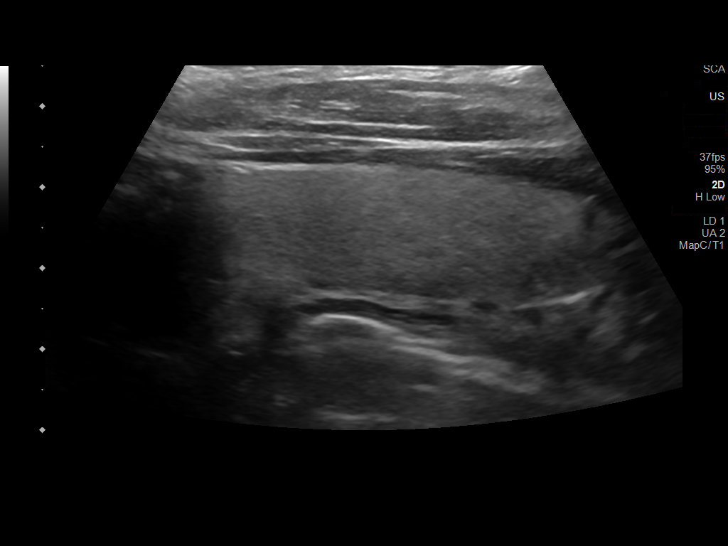

[13 of 25 positions shown; findings below may reference images not displayed]

FINDINGS: Parenchymal Echotexture: Moderately heterogenous

Isthmus: 0.8 cm

Right lobe: 6.3 x 2.1 x 2.2 cm

Left lobe: 5.7 x 1.9 x 1.8 cm

_________________________________________________________

Estimated total number of nodules >/= 1 cm: 3

Number of spongiform nodules >/=  2 cm not described below (TR1): 0

Number of mixed cystic and solid nodules >/= 1.5 cm not described
below (TR2): 0

_________________________________________________________

Nodule # 1:

Location: Right; Inferior

Maximum size: 2.4 cm; Other 2 dimensions: 1.8 x 2.2 cm

Composition: solid/almost completely solid (2)

Echogenicity: isoechoic (1)

Shape: not taller-than-wide (0)

Margins: smooth (0)

Echogenic foci: none (0)

ACR TI-RADS total points: 3.

ACR TI-RADS risk category: TR3 (3 points).

ACR TI-RADS recommendations:

*Given size (>/= 1.5 - 2.4 cm) and appearance, a follow-up
ultrasound in 1 year should be considered based on TI-RADS criteria.

_________________________________________________________

Nodule # 2:

Location: Right; Mid

Maximum size: 1.6 cm; Other 2 dimensions: 1 x 1.2 cm

Composition: solid/almost completely solid (2)

Echogenicity: isoechoic (1)

Shape: not taller-than-wide (0)

Margins: smooth (0)

Echogenic foci: none (0)

ACR TI-RADS total points: 3.

ACR TI-RADS risk category: TR3 (3 points).

ACR TI-RADS recommendations:

*Given size (>/= 1.5 - 2.4 cm) and appearance, a follow-up
ultrasound in 1 year should be considered based on TI-RADS criteria.

_________________________________________________________

Nodule # 5:

Location: Left; Inferior

Maximum size: 1.5 cm; Other 2 dimensions: 0.8 x 1 cm

Composition: solid/almost completely solid (2)

Echogenicity: isoechoic (1)

Shape: not taller-than-wide (0)

Margins: smooth (0)

Echogenic foci: none (0)

ACR TI-RADS total points: 3.

ACR TI-RADS risk category: TR3 (3 points).

ACR TI-RADS recommendations:

*Given size (>/= 1.5 - 2.4 cm) and appearance, a follow-up
ultrasound in 1 year should be considered based on TI-RADS criteria.

_________________________________________________________

There are no worrisome cervical lymph nodes identified on this exam.
IMPRESSION: 1. Moderately heterogeneous, multinodular goiter as detailed above.
2. There are 3 distinct TR3 thyroid nodules as detailed above. A a 1
year follow-up ultrasound is recommended for these 3 nodules.

The above is in keeping with the ACR TI-RADS recommendations - [HOSPITAL] 1636;[DATE].

## 2021-09-23 ENCOUNTER — Encounter (HOSPITAL_BASED_OUTPATIENT_CLINIC_OR_DEPARTMENT_OTHER): Payer: Self-pay | Admitting: Urology

## 2021-10-06 NOTE — Progress Notes (Unsigned)
PATIENT: Margaret Davis DOB: [birth year]  REASON FOR VISIT: follow up HISTORY FROM: patient  No chief complaint on file.    HISTORY OF PRESENT ILLNESS:  10/06/21 ALL:  Margaret Davis is a 51 y.o. female here today for follow up for OSA on CPAP and chronic loss of smell.      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 She is tearful as she reports her ageusia and anosmia sensory deficits.  She presented today for the first time since her sleep study, HST and start of CPAP therapy. Her home sleep test study date 12-09-2019 resulted in a total sleep time of 7 hours 10 minutes with 80% REM sleep, AHI was 17 which is a milder form of sleep apnea but during REM sleep AHI became 35.2 so this was not REM dependent form of sleep apnea requiring CPAP therapy.  He did not have prolonged hypoxia episodes and as far as I can see there was no positional component noted.  I ordered an auto titration device with a setting for pressure between 5 and 15 cmH2O to centimeter EPR and a mask of her choice with heated humidification, the patient has used her CPAP 31 out of 36 days or a compliance of 87% average user time 6 hours 27 minutes, 95th percentile pressure is 7.3 cmH2O, average air leak is 4.1 L/min, and her residual AHI was 0.6/h this is a very good resolution of sleep apnea. She feels more energy at work, but feels able to sleep well. She has trazodone available for those nights.      I repeated a smell test today- 10-07-2020 Cloves- did not identify it, but smelled something.  Lemon identified. Coconut identified. Almond - not smell identified, no taste sensation.  Lavender identified. Peppermint identified.  Grapefruit identified as CITRUS.    Margaret Davis is a 51 y.o. female and seen here upon a referral from Margaret Davis for a neuro consultation-  11-19-2019;   Chief concern according to patient :  I lost smell and taste after contracting COVID 19 in 2020, Dec 31 st. No hospitalization, just no energy, strength, all 4 family members were infected, sinus headaches. Only Margaret Davis had pneumonia, was SOB.    ICheryl L Davis is a right -handed 47 or [place] American female with a known OSA  sleep disorder. She  has a past medical history of Anxiety, Elevated cholesterol, Essential hypertension (12/25/2019), GERD (gastroesophageal reflux disease), Mass, Panic attacks, Sleep apnea, and Syncope. Her CPAP machine broke about 24 month ago, before the pandemic , but she feels she could sleep better if treated.  The patient had the first sleep study in the year 2010  with a result of OSA- done @ the former [facility].   Sleep relevant medical history: Margaret Davis has seen her for syncope.     Family medical /sleep history:No other family member on CPAP with OSA, insomnia, sleep walkers.    Social history: Patient is working as Automotive engineer and lives in a household with spouse and 2 children, 15 and 10 at this time.  The patient currently works full time.  Tobacco use: none  .  ETOH use; social ,  Caffeine intake in form of Coffee( /) Soda( /) Tea ( /) no energy drinks. Regular exercise in form of  walking.     Sleep habits are as follows: The patient's dinner time is between 7-8 PM. The patient goes to bed at 11.30 PM and continues to sleep for 5-6 hours.   The preferred sleep position is on her left side , with the support of 1 pillow on an inclined head of bed. . Dreams are reportedly frequent/vivid.  Tv is in the bedroom.  5.45  AM is the usual rise time. The patient wakes up with an alarm.  She reports not feeling refreshed or restored in AM, with symptoms such as dry mouth , morning headaches, and residual fatigue. Naps are taken ion weekends- falling asleep frequently,  lasting from 15-30 minutes and are more refreshing than nocturnal sleep.   REVIEW OF SYSTEMS: Out of a complete 14 system review of symptoms, the patient complains only of the following symptoms, and all other reviewed systems are negative.  ESS: 11/24 FSS 19/63  ALLERGIES: Allergies  Allergen Reactions   Orange Oil Hives    Patient reports she "can drink OJ without a problem."    Latex Hives    CONTACT WITH LATEX CAUSES HIVES   Omeprazole Other (See Comments)    Abdominal pain   Other     CAN'T EAT ORANGES-BUT CAN DRINK OJ   Phenobarbital     AS ACHILD--THOUGHT TO BE HAVING SEIZURES--HAD ALLERGIC REACTION TO PHENOBARBITAL.   LATER TOLD SHE DID NOT HAVE SEIZURES    HOME MEDICATIONS: Outpatient Medications Prior to Visit  Medication Sig Dispense Refill   azelastine (ASTELIN) 0.1 % nasal spray Place 1 spray into both nostrils 2 (two) times daily. Use in each nostril as directed 30 mL 12   cholecalciferol (VITAMIN D) 1000 UNITS tablet Take 1,000 Units by mouth daily.     clonazePAM (KLONOPIN) 0.5 MG tablet Take 1/ 2 or 1 twice daily as needed for anxiety 60 tablet 1   co-enzyme Q-10 30 MG capsule Take 200 mg by mouth daily.      EPINEPHrine (EPIPEN 2-PAK) 0.3 mg/0.3 mL IJ SOAJ injection Inject 0.3 mg into the muscle as needed for anaphylaxis. 1 each prn   esomeprazole (NEXIUM) 40 MG capsule Take 40 mg by mouth. ONCE A DAY  IF NEEDED     FLOVENT HFA 44 MCG/ACT inhaler Inhale 2 puffs into the lungs in the morning and at bedtime. 1 each 12   ipratropium (ATROVENT) 0.03 % nasal spray Place 2 sprays into both nostrils 3 (three) times daily. Use as needed for nasal drainage 30 mL 12   loratadine (CLARITIN) 10 MG tablet Take 10 mg by mouth daily.     montelukast (SINGULAIR) 10 MG tablet Take 1 tablet (10 mg total) by mouth at bedtime. 30 tablet 3   predniSONE (DELTASONE) 20 MG tablet Take 40 mg daily for 3 days, then 20 mg daily for 3 days 9 tablet 0   simvastatin (ZOCOR) 20 MG tablet TAKE  1 TABLET(20 MG) BY MOUTH AT BEDTIME 90 tablet 3   traZODone (DESYREL) 50 MG tablet Take 0.5 tablets (25 mg total) by mouth at bedtime as needed for sleep. 30 tablet 3   No facility-administered medications prior to visit.    PAST MEDICAL HISTORY: Past Medical History:  Diagnosis Date   Anxiety    Elevated cholesterol    Essential hypertension 12/25/2019   GERD (gastroesophageal reflux disease)    OCCASIONAL   Mass    RIGHT OVARY  --ABDOMINAL PAIN   Panic attacks    Sleep apnea  Syncope    PT STATES HER HEART AND BRAIN "MISFIRE" IF OVERLY HEATED OR UPSET--AND PT WOULD PASS OUT --NO EPISODES SINCE 2000  - NO MEDICATIONS--PT NO LONGER SEES CARDIOLOGIST    PAST SURGICAL HISTORY: Past Surgical History:  Procedure Laterality Date   ABDOMINAL HYSTERECTOMY  07/2011   partical   BIOPSY THYROID     COLONOSCOPY  04/2019   WISDOM TOOTH EXTRACTION      FAMILY HISTORY: Family History  Problem Relation Age of Onset   Diabetes Mother    Colon polyps Mother        benign   Hypertension Father    Colon cancer Neg Hx    Esophageal cancer Neg Hx     SOCIAL HISTORY: Social History   Socioeconomic History   Marital status: Married    Spouse name: Not on file   Number of children: 2   Years of education: Not on file   Highest education level: Not on file  Occupational History   Not on file  Tobacco Use   Smoking status: Never   Smokeless tobacco: Never  Vaping Use   Vaping Use: Never used  Substance and Sexual Activity   Alcohol use: No    Comment: rarely   Drug use: No   Sexual activity: Yes    Birth control/protection: Surgical  Other Topics Concern   Not on file  Social History Narrative   ** Merged History Encounter **       Social Determinants of Health   Financial Resource Strain: Not on file  Food Insecurity: Not on file  Transportation Needs: Not on file  Physical Activity: Not on file  Stress: Not on file  Social Connections: Not on file  Intimate  Partner Violence: Not on file     PHYSICAL EXAM  There were no vitals filed for this visit. There is no height or weight on file to calculate BMI.  Generalized: Well developed, in no acute distress  Cardiology: normal rate and rhythm, no murmur noted Respiratory: clear to auscultation bilaterally  Neurological examination  Mentation: Alert oriented to time, place, history taking. Follows all commands speech and language fluent Cranial nerve II-XII: Pupils were equal round reactive to light. Extraocular movements were full, visual field were full  Motor: The motor testing reveals 5 over 5 strength of all 4 extremities. Good symmetric motor tone is noted throughout.  Gait and station: Gait is normal.    DIAGNOSTIC DATA (LABS, IMAGING, TESTING) - I reviewed patient records, labs, notes, testing and imaging myself where available.      No data to display           Lab Results  Component Value Date   WBC 6.3 12/07/2020   HGB 13.3 12/07/2020   HCT 40.0 12/07/2020   MCV 92.1 12/07/2020   PLT 324.0 12/07/2020      Component Value Date/Time   NA 139 03/29/2021 1100   K 3.9 03/29/2021 1100   CL 102 03/29/2021 1100   CO2 30 03/29/2021 1100   GLUCOSE 112 (H) 03/29/2021 1100   BUN 11 03/29/2021 1100   CREATININE 0.86 03/29/2021 1100   CALCIUM 9.7 03/29/2021 1100   PROT 7.2 12/07/2020 1513   ALBUMIN 4.2 12/07/2020 1513   AST 16 12/07/2020 1513   ALT 14 12/07/2020 1513   ALKPHOS 72 12/07/2020 1513   BILITOT 0.3 12/07/2020 1513   GFRNONAA >90 09/06/2012 1225   GFRAA >90 09/06/2012 1225   Lab Results  Component Value  Date   CHOL 218 (H) 12/07/2020   HDL 75.70 12/07/2020   LDLCALC 112 (H) 12/07/2020   TRIG 149.0 12/07/2020   CHOLHDL 3 12/07/2020   Lab Results  Component Value Date   HGBA1C 5.8 12/07/2020   No results found for: "VITAMINB12" Lab Results  Component Value Date   TSH 2.12 12/07/2020     ASSESSMENT AND PLAN 51 y.o. year old female  has a past  medical history of Anxiety, Elevated cholesterol, Essential hypertension (12/25/2019), GERD (gastroesophageal reflux disease), Mass, Panic attacks, Sleep apnea, and Syncope. here with   No diagnosis found.    Margaret Davis is doing well on CPAP therapy. Compliance report reveals ***. *** was encouraged to continue using CPAP nightly and for greater than 4 hours each night. We will update supply orders as indicated. Risks of untreated sleep apnea review and education materials provided. Healthy lifestyle habits encouraged. *** will follow up in ***, sooner if needed. *** verbalizes understanding and agreement with this plan.    No orders of the defined types were placed in this encounter.    No orders of the defined types were placed in this encounter.     Debbora Presto, FNP-C 10/06/2021, 2:38 PM [facility] [address], [place], [place] [address] [phone]

## 2021-10-07 ENCOUNTER — Encounter: Payer: Self-pay | Admitting: *Deleted

## 2021-10-07 ENCOUNTER — Ambulatory Visit: Admitting: Family Medicine

## 2021-10-07 ENCOUNTER — Encounter: Payer: Self-pay | Admitting: Family Medicine

## 2021-10-07 VITALS — BP 134/98 | HR 87 | Ht 64.0 in | Wt 232.0 lb

## 2021-10-07 DIAGNOSIS — G4733 Obstructive sleep apnea (adult) (pediatric): Secondary | ICD-10-CM

## 2021-10-07 NOTE — Patient Instructions (Addendum)
Please continue using your CPAP regularly. While your insurance requires that you use CPAP at least 4 hours each night on 70% of the nights, I recommend, that you not skip any nights and use it throughout the night if you can. Getting used to CPAP and staying with the treatment long term does take time and patience and discipline. Untreated obstructive sleep apnea when it is moderate to severe can have an adverse impact on cardiovascular health and raise her risk for heart disease, arrhythmias, hypertension, congestive heart failure, stroke and diabetes. Untreated obstructive sleep apnea causes sleep disruption, nonrestorative sleep, and sleep deprivation. This can have an impact on your day to day functioning and cause daytime sleepiness and impairment of cognitive function, memory loss, mood disturbance, and problems focussing. Using CPAP regularly can improve these symptoms.   Please continue working on meeting compliance.Your compliance is low. I would encourage you to focus on getting more sleep. Consider setting an alarm on your phone.   Follow up with Dr Brett Fairy in 6 months

## 2021-10-11 ENCOUNTER — Telehealth: Payer: Self-pay

## 2021-10-15 ENCOUNTER — Encounter: Payer: Self-pay | Admitting: Family Medicine

## 2021-11-02 NOTE — Patient Instructions (Signed)
It was good to see again today, hope your back feels better soon

## 2021-11-02 NOTE — Progress Notes (Unsigned)
[place] [facility] [address] [place], [place] [address] [phone]  Date:  11/04/2021   Name:  Margaret Davis   DOB:  [birth year]   MRN:  [id number]  PCP:  Darreld Mclean, MD    Chief Complaint: No chief complaint on file.   History of Present Illness:  Margaret Davis is a 51 y.o. very pleasant female patient who presents with the following:  Patient seen today with concern of back pain- History of hypertension, OSA on CPAP, exertional dyspnea Most recent visit with myself was in March of this year  Pap screening-she is status post partial hysterectomy Flu vaccine Shingrix   Patient Active Problem List   Diagnosis Date Noted   Prediabetes 12/08/2020   Essential hypertension 12/25/2019   Dyspnea on exertion 12/25/2019   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 12/19/2019   Ageusia 12/19/2019   Anosmia 12/19/2019   Olfactory impairment 09/10/2019   History of COVID-19 09/10/2019   Hyperlipidemia 02/14/2019   OSA on CPAP 02/04/2013   Obesity (BMI 30-39.9) 02/04/2013   Syncope    GERD (gastroesophageal reflux disease)    Panic attacks     Past Medical History:  Diagnosis Date   Anxiety    Elevated cholesterol    Essential hypertension 12/25/2019   GERD (gastroesophageal reflux disease)    OCCASIONAL   Mass    RIGHT OVARY  --ABDOMINAL PAIN   Panic attacks    Sleep apnea    Syncope    PT STATES HER HEART AND BRAIN "MISFIRE" IF OVERLY HEATED OR UPSET--AND PT WOULD PASS OUT --NO EPISODES SINCE 2000  - NO MEDICATIONS--PT NO LONGER SEES CARDIOLOGIST    Past Surgical History:  Procedure Laterality Date   ABDOMINAL HYSTERECTOMY  07/2011   partical   BIOPSY THYROID     COLONOSCOPY  04/2019   WISDOM TOOTH EXTRACTION      Social History   Tobacco Use   Smoking status: Never   Smokeless tobacco: Never  Vaping Use   Vaping Use: Never used  Substance Use Topics    Alcohol use: No    Comment: rarely   Drug use: No    Family History  Problem Relation Age of Onset   Diabetes Mother    Colon polyps Mother        benign   Hypertension Father    Colon cancer Neg Hx    Esophageal cancer Neg Hx     Allergies  Allergen Reactions   Orange Oil Hives    Patient reports she "can drink OJ without a problem."    Latex Hives    CONTACT WITH LATEX CAUSES HIVES   Omeprazole Other (See Comments)    Abdominal pain   Other     CAN'T EAT ORANGES-BUT CAN DRINK OJ   Phenobarbital     AS ACHILD--THOUGHT TO BE HAVING SEIZURES--HAD ALLERGIC REACTION TO PHENOBARBITAL.   LATER TOLD SHE DID NOT HAVE SEIZURES    Medication list has been reviewed and updated.  Current Outpatient Medications on File Prior to Visit  Medication Sig Dispense Refill   cholecalciferol (VITAMIN D) 1000 UNITS tablet Take 1,000 Units by mouth daily.     clonazePAM (KLONOPIN) 0.5 MG tablet Take 1/ 2 or 1 twice daily as needed for anxiety 60 tablet 1   co-enzyme Q-10 30 MG capsule Take 200 mg by mouth daily.      EPINEPHrine (  EPIPEN 2-PAK) 0.3 mg/0.3 mL IJ SOAJ injection Inject 0.3 mg into the muscle as needed for anaphylaxis. 1 each prn   esomeprazole (NEXIUM) 40 MG capsule Take 40 mg by mouth. ONCE A DAY  IF NEEDED     FLOVENT HFA 44 MCG/ACT inhaler Inhale 2 puffs into the lungs in the morning and at bedtime. 1 each 12   ipratropium (ATROVENT) 0.03 % nasal spray Place 2 sprays into both nostrils 3 (three) times daily. Use as needed for nasal drainage 30 mL 12   loratadine (CLARITIN) 10 MG tablet Take 10 mg by mouth daily.     montelukast (SINGULAIR) 10 MG tablet Take 1 tablet (10 mg total) by mouth at bedtime. 30 tablet 3   predniSONE (DELTASONE) 20 MG tablet Take 40 mg daily for 3 days, then 20 mg daily for 3 days 9 tablet 0   simvastatin (ZOCOR) 20 MG tablet TAKE 1 TABLET(20 MG) BY MOUTH AT BEDTIME 90 tablet 3   traZODone (DESYREL) 50 MG tablet Take 0.5 tablets (25 mg total) by mouth at  bedtime as needed for sleep. 30 tablet 3   No current facility-administered medications on file prior to visit.    Review of Systems:  As per HPI- otherwise negative.   Physical Examination: There were no vitals filed for this visit. There were no vitals filed for this visit. There is no height or weight on file to calculate BMI. Ideal Body Weight:    GEN: no acute distress. HEENT: Atraumatic, Normocephalic.  Ears and Nose: No external deformity. CV: RRR, No M/G/R. No JVD. No thrill. No extra heart sounds. PULM: CTA B, no wheezes, crackles, rhonchi. No retractions. No resp. distress. No accessory muscle use. ABD: S, NT, ND, +BS. No rebound. No HSM. EXTR: No c/c/e PSYCH: Normally interactive. Conversant.    Assessment and Plan: ***  Signed Lamar Blinks, MD

## 2021-11-04 ENCOUNTER — Encounter (HOSPITAL_BASED_OUTPATIENT_CLINIC_OR_DEPARTMENT_OTHER): Payer: Self-pay | Admitting: Emergency Medicine

## 2021-11-04 ENCOUNTER — Other Ambulatory Visit: Payer: Self-pay

## 2021-11-04 ENCOUNTER — Encounter: Payer: Self-pay | Admitting: Family Medicine

## 2021-11-04 ENCOUNTER — Emergency Department (HOSPITAL_BASED_OUTPATIENT_CLINIC_OR_DEPARTMENT_OTHER)

## 2021-11-04 ENCOUNTER — Ambulatory Visit (INDEPENDENT_AMBULATORY_CARE_PROVIDER_SITE_OTHER): Admitting: Family Medicine

## 2021-11-04 ENCOUNTER — Emergency Department (HOSPITAL_BASED_OUTPATIENT_CLINIC_OR_DEPARTMENT_OTHER)
Admission: EM | Admit: 2021-11-04 | Discharge: 2021-11-04 | Disposition: A | Discharge status: Home / Self Care | Attending: Emergency Medicine | Admitting: Emergency Medicine

## 2021-11-04 VITALS — BP 180/100 | HR 79 | Temp 97.9°F | Resp 18 | Ht 64.0 in | Wt 235.6 lb

## 2021-11-04 DIAGNOSIS — I10 Essential (primary) hypertension: Secondary | ICD-10-CM | POA: Diagnosis present

## 2021-11-04 DIAGNOSIS — F410 Panic disorder [episodic paroxysmal anxiety] without agoraphobia: Secondary | ICD-10-CM

## 2021-11-04 DIAGNOSIS — Z91040 Latex allergy status: Secondary | ICD-10-CM | POA: Insufficient documentation

## 2021-11-04 DIAGNOSIS — Z79899 Other long term (current) drug therapy: Secondary | ICD-10-CM | POA: Diagnosis not present

## 2021-11-04 DIAGNOSIS — R030 Elevated blood-pressure reading, without diagnosis of hypertension: Secondary | ICD-10-CM

## 2021-11-04 LAB — CBC
HCT: 42.2 % (ref 36.0–46.0)
Hemoglobin: 14.2 g/dL (ref 12.0–15.0)
MCH: 30.3 pg (ref 26.0–34.0)
MCHC: 33.6 g/dL (ref 30.0–36.0)
MCV: 90.2 fL (ref 80.0–100.0)
Platelets: 353 10*3/uL (ref 150–400)
RBC: 4.68 MIL/uL (ref 3.87–5.11)
RDW: 12.6 % (ref 11.5–15.5)
WBC: 6.3 10*3/uL (ref 4.0–10.5)
nRBC: 0 % (ref 0.0–0.2)

## 2021-11-04 LAB — URINALYSIS, ROUTINE W REFLEX MICROSCOPIC
Bilirubin Urine: NEGATIVE
Glucose, UA: NEGATIVE mg/dL
Hgb urine dipstick: NEGATIVE
Ketones, ur: NEGATIVE mg/dL
Leukocytes,Ua: NEGATIVE
Nitrite: NEGATIVE
Protein, ur: NEGATIVE mg/dL
Specific Gravity, Urine: 1.01 (ref 1.005–1.030)
pH: 7 (ref 5.0–8.0)

## 2021-11-04 LAB — PREGNANCY, URINE: Preg Test, Ur: NEGATIVE

## 2021-11-04 LAB — BASIC METABOLIC PANEL
Anion gap: 8 (ref 5–15)
BUN: 9 mg/dL (ref 6–20)
CO2: 26 mmol/L (ref 22–32)
Calcium: 9.9 mg/dL (ref 8.9–10.3)
Chloride: 105 mmol/L (ref 98–111)
Creatinine, Ser: 0.72 mg/dL (ref 0.44–1.00)
GFR, Estimated: >60 mL/min (ref >60)
Glucose, Bld: 104 mg/dL — ABNORMAL HIGH (ref 70–99)
Potassium: 3 mmol/L — ABNORMAL LOW (ref 3.5–5.1)
Sodium: 139 mmol/L (ref 135–145)

## 2021-11-04 LAB — TROPONIN I (HIGH SENSITIVITY)
Troponin I (High Sensitivity): 4 ng/L (ref <18)
Troponin I (High Sensitivity): 4 ng/L (ref <18)

## 2021-11-04 MED ORDER — AMLODIPINE BESYLATE 5 MG PO TABS
2.5000 mg | ORAL_TABLET | Freq: Once | ORAL | Status: AC
  Administered 2021-11-04: 2.5 mg via ORAL
  Filled 2021-11-04: qty 1

## 2021-11-04 MED ORDER — CLONAZEPAM 0.5 MG PO TABS: ORAL_TABLET | ORAL | 1 refills | Status: DC

## 2021-11-04 MED ORDER — POTASSIUM CHLORIDE CRYS ER 20 MEQ PO TBCR
60.0000 meq | EXTENDED_RELEASE_TABLET | ORAL | Status: AC
  Administered 2021-11-04: 60 meq via ORAL
  Filled 2021-11-04: qty 3

## 2021-11-04 MED ORDER — HYDRALAZINE HCL 10 MG PO TABS
10.0000 mg | ORAL_TABLET | Freq: Once | ORAL | Status: AC
  Administered 2021-11-04: 10 mg via ORAL
  Filled 2021-11-04: qty 1

## 2021-11-04 MED ORDER — AMLODIPINE BESYLATE 2.5 MG PO TABS: 2.5000 mg | ORAL_TABLET | Freq: Every day | ORAL | 0 refills | Status: DC

## 2021-11-04 NOTE — ED Provider Notes (Signed)
[facility] EMERGENCY DEPARTMENT Provider Note   CSN: [id number] Arrival date & time: 11/04/21  1631     History  Chief Complaint  Patient presents with   Hypertension    Margaret Davis is a 51 y.o. female.  51 year old female with a history of hypertension that had improved with lifestyle changes (not currently on medication) who presents emergency department with hypertension and neck discomfort.  Patient states that she has been stressed recently and as result has been feeling left-sided neck discomfort.  Says that she has been stressed from work and then had a dental implant that was going to be placed today when he checked her blood pressure was very elevated.  Went to her primary doctor afterwards and reported that she had been having reflux-like symptoms on the top of her chest.  Says that she used to be on several medications for this but it stopped.  Says that it is a pressure-like sensation but that it is not associated with walking, exertion, deep breath, diaphoresis, or nausea or vomiting.  Says that the neck pain and the chest discomfort are 2 different sensations.    Past Medical History:  Diagnosis Date   Anxiety    Elevated cholesterol    Essential hypertension 12/25/2019   GERD (gastroesophageal reflux disease)    OCCASIONAL   Mass    RIGHT OVARY  --ABDOMINAL PAIN   Panic attacks    Sleep apnea    Syncope    PT STATES HER HEART AND BRAIN "MISFIRE" IF OVERLY HEATED OR UPSET--AND PT WOULD PASS OUT --NO EPISODES SINCE 2000  - NO MEDICATIONS--PT NO LONGER SEES CARDIOLOGIST       Home Medications Prior to Admission medications   Medication Sig Start Date End Date Taking? Authorizing Provider  amLODipine (NORVASC) 2.5 MG tablet Take 1 tablet (2.5 mg total) by mouth daily. 11/04/21 12/04/21 Yes Fransico Meadow, MD  cholecalciferol (VITAMIN D) 1000 UNITS tablet Take 1,000 Units by mouth daily.    [provider]  clonazePAM (KLONOPIN) 0.5 MG  tablet Take 1/ 2 or 1 twice daily as needed for anxiety 12/07/20   Copland, Gay Filler, MD  EPINEPHrine (EPIPEN 2-PAK) 0.3 mg/0.3 mL IJ SOAJ injection Inject 0.3 mg into the muscle as needed for anaphylaxis. 09/30/19   Copland, Gay Filler, MD  esomeprazole (NEXIUM) 40 MG capsule Take 40 mg by mouth. ONCE A DAY  IF NEEDED    [provider]  FLOVENT HFA 44 MCG/ACT inhaler Inhale 2 puffs into the lungs in the morning and at bedtime. 03/30/21   Copland, Gay Filler, MD  loratadine (CLARITIN) 10 MG tablet Take 10 mg by mouth daily.    [provider]  montelukast (SINGULAIR) 10 MG tablet Take 1 tablet (10 mg total) by mouth at bedtime. 03/25/21   Copland, Gay Filler, MD  simvastatin (ZOCOR) 20 MG tablet TAKE 1 TABLET(20 MG) BY MOUTH AT BEDTIME 11/05/20   Copland, Gay Filler, MD      Allergies    Orange oil, Latex, Omeprazole, Other, and Phenobarbital    Review of Systems   Review of Systems  Physical Exam Updated Vital Signs BP (!) 168/100 (BP Location: Right Arm)   Pulse 66   Temp 98 F (36.7 C) (Oral)   Resp 17   Ht '5\' 4"'$  (1.626 m)   Wt 107 kg   LMP 06/04/2011   SpO2 100%   BMI 40.49 kg/m  Physical Exam Vitals and nursing note reviewed.  Constitutional:  General: She is not in acute distress.    Appearance: She is well-developed.  HENT:     Head: Normocephalic and atraumatic.     Right Ear: External ear normal.     Left Ear: External ear normal.     Nose: Nose normal.  Eyes:     Extraocular Movements: Extraocular movements intact.     Conjunctiva/sclera: Conjunctivae normal.     Pupils: Pupils are equal, round, and reactive to light.  Cardiovascular:     Rate and Rhythm: Normal rate and regular rhythm.     Heart sounds: No murmur heard. Pulmonary:     Effort: Pulmonary effort is normal. No respiratory distress.     Breath sounds: Normal breath sounds.  Musculoskeletal:        General: No swelling.     Cervical back: Normal range of motion and neck supple.      Right lower leg: No edema.     Left lower leg: No edema.  Skin:    General: Skin is warm and dry.  Neurological:     Mental Status: She is alert and oriented to person, place, and time. Mental status is at baseline.  Psychiatric:        Mood and Affect: Mood normal.     ED Results / Procedures / Treatments   Labs (all labs ordered are listed, but only abnormal results are displayed) Labs Reviewed  BASIC METABOLIC PANEL - Abnormal; Notable for the following components:      Result Value   Potassium 3.0 (*)    Glucose, Bld 104 (*)    All other components within normal limits  CBC  PREGNANCY, URINE  URINALYSIS, ROUTINE W REFLEX MICROSCOPIC  CBG MONITORING, ED  TROPONIN I (HIGH SENSITIVITY)  TROPONIN I (HIGH SENSITIVITY)    EKG EKG Interpretation  Date/Time:  Thursday November 04 2021 16:47:46 EDT Ventricular Rate:  88 PR Interval:  166 QRS Duration: 122 QT Interval:  399 QTC Calculation: 483 R Axis:   29 Text Interpretation: Sinus rhythm Nonspecific intraventricular conduction delay When compared to 09/06/21 nonspecific conduction delay noted Confirmed by Margaretmary Eddy [id number]) on 11/04/2021 5:30:30 PM  Radiology DG Chest 2 View  Result Date: 11/04/2021 CLINICAL DATA:  Chest pain with hypertension EXAM: CHEST - 2 VIEW COMPARISON:  Chest x-ray 02/14/2019 FINDINGS: The heart size and mediastinal contours are within normal limits. Both lungs are clear. The visualized skeletal structures are unremarkable. IMPRESSION: No active cardiopulmonary disease. Electronically Signed   By: Ronney Asters M.D.   On: 11/04/2021 16:58    Procedures Procedures   Medications Ordered in ED Medications  amLODipine (NORVASC) tablet 2.5 mg (has no administration in time range)  potassium chloride SA (KLOR-CON M) CR tablet 60 mEq (60 mEq Oral Given 11/04/21 1925)  hydrALAZINE (APRESOLINE) tablet 10 mg (10 mg Oral Given 11/04/21 1928)    ED Course/ Medical Decision Making/ A&P                            Medical Decision Making Amount and/or Complexity of Data Reviewed Labs: ordered. Radiology: ordered.  Risk Prescription drug management.   Margaret Davis is a 51 y.o. female with comorbidities that complicate the patient evaluation including  hypertension that had improved with lifestyle changes (not currently on medication) who presents emergency department with hypertension and neck discomfort.   This patient presents to the ED for concern of complaints listed in HPI, this involves an  extensive number of treatment options, and is a complaint that carries with it a high risk of complications and morbidity. Disposition including potential need for admission considered.   Initial Ddx:  MI, hypertensive emergency, MSK strain, reflux  MDM:  Feel the patient likely is having MSK strain and reflux.  Describes the neck pain in the chest discomfort is 2 different sensations which she has had before.  Does not appear to be exertional and does not have any other anginal equivalent symptoms at this time.  However will obtain EKG and serial troponins given her severely elevated blood pressure.  We will give her hydralazine in the emergency department as well.  Plan:  Labs Troponin EKG Chest x-ray Hydralazine  ED Summary/Re-evaluation:  Patient was reassessed and was doing well.  No chest pain or other concerning symptoms in the emergency department.  Blood pressure improved with the hydralazine.  Discussed outpatient treatment of her hypertension with her after serial troponins were negative and stated that she became hypotensive and symptomatic on 5 mg of amlodipine so we will start off with 2.5 mg of amlodipine as I feel that her blood pressure today may be exacerbated by her anxiety around having the dental procedure as well as seeing multiple providers today.  We will have her follow-up with her primary doctor in a week to see where her blood pressure is and they can uptitrate the  amlodipine if needed.  Dispo: DC Home. Return precautions discussed including, but not limited to, those listed in the AVS. Allowed pt time to ask questions which were answered fully prior to dc.   Additional history obtained from spouse Records reviewed Outpatient Clinic Notes The following labs were independently interpreted: Serial Troponins and show no acute abnormality I independently reviewed the following imaging with scope of interpretation limited to determining acute life threatening conditions related to emergency care: Chest x-ray, which revealed no acute abnormality  I personally reviewed and interpreted cardiac monitoring: normal sinus rhythm  I personally reviewed and interpreted the pt's EKG: see above for interpretation  I have reviewed the patients home medications and made adjustments as needed  Final Clinical Impression(s) / ED Diagnoses Final diagnoses:  Primary hypertension    Rx / DC Orders ED Discharge Orders          Ordered    amLODipine (NORVASC) 2.5 MG tablet  Daily        11/04/21 2047              Fransico Meadow, MD 11/04/21 2052

## 2021-11-04 NOTE — Addendum Note (Signed)
Addended by: Lamar Blinks C on: 11/04/2021 09:25 PM   Modules accepted: Orders

## 2021-11-04 NOTE — Discharge Instructions (Signed)
Today you were seen in the emergency department for your elevated blood pressure.    In the emergency department you had an EKG and troponins that were reassuring you were given a medication called hydralazine for your blood pressure.    At home, please take the amlodipine we have prescribed you.    Check your MyChart online for the results of any tests that had not resulted by the time you left the emergency department.   Follow-up with your primary doctor in 1 week regarding your visit.    Return immediately to the emergency department if you experience any of the following: Chest pain, shortness of breath, dizziness, fainting, or any other concerning symptoms.    Thank you for visiting our Emergency Department. It was a pleasure taking care of you today.

## 2021-11-04 NOTE — ED Triage Notes (Signed)
Pt reports elevated BP since this morning. Also c/o left shoulder "tightness" and discomfort for "weeks" States this is an ongoing issues. Sent here from PCP for further eval.

## 2021-11-04 NOTE — ED Notes (Signed)
Pt saw dental surgeon today for dental implants and BP, procedure was cancelled  Pt the scheduled to see DR Edilia Bo and was seen in his office and was sent here for further evaluation of high BP and left shoulder discomfort she has had for several weeks that she was contributing to reflux.   Pt was on Bp meds a year ago and after losing weight no longer needed to be treated.

## 2021-11-08 NOTE — Telephone Encounter (Signed)
Please advise: pt was seen 11/04/21 and sent to ED. No appointments today.

## 2021-11-09 NOTE — Progress Notes (Addendum)
[facility] [address], [address] [place], [place] [address] [phone]  Date:  11/10/2021   Name:  Margaret Davis   DOB:  [birth year]   MRN:  [id number]  PCP:  Pearline Cables, MD    Chief Complaint: Blood Pressure Check (Seen at the ED on 11/04/21 for elevated BP- started on Amlodipine 2.5 mg. Is now taking 2 tabs daily. /Flu shot today: received around 09/03/21 at [facility] /)   History of Present Illness:  Margaret Davis is a 51 y.o. very pleasant female patient who presents with the following:  Patient seen today for blood pressure follow-up She was seen in my office on 11/2-earlier that day she had been in the oral surgeons office to have a dental implant but was noted to have quite high blood pressure.  Procedure was stopped and she was sent to my office.  However, on discussion there was concern of chest pain, she was referred to the emergency department for chest pain rule out.  She was cleared and released to home  She was given a prescription for amlodipine at the ER, 2.5 mg Her blood work did show hypokalemia with potassium of 3.0, blood counts normal  She is using amlodipine 5 mg daily right now and is tolerating well  No SE noted so far, no lower extremity swelling  She has noted a few dry and itchy spots just on her left hand for a couple of weeks She noted lower back pain off and on- she had bent over with a load of laundry and had sudden onset of back pain perhaps a week ago Not hurting in her legs- the pain does not radiate down the backs of her legs, no lower extremity numbness or weakness  Patient Active Problem List   Diagnosis Date Noted   Prediabetes 12/08/2020   Essential hypertension 12/25/2019   Dyspnea on exertion 12/25/2019   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 12/19/2019   Ageusia 12/19/2019   Anosmia 12/19/2019   Olfactory impairment 09/10/2019   History of  COVID-19 09/10/2019   Hyperlipidemia 02/14/2019   OSA on CPAP 02/04/2013   Obesity (BMI 30-39.9) 02/04/2013   Syncope    GERD (gastroesophageal reflux disease)    Panic attacks     Past Medical History:  Diagnosis Date   Anxiety    Elevated cholesterol    Essential hypertension 12/25/2019   GERD (gastroesophageal reflux disease)    OCCASIONAL   Mass    RIGHT OVARY  --ABDOMINAL PAIN   Panic attacks    Sleep apnea    Syncope    PT STATES HER HEART AND BRAIN "MISFIRE" IF OVERLY HEATED OR UPSET--AND PT WOULD PASS OUT --NO EPISODES SINCE 2000  - NO MEDICATIONS--PT NO LONGER SEES CARDIOLOGIST    Past Surgical History:  Procedure Laterality Date   ABDOMINAL HYSTERECTOMY  07/2011   partical   BIOPSY THYROID     COLONOSCOPY  04/2019   WISDOM TOOTH EXTRACTION      Social History   Tobacco Use   Smoking status: Never   Smokeless tobacco: Never  Vaping Use   Vaping Use: Never used  Substance Use Topics   Alcohol use: No    Comment: rarely   Drug use: No    Family History  Problem Relation Age of Onset   Diabetes Mother    Colon polyps Mother  benign   Hypertension Father    Colon cancer Neg Hx    Esophageal cancer Neg Hx     Allergies  Allergen Reactions   Orange Oil Hives    Patient reports she "can drink OJ without a problem."   Latex Hives and Other (See Comments)    CONTACT WITH LATEX CAUSES HIVES   Omeprazole Other (See Comments)    Abdominal pain   Other     CAN'T EAT ORANGES-BUT CAN DRINK OJ   Phenobarbital Other (See Comments)    AS ACHILD--THOUGHT TO BE HAVING SEIZURES--HAD ALLERGIC REACTION TO PHENOBARBITAL.   LATER TOLD SHE DID NOT HAVE SEIZURES    Medication list has been reviewed and updated.  Current Outpatient Medications on File Prior to Visit  Medication Sig Dispense Refill   amLODipine (NORVASC) 2.5 MG tablet Take 1 tablet (2.5 mg total) by mouth daily. 30 tablet 0   cholecalciferol (VITAMIN D) 1000 UNITS tablet Take 1,000 Units  by mouth daily.     clonazePAM (KLONOPIN) 0.5 MG tablet Take 1/ 2 or 1 twice daily as needed for anxiety 45 tablet 1   EPINEPHrine (EPIPEN 2-PAK) 0.3 mg/0.3 mL IJ SOAJ injection Inject 0.3 mg into the muscle as needed for anaphylaxis. 1 each prn   esomeprazole (NEXIUM) 40 MG capsule Take 40 mg by mouth. ONCE A DAY  IF NEEDED     FLOVENT HFA 44 MCG/ACT inhaler Inhale 2 puffs into the lungs in the morning and at bedtime. 1 each 12   loratadine (CLARITIN) 10 MG tablet Take 10 mg by mouth daily.     montelukast (SINGULAIR) 10 MG tablet Take 1 tablet (10 mg total) by mouth at bedtime. 30 tablet 3   simvastatin (ZOCOR) 20 MG tablet TAKE 1 TABLET(20 MG) BY MOUTH AT BEDTIME 90 tablet 3   No current facility-administered medications on file prior to visit.    Review of Systems:  As per HPI- otherwise negative.   Physical Examination: Vitals:   11/10/21 1446  BP: 124/82  Pulse: 79  Resp: 18  Temp: 97.9 F (36.6 C)  SpO2: 99%   Vitals:   11/10/21 1446  Weight: 223 lb 9.6 oz (101.4 kg)  Height: 5\' 4"  (1.626 m)   Body mass index is 38.38 kg/m. Ideal Body Weight: Weight in (lb) to have BMI = 25: 145.3  GEN: no acute distress.  Obese, looks well  HEENT: Atraumatic, Normocephalic.  Ears and Nose: No external deformity. CV: RRR, No M/G/R. No JVD. No thrill. No extra heart sounds. PULM: CTA B, no wheezes, crackles, rhonchi. No retractions. No resp. distress. No accessory muscle use. ABD: S, NT, ND, EXTR: No c/c/e PSYCH: Normally interactive. Conversant.  The skin on her hand at this time appears normal Normal thoracolumbar flexion and extension, no bony tenderness to palpation.  The patient does note some muscle soreness and there is muscle tightness over the trapezius and lower back muscles  Assessment and Plan: Elevated blood pressure reading - Plan: amLODipine (NORVASC) 5 MG tablet, CT CARDIAC SCORING (SELF PAY ONLY)  Hypokalemia - Plan: Comprehensive metabolic panel  Acute  bilateral low back pain without sciatica - Plan: methocarbamol (ROBAXIN) 500 MG tablet  Gastroesophageal reflux disease, unspecified whether esophagitis present - Plan: pantoprazole (PROTONIX) 40 MG tablet  Blood pressure looks much better on amlodipine 5, we will have her continue this dose.  Discussed and ordered CT coronary calcium Follow-up on mild hypokalemia today Likely muscular back pain.  Prescribed Robaxin use as needed,  advised this can cause sedation.  If she needs to also use her clonazepam at bedtime take 1/2 tablet of clonazepam Patient notes her reflux has gotten worse again.  A nurse suggested that she try pantoprazole-she does have intolerance to omeprazole but this is abdominal pain only, not a true allergy.  Prescription written for pantoprazole, she will let me know how this works for her  Signed Abbe Amsterdam, MD 11/9 Received labs, message to pt  Results for orders placed or performed in visit on 11/10/21  Comprehensive metabolic panel  Result Value Ref Range   Sodium 139 135 - 145 mEq/L   Potassium 3.8 3.5 - 5.1 mEq/L   Chloride 102 96 - 112 mEq/L   CO2 29 19 - 32 mEq/L   Glucose, Bld 92 70 - 99 mg/dL   BUN 9 6 - 23 mg/dL   Creatinine, Ser 1.61 0.40 - 1.20 mg/dL   Total Bilirubin 0.4 0.2 - 1.2 mg/dL   Alkaline Phosphatase 75 39 - 117 U/L   AST 14 0 - 37 U/L   ALT 13 0 - 35 U/L   Total Protein 7.6 6.0 - 8.3 g/dL   Albumin 4.6 3.5 - 5.2 g/dL   GFR 09.60 >45.40 mL/min   Calcium 9.8 8.4 - 10.5 mg/dL

## 2021-11-10 ENCOUNTER — Ambulatory Visit (INDEPENDENT_AMBULATORY_CARE_PROVIDER_SITE_OTHER): Admitting: Family Medicine

## 2021-11-10 VITALS — BP 124/82 | HR 79 | Temp 97.9°F | Resp 18 | Ht 64.0 in | Wt 223.6 lb

## 2021-11-10 DIAGNOSIS — K219 Gastro-esophageal reflux disease without esophagitis: Secondary | ICD-10-CM | POA: Diagnosis not present

## 2021-11-10 DIAGNOSIS — E876 Hypokalemia: Secondary | ICD-10-CM

## 2021-11-10 DIAGNOSIS — R030 Elevated blood-pressure reading, without diagnosis of hypertension: Secondary | ICD-10-CM

## 2021-11-10 DIAGNOSIS — M545 Low back pain, unspecified: Secondary | ICD-10-CM | POA: Diagnosis not present

## 2021-11-10 MED ORDER — AMLODIPINE BESYLATE 5 MG PO TABS: 5.0000 mg | ORAL_TABLET | Freq: Every day | ORAL | 3 refills | Status: DC

## 2021-11-10 MED ORDER — METHOCARBAMOL 500 MG PO TABS: 500.0000 mg | ORAL_TABLET | Freq: Three times a day (TID) | ORAL | 0 refills | Status: DC | PRN

## 2021-11-10 MED ORDER — PANTOPRAZOLE SODIUM 40 MG PO TBEC: 40.0000 mg | DELAYED_RELEASE_TABLET | Freq: Every day | ORAL | 3 refills | Status: DC

## 2021-11-10 NOTE — Patient Instructions (Addendum)
Good to see you again today!  BP looks good- let's continue the amlodipine 5 mg For muscle pain in your back- robaxin every 8 hours as needed but not when you need to drive.  If combine with clonazepam take a 1/2 tablet only of clonazepam  Try an otc cortisone on your hand; let me know if this is getting worse or not resolving  I will be in touch with labs Stop by imaging on the ground floor and schedule CT coronary calcium test

## 2021-11-11 ENCOUNTER — Encounter: Payer: Self-pay | Admitting: Family Medicine

## 2021-11-11 LAB — COMPREHENSIVE METABOLIC PANEL
ALT: 13 U/L (ref 0–35)
AST: 14 U/L (ref 0–37)
Albumin: 4.6 g/dL (ref 3.5–5.2)
Alkaline Phosphatase: 75 U/L (ref 39–117)
BUN: 9 mg/dL (ref 6–23)
CO2: 29 mEq/L (ref 19–32)
Calcium: 9.8 mg/dL (ref 8.4–10.5)
Chloride: 102 mEq/L (ref 96–112)
Creatinine, Ser: 0.77 mg/dL (ref 0.40–1.20)
GFR: 90.06 mL/min (ref >60.00)
Glucose, Bld: 92 mg/dL (ref 70–99)
Potassium: 3.8 mEq/L (ref 3.5–5.1)
Sodium: 139 mEq/L (ref 135–145)
Total Bilirubin: 0.4 mg/dL (ref 0.2–1.2)
Total Protein: 7.6 g/dL (ref 6.0–8.3)

## 2021-11-18 ENCOUNTER — Ambulatory Visit (HOSPITAL_BASED_OUTPATIENT_CLINIC_OR_DEPARTMENT_OTHER)
Admission: RE | Admit: 2021-11-18 | Discharge: 2021-11-18 | Disposition: A | Discharge status: Home / Self Care | Source: Ambulatory Visit | Attending: Family Medicine | Admitting: Family Medicine

## 2021-11-18 ENCOUNTER — Encounter: Payer: Self-pay | Admitting: Family Medicine

## 2021-11-18 DIAGNOSIS — R030 Elevated blood-pressure reading, without diagnosis of hypertension: Secondary | ICD-10-CM | POA: Insufficient documentation

## 2021-12-05 MED ORDER — VALACYCLOVIR HCL 1 G PO TABS: ORAL_TABLET | ORAL | 0 refills | Status: DC

## 2021-12-28 ENCOUNTER — Other Ambulatory Visit: Payer: Self-pay | Admitting: Family Medicine

## 2021-12-28 DIAGNOSIS — E782 Mixed hyperlipidemia: Secondary | ICD-10-CM

## 2022-01-12 ENCOUNTER — Encounter: Payer: Self-pay | Admitting: Family Medicine

## 2022-01-12 ENCOUNTER — Other Ambulatory Visit: Payer: Self-pay | Admitting: Family Medicine

## 2022-01-12 DIAGNOSIS — F410 Panic disorder [episodic paroxysmal anxiety] without agoraphobia: Secondary | ICD-10-CM

## 2022-01-12 NOTE — Telephone Encounter (Signed)
Requesting: Klonopin Contract: N/A UDS: N/A Last Visit: 11/10/2021 Next Visit: N/A Last Refill: 11/04/2021  Please Advise

## 2022-01-24 ENCOUNTER — Encounter: Payer: Self-pay | Admitting: Family Medicine

## 2022-02-03 NOTE — Patient Instructions (Addendum)
It was great to see you again today, I will be in touch with your labs soon as possible First dose of shingirx today- 2nd dose can be done in 2-6 months  Recommend covid booster if not done already  Let me know if [employer] will cover Wegovy, Saxenda or Zepbound for weight loss - GLP-1 class drugs If they do cover one of these meds I am glad to rx for you!  Please stop by the imaging dept on the ground floor to schedule your thyroid US to monitor nodules

## 2022-02-03 NOTE — Progress Notes (Addendum)
[facility] [address], [place], [place] [address] [phone]  Date:  02/07/2022   Name:  Margaret Davis   DOB:  1970/09/04   MRN:  [id number]  PCP:  Margaret Mclean, MD    Chief Complaint: Annual Exam (Pt states fasting )   History of Present Illness:  Margaret Davis is a 52 y.o. very pleasant female patient who presents with the following:  Patient seen today for physical exam Most recent visit with myself was in November with concern of elevated blood pressure noted at her oral surgeon's She had been started on amlodipine 5 and responded well.  She is overall doing well- no swelling of her legs  Also history of prediabetes, previous COVID-19 with persistent symptoms, hyperlipidemia, sleep apnea on CPAP, obesity, GERD, vitamin D deficiency She needs [place] to monitor her thyroid nodules   CT coronary calcium done in November, score of 0  Amlodipine 5-blood pressure well-controlled today Simvastatin 20 Clonazepam as needed Flovent Singulair, loratadine  Some blood work on chart from November, CMP, CBC Can update A1c, lipid, thyroid, vitamin D  Pap smear-she is status post partial hysterectomy- has ovaries only Shingrix- she would like to start today Recommend COVID booster Mammogram, colonoscopy up-to-date Flu shot, tetanus up-to-date  She wonders about potentially using a GLP-1 drug for weight loss-she will check with her insurance plan and see which agent they may cover No contra for GLP-1 in personal or family history  Patient notes she has tried many different diet and exercise programs over the years to try and lose weight.  She does use phentermine for several months between 2017 and 2018.  At that time she tolerated well, but she now has diagnosis of hypertension.  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 12/19/2019   Ageusia 12/19/2019   Anosmia 12/19/2019   Olfactory impairment 09/10/2019   Hyperlipidemia 02/14/2019   OSA on CPAP 02/04/2013   Obesity (BMI 30-39.9) 02/04/2013   Syncope    GERD (gastroesophageal reflux disease)    Panic attacks     Past Medical History:  Diagnosis Date   Anxiety    Elevated cholesterol    Essential hypertension 12/25/2019   GERD (gastroesophageal reflux disease)    OCCASIONAL   Mass    RIGHT OVARY  --ABDOMINAL PAIN   Panic attacks    Sleep apnea    Syncope    PT STATES HER HEART AND BRAIN "MISFIRE" IF OVERLY HEATED OR UPSET--AND PT WOULD PASS OUT --NO EPISODES SINCE 2000  - NO MEDICATIONS--PT NO LONGER SEES CARDIOLOGIST    Past Surgical History:  Procedure Laterality Date   ABDOMINAL HYSTERECTOMY  07/2011   partical   BIOPSY THYROID     COLONOSCOPY  04/2019   WISDOM TOOTH EXTRACTION      Social History   Tobacco Use   Smoking status: Never   Smokeless tobacco: Never  Vaping Use   Vaping Use: Never used  Substance Use Topics   Alcohol use: No    Comment: rarely   Drug use: No    Family History  Problem Relation Age of Onset   Diabetes Mother  Colon polyps Mother        benign   Hypertension Father    Colon cancer Neg Hx    Esophageal cancer Neg Hx     Allergies  Allergen Reactions   Orange Oil Hives    Patient reports she "can drink OJ without a problem."   Latex Hives and Other (See Comments)    CONTACT WITH LATEX CAUSES HIVES   Omeprazole Other (See Comments)    Abdominal pain   Other     CAN'T EAT ORANGES-BUT CAN DRINK OJ   Phenobarbital Other (See Comments)    AS ACHILD--THOUGHT TO BE HAVING SEIZURES--HAD ALLERGIC REACTION TO PHENOBARBITAL.   LATER TOLD SHE DID NOT HAVE SEIZURES    Medication list has been reviewed and  updated.  Current Outpatient Medications on File Prior to Visit  Medication Sig Dispense Refill   amLODipine (NORVASC) 5 MG tablet Take 1 tablet (5 mg total) by mouth daily. 90 tablet 3   cholecalciferol (VITAMIN D) 1000 UNITS tablet Take 1,000 Units by mouth daily.     clonazePAM (KLONOPIN) 0.5 MG tablet TAKE 1/2- 1 TABLET BY MOUTH TWICE DAILY AS NEEDED FOR ANXIETY 45 tablet 2   EPINEPHrine (EPIPEN 2-PAK) 0.3 mg/0.3 mL IJ SOAJ injection Inject 0.3 mg into the muscle as needed for anaphylaxis. 1 each prn   FLOVENT HFA 44 MCG/ACT inhaler Inhale 2 puffs into the lungs in the morning and at bedtime. 1 each 12   loratadine (CLARITIN) 10 MG tablet Take 10 mg by mouth daily.     methocarbamol (ROBAXIN) 500 MG tablet Take 1 tablet (500 mg total) by mouth every 8 (eight) hours as needed for muscle spasms. 30 tablet 0   montelukast (SINGULAIR) 10 MG tablet Take 1 tablet (10 mg total) by mouth at bedtime. 30 tablet 3   pantoprazole (PROTONIX) 40 MG tablet Take 1 tablet (40 mg total) by mouth daily. 30 tablet 3   simvastatin (ZOCOR) 20 MG tablet TAKE 1 TABLET(20 MG) BY MOUTH AT BEDTIME 90 tablet 3   valACYclovir (VALTREX) 1000 MG tablet Take 2000 mg once, repeat in 12 hours.  Can re-treat as needed for future cold sore 20 tablet 0   No current facility-administered medications on file prior to visit.    Review of Systems:  As per HPI- otherwise negative.   Physical Examination: Vitals:   02/07/22 0821  BP: 122/80  Pulse: 85  Resp: 18  Temp: 98.6 F (37 C)  SpO2: 99%   Vitals:   02/07/22 0821  Weight: 235 lb 6.4 oz (106.8 kg)  Height: 5' 4"$  (1.626 m)   Body mass index is 40.41 kg/m. Ideal Body Weight: Weight in (lb) to have BMI = 25: 145.3  GEN: no acute distress.  Obese, looks well  HEENT: Atraumatic, Normocephalic.  Bilateral TM wnl, oropharynx normal.  PEERL,EOMI.  Ears and Nose: No external deformity. CV: RRR, No M/G/R. No JVD. No thrill. No extra heart sounds. PULM: CTA B, no  wheezes, crackles, rhonchi. No retractions. No resp. distress. No accessory muscle use. ABD: S, NT, ND. No rebound. No HSM. EXTR: No c/c/e PSYCH: Normally interactive. Conversant.    Assessment and Plan: Physical exam  Essential hypertension  Gastroesophageal reflux disease, unspecified whether esophagitis present  Mixed hyperlipidemia - Plan: Lipid panel  OSA on CPAP  Prediabetes - Plan: Hemoglobin A1c  Vitamin D deficiency - Plan: VITAMIN D 25 Hydroxy (Vit-D Deficiency, Fractures)  Thyroid disorder screening - Plan: TSH, US THYROID  Immunization due -  Plan: Zoster Recombinant (Shingrix )  Hot flashes - Plan: FSH  Class 3 severe obesity with serious comorbidity and body mass index (BMI) of 40.0 to 44.9 in adult, unspecified obesity type [facility])  Physical exam today.  Encouraged healthy diet and exercise routine Will plan further follow- up pending labs. She is interested in a GLP-1 and will check to see if these may be covered-if so I am glad to prescribe Do not advise phentermine due to history of hypertension She has noted possible sx of menopause- s/p hyst send no bleeding in some time.  Will check [place] Gave first dose of shingrix today- next in 2-6 months BP under good control on current regimen   Signed Lamar Blinks, MD  Received labs as below, message to patient  Results for orders placed or performed in visit on 02/07/22  Lipid panel  Result Value Ref Range   Cholesterol 199 0 - 200 mg/dL   Triglycerides 133.0 0.0 - 149.0 mg/dL   HDL 68.00 >39.00 mg/dL   VLDL 26.6 0.0 - 40.0 mg/dL   LDL Cholesterol 104 (H) 0 - 99 mg/dL   Total CHOL/HDL Ratio 3    NonHDL 130.61   TSH  Result Value Ref Range   TSH 2.34 0.35 - 5.50 uIU/mL  VITAMIN D 25 Hydroxy (Vit-D Deficiency, Fractures)  Result Value Ref Range   VITD 42.99 30.00 - 100.00 ng/mL  Hemoglobin A1c  Result Value Ref Range   Hgb A1c MFr Bld 5.9 4.6 - 6.5 %  FSH  Result Value Ref Range   FSH 15.7 mIU/ML

## 2022-02-06 ENCOUNTER — Other Ambulatory Visit: Payer: Self-pay | Admitting: Family Medicine

## 2022-02-06 DIAGNOSIS — K219 Gastro-esophageal reflux disease without esophagitis: Secondary | ICD-10-CM

## 2022-02-07 ENCOUNTER — Encounter: Payer: Self-pay | Admitting: Family Medicine

## 2022-02-07 ENCOUNTER — Ambulatory Visit (INDEPENDENT_AMBULATORY_CARE_PROVIDER_SITE_OTHER): Admitting: Family Medicine

## 2022-02-07 ENCOUNTER — Ambulatory Visit (HOSPITAL_BASED_OUTPATIENT_CLINIC_OR_DEPARTMENT_OTHER)
Admission: RE | Admit: 2022-02-07 | Discharge: 2022-02-07 | Disposition: A | Discharge status: Home / Self Care | Source: Ambulatory Visit | Attending: Family Medicine | Admitting: Family Medicine

## 2022-02-07 VITALS — BP 122/80 | HR 85 | Temp 98.6°F | Resp 18 | Ht 64.0 in | Wt 235.4 lb

## 2022-02-07 DIAGNOSIS — I10 Essential (primary) hypertension: Secondary | ICD-10-CM | POA: Diagnosis not present

## 2022-02-07 DIAGNOSIS — E559 Vitamin D deficiency, unspecified: Secondary | ICD-10-CM

## 2022-02-07 DIAGNOSIS — Z23 Encounter for immunization: Secondary | ICD-10-CM

## 2022-02-07 DIAGNOSIS — Z1329 Encounter for screening for other suspected endocrine disorder: Secondary | ICD-10-CM

## 2022-02-07 DIAGNOSIS — E782 Mixed hyperlipidemia: Secondary | ICD-10-CM

## 2022-02-07 DIAGNOSIS — R7303 Prediabetes: Secondary | ICD-10-CM | POA: Diagnosis not present

## 2022-02-07 DIAGNOSIS — Z6841 Body Mass Index (BMI) 40.0 and over, adult: Secondary | ICD-10-CM

## 2022-02-07 DIAGNOSIS — Z0000 Encounter for general adult medical examination without abnormal findings: Secondary | ICD-10-CM

## 2022-02-07 DIAGNOSIS — K219 Gastro-esophageal reflux disease without esophagitis: Secondary | ICD-10-CM | POA: Diagnosis not present

## 2022-02-07 DIAGNOSIS — R232 Flushing: Secondary | ICD-10-CM | POA: Diagnosis not present

## 2022-02-07 DIAGNOSIS — G4733 Obstructive sleep apnea (adult) (pediatric): Secondary | ICD-10-CM

## 2022-02-07 LAB — VITAMIN D 25 HYDROXY (VIT D DEFICIENCY, FRACTURES): VITD: 42.99 ng/mL (ref 30.00–100.00)

## 2022-02-07 LAB — HEMOGLOBIN A1C: Hgb A1c MFr Bld: 5.9 % (ref 4.6–6.5)

## 2022-02-07 LAB — FOLLICLE STIMULATING HORMONE: FSH: 15.7 m[IU]/mL

## 2022-02-07 LAB — LIPID PANEL
Cholesterol: 199 mg/dL (ref 0–200)
HDL: 68 mg/dL (ref >39.00)
LDL Cholesterol: 104 mg/dL — ABNORMAL HIGH (ref 0–99)
NonHDL: 130.61
Total CHOL/HDL Ratio: 3
Triglycerides: 133 mg/dL (ref 0.0–149.0)
VLDL: 26.6 mg/dL (ref 0.0–40.0)

## 2022-02-07 LAB — TSH: TSH: 2.34 u[IU]/mL (ref 0.35–5.50)

## 2022-02-07 MED ORDER — ZEPBOUND 2.5 MG/0.5ML ~~LOC~~ SOAJ: 2.5000 mg | SUBCUTANEOUS | 2 refills | Status: DC

## 2022-02-08 ENCOUNTER — Telehealth: Payer: Self-pay

## 2022-02-08 NOTE — Telephone Encounter (Signed)
PA initiated via Covermymeds; KEY: [place]. Awaiting determination.

## 2022-02-09 ENCOUNTER — Encounter: Payer: Self-pay | Admitting: Family Medicine

## 2022-02-09 NOTE — Telephone Encounter (Signed)
PA denied. Dr. Lorelei Pont- your office visit note will need the tried and failed dates of phentermine, Qsymia and/or Contrave (or their generic components) and the reason they failed. Once that has been done I can appeal the PA denial for Zepbound.

## 2022-02-22 NOTE — Telephone Encounter (Signed)
Pt signed. Paperwork on [employer]

## 2022-02-23 NOTE — Telephone Encounter (Signed)
Appeal faxed to [employer] at [phone]. Awaiting determination.

## 2022-03-16 ENCOUNTER — Encounter: Payer: Self-pay | Admitting: Family Medicine

## 2022-03-16 NOTE — Telephone Encounter (Signed)
Denial upheld. Unable to overturn the denial because sufficient information has not been provided (OV note from 02/07/22 w/ other wt loss medication fail dates provided was sent with appeal) to establish coverage of the requested medication under [place] benefit. Coverage is provided in situations where the Pt is greater than or equal to 51 years of age, has tried and failed or has contraindication to phetermine, Qsymia, or its individual generic components, and Contrave.

## 2022-03-21 ENCOUNTER — Telehealth: Payer: Self-pay | Admitting: Family Medicine

## 2022-03-21 NOTE — Telephone Encounter (Signed)
[facility] [phone] Implant placements   Rep for [facility] called to make Margaret Davis aware that they faxed a medical clearance form for the patient to Margaret Davis. Fax number was verified.

## 2022-03-22 NOTE — Telephone Encounter (Signed)
Form in folder for review.

## 2022-03-24 ENCOUNTER — Encounter: Payer: Self-pay | Admitting: Family Medicine

## 2022-03-24 NOTE — Telephone Encounter (Signed)
Will fax most recent notes/ labs to Dr [facility]uelah [facility], will provide brief operative clearance letter    BP Readings from Last 3 Encounters:  02/07/22 122/80  11/10/21 124/82  11/04/21 (!) 167/97

## 2022-03-25 ENCOUNTER — Ambulatory Visit: Admitting: Family Medicine

## 2022-03-25 ENCOUNTER — Encounter: Payer: Self-pay | Admitting: Family Medicine

## 2022-03-25 VITALS — BP 125/74 | HR 72 | Temp 98.1°F | Resp 18 | Ht 64.0 in | Wt 236.2 lb

## 2022-03-25 DIAGNOSIS — M5441 Lumbago with sciatica, right side: Secondary | ICD-10-CM | POA: Diagnosis not present

## 2022-03-25 MED ORDER — METHOCARBAMOL 500 MG PO TABS: 500.0000 mg | ORAL_TABLET | Freq: Three times a day (TID) | ORAL | 0 refills | Status: DC | PRN

## 2022-03-25 MED ORDER — MELOXICAM 15 MG PO TABS: 15.0000 mg | ORAL_TABLET | Freq: Every day | ORAL | 0 refills | Status: DC

## 2022-03-25 MED ORDER — PREDNISONE 20 MG PO TABS: 40.0000 mg | ORAL_TABLET | Freq: Every day | ORAL | 0 refills | Status: AC

## 2022-03-25 NOTE — Progress Notes (Signed)
Acute Office Visit  Subjective:     Patient ID: Margaret Davis, female    DOB: 1970-07-28, 52 y.o.   MRN: [id number]  Chief Complaint  Patient presents with   Back Pain    X 2 weeks low back pain radiates down the legs. Tried tylenol, advil, and robaxin, minimal relief.     HPI Patient is in today for back pain.   Right lower back with sciatica Onset: 2 weeks ago (she has struggled with this many years ago - NSAIDs helped last time) Location: right lower back  Duration: constant Characteristics: shooting pain, aching Aggravating factors: bending, lifting, walking, twisting Alleviating factors: hot shower, ibuprofen  Radiating/associated symptoms: shooting pain down right leg to knee Timing: fluctuates with activity Severity: 8/10 currently, this is the best she's been able to get it to the past 2 weeks Denies numbness, tingling, weakness, incontinence      ROS All review of systems negative except what is listed in the HPI      Objective:    BP 125/74   Pulse 72   Temp 98.1 F (36.7 C) (Oral)   Resp 18   Ht 5\' 4"  (1.626 m)   Wt 236 lb 3.2 oz (107.1 kg)   LMP 06/04/2011   SpO2 99%   BMI 40.54 kg/m    Physical Exam Vitals reviewed.  Constitutional:      Appearance: Normal appearance.  Abdominal:     Tenderness: There is no right CVA tenderness or left CVA tenderness.  Musculoskeletal:        General: No swelling.     Comments: Tenderness to palpation of right lumbar paraspinal muscles  Skin:    General: Skin is warm and dry.  Neurological:     General: No focal deficit present.     Mental Status: She is alert and oriented to person, place, and time. Mental status is at baseline.     Motor: No weakness.     Gait: Gait normal.  Psychiatric:        Mood and Affect: Mood normal.        Behavior: Behavior normal.        Thought Content: Thought content normal.        Judgment: Judgment normal.     No results found for any visits on  03/25/22.      Assessment & Plan:   Problem List Items Addressed This Visit   None Visit Diagnoses     Acute midline low back pain with right-sided sciatica    -  Primary   Relevant Medications   predniSONE (DELTASONE) 20 MG tablet   meloxicam (MOBIC) 15 MG tablet   methocarbamol (ROBAXIN) 500 MG tablet      Giving steroid burst, NSAID (start Meloxicam after finishing the prednisone), muscle relaxer as needed  Plan of rest, intermittent application of cold packs (later, may switch to heat, but do not sleep on heating pad) Home exercises discussed. Handout provided.  Proper lifting mechanics with avoidance of heavy lifting discussed.  Recommended Physical Therapy -  will let us know if not improving so we can add referral  Consider XRay studies if not improving after about 4 weeks          Meds ordered this encounter  Medications   predniSONE (DELTASONE) 20 MG tablet    Sig: Take 2 tablets (40 mg total) by mouth daily with breakfast for 5 days.    Dispense:  10 tablet  Refill:  0    Order Specific Question:   Supervising Provider    Answer:   Penni Homans A [[id number]]   meloxicam (MOBIC) 15 MG tablet    Sig: Take 1 tablet (15 mg total) by mouth daily.    Dispense:  30 tablet    Refill:  0    Order Specific Question:   Supervising Provider    Answer:   Penni Homans A [[id number]]   methocarbamol (ROBAXIN) 500 MG tablet    Sig: Take 1 tablet (500 mg total) by mouth every 8 (eight) hours as needed for muscle spasms.    Dispense:  30 tablet    Refill:  0    Order Specific Question:   Supervising Provider    Answer:   Penni Homans A [[id number]]    Return if symptoms worsen or fail to improve.  Terrilyn Saver, NP

## 2022-03-25 NOTE — Patient Instructions (Addendum)
BACK PAIN AVS  For many people, back pain returns. Since low back pain is rarely dangerous, it is often a condition that people can learn to manage on their own.  Remain active. It is stressful on the back to sit or stand in one place. Do not sit, drive, or stand in one place for more than 30 minutes at a time. Take short walks on level surfaces as soon as pain allows. Try to increase the length of time you walk each day.  Do not stay in bed. Resting more than 1 or 2 days can delay your recovery.  Do not avoid exercise or work. Your body is made to move. It is not dangerous to be active, even though your back may hurt. Your back will likely heal faster if you return to being active before your pain is gone.  Pay attention to your body when you  bend and lift. Many people have less discomfort when lifting if they bend their knees, keep the load close to their bodies, and avoid twisting. Often, the most comfortable positions are those that put less stress on your recovering back.  Find a comfortable position to sleep. Use a firm mattress and lie on your side with your knees slightly bent. If you lie on your back, put a pillow under your knees.  Only take over-the-counter or prescription medicines as directed by your caregiver. Over-the-counter medicines to reduce pain and inflammation are often the most helpful. Your caregiver may prescribe muscle relaxant drugs. These medicines help dull your pain so you can more quickly return to your normal activities and healthy exercise.  Put ice on the injured area.  Put ice in a plastic bag.  Place a towel between your skin and the bag.  Leave the ice on for 15 to 20 minutes, 3 to 4 times a day for the first 2 to 3 days. After that, ice and heat may be alternated to reduce pain and spasms.  Ask your caregiver about trying back exercises and gentle massage. This may be of some benefit.  Avoid feeling anxious or stressed. Stress increases muscle tension and can  worsen back pain. It is important to recognize when you are anxious or stressed and learn ways to manage it. Exercise is a great option.  SEEK IMMEDIATE MEDICAL CARE IF:  You have pain that radiates from your back into your legs.  You develop new bowel or bladder control problems.  You have unusual weakness or numbness in your arms or legs.  You develop nausea or vomiting.  You develop abdominal pain.  You feel faint.    BACK PAIN PLAN Giving steroid burst, NSAID (start Meloxicam after finishing the prednisone), muscle relaxer as needed  Plan of rest, intermittent application of cold packs (later, may switch to heat, but do not sleep on heating pad) Home exercises discussed. Handout provided.  Proper lifting mechanics with avoidance of heavy lifting discussed.  Recommended Physical Therapy -  will let us know if not improving so we can add referral  Consider XRay studies if not improving after about 4 weeks

## 2022-04-14 ENCOUNTER — Ambulatory Visit: Admitting: Neurology

## 2022-04-14 ENCOUNTER — Encounter: Payer: Self-pay | Admitting: Neurology

## 2022-04-14 VITALS — BP 146/89 | HR 83 | Ht 64.0 in | Wt 237.4 lb

## 2022-04-14 DIAGNOSIS — G4730 Sleep apnea, unspecified: Secondary | ICD-10-CM

## 2022-04-14 DIAGNOSIS — U099 Post covid-19 condition, unspecified: Secondary | ICD-10-CM

## 2022-04-14 DIAGNOSIS — R7303 Prediabetes: Secondary | ICD-10-CM | POA: Diagnosis not present

## 2022-04-14 DIAGNOSIS — G4733 Obstructive sleep apnea (adult) (pediatric): Secondary | ICD-10-CM | POA: Diagnosis not present

## 2022-04-14 DIAGNOSIS — G4710 Hypersomnia, unspecified: Secondary | ICD-10-CM

## 2022-04-14 DIAGNOSIS — R438 Other disturbances of smell and taste: Secondary | ICD-10-CM

## 2022-04-14 DIAGNOSIS — Z6841 Body Mass Index (BMI) 40.0 and over, adult: Secondary | ICD-10-CM

## 2022-04-14 NOTE — Progress Notes (Signed)
Provider:  Melvyn Novas, MD  Primary Care Physician:  Pearline Cables, MD [address] [place] [address]     Referring Provider: Pearline Cables, Md [address] [place],  [place] [address]          Chief Complaint according to patient   Patient presents with:     New Patient (Initial Visit)           HISTORY OF PRESENT ILLNESS:  Margaret Davis is a 52 y.o. 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  She has a smell kit for use at home , reports no improvement.   Chief concern according to patient :  I have lost my mother in law on 10-07-2021 . Right after my visit with Shawnie Dapper, NP.  Anxiety resulted when she drove here to our building today.  She has high BP.   Dr Patsy Lager is treating her BP, which is not well controlled, she has not yet had another visit with Dr Melvenia Needles.  We are discussing the effect of CPAP on OSA and On HTN.   She is frustrated with her inability to lose weight.   Weight is main OSA risk factor . She struggles with weight, her insurance did not want to cover Ozempic but pushed phentermine , and she cannot use this drug now with her BP readings.  She has prediabetes, HBA1c at 5.9.    We reviewed her CPAP date, she has been compliant uses CPAP for 80% of days and 65% of hours, average is 5.5 hours,  she had to take a break when she had Bronchitis. She likes her CPAP.  AHI is 0.8/h  Leak of air 0 litres.  Nasal pillow interface                            Editor: Berneice Gandy, RMA (Registered Engineer, site)                       Progress Notes by Shawnie Dapper, NP at 10/07/2021 2:30 PM  Author:       Note Status:       Editor:              Expand All Collapse All     10/07/21 ALL:  Margaret Davis is a 52 y.o. 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 She reports having more difficulty with compliance over the past few months. She contributes this to being more busy at work. 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 She is only sleeping 4-5 hours a night. She usually goes to bed around 12-1am and wakes at 5am. She admits that she falls asleep prior to starting therapy. She had a complication with her machine back in May and had to be without therapy while machine was being fixed. She does note benefit of using therapy. Some days she is able to smell odors and others she is not. Little taste. She did not participate in [facility] smell school.       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 She is tearful as she reports her ageusia and anosmia sensory deficits.  She presented today for the first time since her sleep study, HST and start of CPAP therapy. Her home sleep test study date 12-09-2019 resulted in a total sleep time of 7 hours 10 minutes with 80% REM sleep, AHI was 17 which is a milder form of sleep apnea but during REM sleep AHI became 35.2 so this was not REM dependent form of sleep apnea requiring CPAP therapy.  He did not have prolonged hypoxia episodes and as far as I can see there was no positional component noted.  I ordered an auto titration device with a setting for pressure between 5 and 15 cmH2O to centimeter EPR and a mask of her choice with heated humidification, the patient has used her CPAP 31 out of 36 days or a compliance of 87% average user time 6 hours 27 minutes, 95th percentile pressure is 7.3 cmH2O, average air leak is 4.1 L/min, and her residual AHI was 0.6/h this is a very good resolution of sleep apnea. She feels more energy at work, but feels  able to sleep well. She has trazodone available for those nights.      I repeated a smell test today- 10-07-2020 Cloves- did not identify it, but smelled something.  Lemon identified. Coconut identified. Almond - not smell identified, no taste sensation.  Lavender identified. Peppermint identified.  Grapefruit identified as CITRUS.    Marikay AlarCheryl L Shepperson is a 52 y.o. female and seen here upon a referral from Dr. Patsy Lageropland for a neuro consultation-  11-19-2019;   Chief concern according to patient :  I lost smell and taste after contracting COVID 19 in 2020, Dec 31 st. No hospitalization, just no energy, strength, all 4 family members were infected, sinus headaches. Only Mrs. Roxan Davis had pneumonia, was SOB.    ICheryl L Roxan Davis is a right -handed Black or [place] female with a known OSA  sleep disorder. She  has a past medical history of Anxiety, Elevated cholesterol, Essential hypertension (12/25/2019), GERD (gastroesophageal reflux disease), Mass, Panic attacks, Sleep apnea, and Syncope. Her CPAP machine broke about 24 month ago, before the pandemic , but she feels she could sleep better if treated.  The patient had the first sleep study in the year 2010  with a result of OSA- done @ the former [facility].   Sleep relevant medical history: Dr. Rosemary HolmsPatwardhan has seen her for syncope.               Review of Systems: Out of a complete 14 system review, the patient complains of only the following symptoms, and all other reviewed systems are negative.:  Fatigue, sleepiness , snoring, fragmented sleep, Insomnia,  no dizziness, no RLS, no morning headaches.    How likely are you to doze in the following situations: 0 = not likely, 1 = slight chance, 2 = moderate chance, 3 = high chance   Sitting and Reading? Watching Television? Sitting inactive in a public place (theater or meeting)? As a passenger in a car for an hour without a break? Lying down in the  afternoon when circumstances permit? Sitting and talking to someone? Sitting quietly after lunch without alcohol? In a car, while stopped for a few minutes in traffic?   Total = 11/ 24 points   FSS endorsed at 11/  63 points.   Social History   Socioeconomic History   Marital status: Married    Spouse name: Not on file   Number of children: 2   Years of education: Not on file   Highest education level: Not on file  Occupational History   Not on file  Tobacco Use   Smoking status: Never   Smokeless tobacco: Never  Vaping Use   Vaping Use: Never used  Substance and Sexual Activity   Alcohol use: No    Comment: rarely   Drug use: No   Sexual activity: Yes    Birth control/protection: Surgical  Other Topics Concern   Not on file  Social History Narrative   ** Merged History Encounter **       Social Determinants of Health   Financial Resource Strain: Not on file  Food Insecurity: Not on file  Transportation Needs: Not on file  Physical Activity: Not on file  Stress: Not on file  Social Connections: Not on file    Family History  Problem Relation Age of Onset   Diabetes Mother    Colon polyps Mother        benign   Hypertension Father    Colon cancer Neg Hx    Esophageal cancer Neg Hx     Past Medical History:  Diagnosis Date   Anxiety    Elevated cholesterol    Essential hypertension 12/25/2019   GERD (gastroesophageal reflux disease)    OCCASIONAL   Mass    RIGHT OVARY  --ABDOMINAL PAIN   Panic attacks    Sleep apnea    Syncope    PT STATES HER HEART AND BRAIN "MISFIRE" IF OVERLY HEATED OR UPSET--AND PT WOULD PASS OUT --NO EPISODES SINCE 2000  - NO MEDICATIONS--PT NO LONGER SEES CARDIOLOGIST    Past Surgical History:  Procedure Laterality Date   ABDOMINAL HYSTERECTOMY  07/2011   partical   BIOPSY THYROID     COLONOSCOPY  04/2019   WISDOM TOOTH EXTRACTION       Current Outpatient Medications on File Prior to Visit  Medication Sig Dispense  Refill   amLODipine (NORVASC) 5 MG tablet Take 1 tablet (5 mg total) by mouth daily. 90 tablet 3   cholecalciferol (VITAMIN D) 1000 UNITS tablet Take 1,000 Units by mouth daily.     clonazePAM (KLONOPIN) 0.5 MG tablet TAKE 1/2- 1 TABLET BY MOUTH TWICE DAILY AS NEEDED FOR ANXIETY 45 tablet 2   EPINEPHrine (EPIPEN 2-PAK) 0.3 mg/0.3 mL IJ SOAJ injection Inject 0.3 mg into the muscle as needed for anaphylaxis. 1 each prn   FLOVENT HFA 44 MCG/ACT inhaler Inhale 2 puffs into the lungs in the morning and at bedtime. 1 each 12   loratadine (CLARITIN) 10 MG tablet Take 10 mg by mouth daily.     meloxicam (MOBIC) 15 MG tablet Take 1 tablet (15 mg total) by mouth daily. 30 tablet 0   methocarbamol (ROBAXIN) 500 MG tablet Take 1 tablet (500 mg total) by mouth every 8 (eight) hours as needed for muscle spasms. 30 tablet 0   montelukast (SINGULAIR) 10 MG tablet Take 1 tablet (10 mg total) by mouth at bedtime. 30 tablet 3   pantoprazole (PROTONIX) 40 MG tablet TAKE 1 TABLET(40 MG) BY MOUTH DAILY 90 tablet 3   simvastatin (ZOCOR) 20 MG tablet TAKE 1 TABLET(20 MG) BY MOUTH AT BEDTIME 90 tablet 3   tirzepatide (ZEPBOUND) 2.5 MG/0.5ML Pen Inject 2.5 mg into the skin once a week. 2  mL 2   valACYclovir (VALTREX) 1000 MG tablet Take 2000 mg once, repeat in 12 hours.  Can re-treat as needed for future cold sore 20 tablet 0   No current facility-administered medications on file prior to visit.    Allergies  Allergen Reactions   Orange Oil Hives    Patient reports she "can drink OJ without a problem."   Latex Hives and Other (See Comments)    CONTACT WITH LATEX CAUSES HIVES   Omeprazole Other (See Comments)    Abdominal pain   Other     CAN'T EAT ORANGES-BUT CAN DRINK OJ   Phenobarbital Other (See Comments)    AS ACHILD--THOUGHT TO BE HAVING SEIZURES--HAD ALLERGIC REACTION TO PHENOBARBITAL.   LATER TOLD SHE DID NOT HAVE SEIZURES     DIAGNOSTIC DATA (LABS, IMAGING, TESTING) - I reviewed patient records, labs,  notes, testing and imaging myself where available.  Lab Results  Component Value Date   WBC 6.3 11/04/2021   HGB 14.2 11/04/2021   HCT 42.2 11/04/2021   MCV 90.2 11/04/2021   PLT 353 11/04/2021      Component Value Date/Time   NA 139 11/10/2021 1527   K 3.8 11/10/2021 1527   CL 102 11/10/2021 1527   CO2 29 11/10/2021 1527   GLUCOSE 92 11/10/2021 1527   BUN 9 11/10/2021 1527   CREATININE 0.77 11/10/2021 1527   CALCIUM 9.8 11/10/2021 1527   PROT 7.6 11/10/2021 1527   ALBUMIN 4.6 11/10/2021 1527   AST 14 11/10/2021 1527   ALT 13 11/10/2021 1527   ALKPHOS 75 11/10/2021 1527   BILITOT 0.4 11/10/2021 1527   GFRNONAA >60 11/04/2021 1729   GFRAA >90 09/06/2012 1225   Lab Results  Component Value Date   CHOL 199 02/07/2022   HDL 68.00 02/07/2022   LDLCALC 104 (H) 02/07/2022   TRIG 133.0 02/07/2022   CHOLHDL 3 02/07/2022   Lab Results  Component Value Date   HGBA1C 5.9 02/07/2022   No results found for: "VITAMINB12" Lab Results  Component Value Date   TSH 2.34 02/07/2022    PHYSICAL EXAM:  Today's Vitals   04/14/22 1437  BP: (!) 146/89  Pulse: 83  Weight: 237 lb 6.4 oz (107.7 kg)  Height: 5\' 4"  (1.626 m)   Body mass index is 40.75 kg/m.   Wt Readings from Last 3 Encounters:  04/14/22 237 lb 6.4 oz (107.7 kg)  03/25/22 236 lb 3.2 oz (107.1 kg)  02/07/22 235 lb 6.4 oz (106.8 kg)     Ht Readings from Last 3 Encounters:  04/14/22 5\' 4"  (1.626 m)  03/25/22 5\' 4"  (1.626 m)  02/07/22 5\' 4"  (1.626 m)      General: The patient is awake, alert and appears not in acute distress. The patient is well groomed. Head: Normocephalic, atraumatic. Neck is supple. Mallampati; 3 plus,  neck circumference:16 inches . Nasal airflow patent.  Retrognathia is not seen.  Dental status: intact  Cardiovascular:  Regular rate and cardiac rhythm by pulse,  without distended neck veins. Respiratory: Lungs are clear to auscultation.  Skin:  Without evidence of ankle edema, or  rash. Trunk: The patient's posture is erect.   Neurologic exam : The patient is awake and alert, oriented to place and time.   Memory subjective described as intact.  Attention span & concentration ability appears normal.  Speech is fluent, without  dysarthria, dysphonia or aphasia.  Mood and affect are appropriate.   Cranial nerves:  loss of smell or taste  reported - fully vaccinated  Pupils are equal and briskly reactive to light. Funduscopic exam deferred.  Extraocular movements in vertical and horizontal planes were intact and without nystagmus. No Diplopia. Visual fields by finger perimetry are intact. Hearing was intact to soft voice and finger rubbing. Facial sensation intact to fine touch. Facial motor strength is symmetric and tongue and uvula move midline. Neck ROM : rotation, tilt and flexion extension were normal for age and shoulder shrug was symmetrical.  Motor exam:  Symmetric bulk, tone and ROM.   Normal tone without cog wheeling, symmetric grip strength . Sensory:  Fine touch, pinprick and vibration were tested  and  normal.  Proprioception tested in the upper extremities was normal. Coordination: Rapid alternating movements in the fingers/hands were of normal speed.  The Finger-to-nose maneuver was intact without evidence of ataxia, dysmetria or tremor. Gait and station: Patient could rise unassisted from a seated position, walked without assistive device.  Stance is of normal width/ base - turned with 3 steps.  Toe and heel walk were deferred.  Deep tendon reflexes: in the upper and lower extremities are symmetric and intact.  Babinski response was deferred.     ASSESSMENT AND PLAN 52 y.o. year old female  here with:    1) OSA on CPAP  2) weight / BMI is her main risk factor , she has struggled to loose weight, has HTN, and prediabetes.   3) ageusia and anosmia persistent , post COVID long hauler.     I plan to follow up  through our NP within 12 months.    I would like to thank Copland, Gwenlyn Found, MD and Copland, Gwenlyn Found, Md [address] [place],  [place] [address] for allowing me to meet with and to take care of this pleasant patient.   CC: I will share my notes with Dr Melvenia Needles .  After spending a total time of  28  minutes face to face and additional time for physical and neurologic examination, review of laboratory studies,  personal review of imaging studies, reports and results of other testing and review of referral information / records as far as provided in visit,   Electronically signed by: Melvyn Novas, MD 04/14/2022 3:04 PM  [facility] Board certified by The [employer] of Sleep Medicine and Diplomate of the [employer] of Sleep Medicine. Board certified In Neurology through the ABPN, Fellow of the [employer] of Neu[employer]. Medical Director of [facility].

## 2022-04-19 ENCOUNTER — Encounter: Payer: Self-pay | Admitting: Family Medicine

## 2022-04-21 ENCOUNTER — Other Ambulatory Visit: Payer: Self-pay | Admitting: Family Medicine

## 2022-04-21 DIAGNOSIS — M5441 Lumbago with sciatica, right side: Secondary | ICD-10-CM

## 2022-06-07 ENCOUNTER — Encounter: Payer: Self-pay | Admitting: Family Medicine

## 2022-06-07 ENCOUNTER — Other Ambulatory Visit: Payer: Self-pay | Admitting: Family Medicine

## 2022-06-07 DIAGNOSIS — F410 Panic disorder [episodic paroxysmal anxiety] without agoraphobia: Secondary | ICD-10-CM

## 2022-07-12 ENCOUNTER — Encounter: Payer: Self-pay | Admitting: Family Medicine

## 2022-07-12 LAB — HM MAMMOGRAPHY

## 2022-07-16 NOTE — Patient Instructions (Addendum)
It was good to see you again today!    I will be in touch with your x-rays asap 2nd dose shingles today Wait about 48 hours to start on the prednisone so the shingles vaccine can take effect  For BP- stop amlodipine, start losartan 25 mg daily.  If you can, please track your BP at home and let me know how it is working  For anxiety- fluoxetine 20 mg daily.  We can go up to 40 mg after a few weeks if needed, please keep me posted   Plan for physical exam and labs in November

## 2022-07-16 NOTE — Progress Notes (Signed)
[facility] [address], [address] [place], [place] [address] [phone]  Date:  07/25/2022   Name:  Margaret Davis   DOB:  1970/05/15   MRN:  [id number]  PCP:  Pearline Cables, MD    Chief Complaint: Sciatic Nerve Issues (Pt was seen back in March by TB for sciatica. Pain has worsened. Tylenol ane Meloxicam does not help at all. /Concerns/ questions: 1. Weight- [employer] declined Zepbound. 3. Anxiety 4. Dentist asked her to mention BP meds and them causing her gums to sell. 5. Fatty liver. (Daughter has Hives, should she be seen?)/Shingrix #2 due)   History of Present Illness:  Margaret Davis is a 52 y.o. very pleasant female patient who presents with the following:  Patient seen today with concern about possible sciatic nerve pain and a few other things Most recent visit with myself was in February of this year for her physical We also started treating her for hypertension last year with amlodipine; in addition history of prediabetes, previous COVID-19 with persistent symptoms, hyperlipidemia, sleep apnea on CPAP, obesity, GERD, vitamin D deficiency   CT coronary calcium November 2023, score of 0  Status post hysterectomy Shingrix- give 2nd dose today   Most recent thyroid ultrasound in February of this year IMPRESSION: 1. No significant interval change in the size or appearance of previously biopsied nodules in the right mid and right lower gland. 2. Changing morphology of small nodule in the left lower gland which remains stable in size. The nodule has become more isoechoic when compared to prior images and is now consistent with TI-RADS category 3. This lesion no longer meets criteria for continued imaging surveillance.  Lab Results  Component Value Date   HGBA1C 5.9 02/07/2022   She has noted sciatica type pain since September of last year-actually, looking back she has had sciatica on occasion since she was  pregnant with her first child who is starting college this fall!   Currently, the pain will come and go She tried meloxicam,prednisone- did help temporarily.   It she bends forward she will eventually feel the symptoms running down the back of both legs; equal both sodes  The pain seemed to start in her lower back but is now more in the buttocks and hamstrings She did have some back films and did some PT years ago but not recent   Sx can be present with any position Better if she keeps moving  No leg numbness or weakness, no bowel or bladder control issues, no saddle anesthesia  Her oldest is starting college next month-this is of course a good thing but is also stressful!  Margaret Davis is having more anxiety - she had a bad panic attack 2 weeks ago  She is using clonazepam as needed - has needed a bit more recently, she would be interested in a daily preventative medication so she can minimize use of controlled for habit-forming medications  No suicidal ideation, no severe depression symptoms  Patient Active Problem List   Diagnosis Date Noted   Prediabetes 12/08/2020   Essential hypertension 12/25/2019   Dyspnea on exertion 12/25/2019   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 12/19/2019   Ageusia 12/19/2019   Anosmia 12/19/2019   Olfactory impairment 09/10/2019   Hyperlipidemia 02/14/2019   OSA on CPAP 02/04/2013   Obesity (BMI 30-39.9) 02/04/2013   Syncope    GERD (gastroesophageal reflux disease)  Panic attacks     Past Medical History:  Diagnosis Date   Anxiety    Elevated cholesterol    Essential hypertension 12/25/2019   GERD (gastroesophageal reflux disease)    OCCASIONAL   Mass    RIGHT OVARY  --ABDOMINAL PAIN   Panic attacks    Sleep apnea    Syncope    PT STATES HER HEART AND BRAIN "MISFIRE" IF OVERLY HEATED OR UPSET--AND PT WOULD PASS OUT --NO EPISODES SINCE 2000  - NO MEDICATIONS--PT NO LONGER SEES CARDIOLOGIST    Past Surgical  History:  Procedure Laterality Date   ABDOMINAL HYSTERECTOMY  07/2011   partical   BIOPSY THYROID     COLONOSCOPY  04/2019   WISDOM TOOTH EXTRACTION      Social History   Tobacco Use   Smoking status: Never   Smokeless tobacco: Never  Vaping Use   Vaping status: Never Used  Substance Use Topics   Alcohol use: No    Comment: rarely   Drug use: No    Family History  Problem Relation Age of Onset   Diabetes Mother    Colon polyps Mother        benign   Hypertension Father    Colon cancer Neg Hx    Esophageal cancer Neg Hx     Allergies  Allergen Reactions   Orange Oil Hives    Patient reports she "can drink OJ without a problem."   Latex Hives and Other (See Comments)    CONTACT WITH LATEX CAUSES HIVES   Omeprazole Other (See Comments)    Abdominal pain   Other     CAN'T EAT ORANGES-BUT CAN DRINK OJ   Phenobarbital Other (See Comments)    AS ACHILD--THOUGHT TO BE HAVING SEIZURES--HAD ALLERGIC REACTION TO PHENOBARBITAL.   LATER TOLD SHE DID NOT HAVE SEIZURES    Medication list has been reviewed and updated.  Current Outpatient Medications on File Prior to Visit  Medication Sig Dispense Refill   amLODipine (NORVASC) 5 MG tablet Take 1 tablet (5 mg total) by mouth daily. 90 tablet 3   cholecalciferol (VITAMIN D) 1000 UNITS tablet Take 1,000 Units by mouth daily.     clonazePAM (KLONOPIN) 0.5 MG tablet TAKE 1/2 TO 1 TABLET BY MOUTH TWICE DAILY AS NEEDED FOR ANXIETY 45 tablet 1   EPINEPHrine (EPIPEN 2-PAK) 0.3 mg/0.3 mL IJ SOAJ injection Inject 0.3 mg into the muscle as needed for anaphylaxis. 1 each prn   FLOVENT HFA 44 MCG/ACT inhaler Inhale 2 puffs into the lungs in the morning and at bedtime. 1 each 12   loratadine (CLARITIN) 10 MG tablet Take 10 mg by mouth daily.     meloxicam (MOBIC) 15 MG tablet TAKE 1 TABLET(15 MG) BY MOUTH DAILY 30 tablet 0   pantoprazole (PROTONIX) 40 MG tablet TAKE 1 TABLET(40 MG) BY MOUTH DAILY 90 tablet 3   simvastatin (ZOCOR) 20 MG  tablet TAKE 1 TABLET(20 MG) BY MOUTH AT BEDTIME 90 tablet 3   No current facility-administered medications on file prior to visit.    Review of Systems:  As per HPI- otherwise negative.   Physical Examination: Vitals:   07/25/22 0948  BP: 118/62  Pulse: 72  Resp: 18  Temp: 98 F (36.7 C)  SpO2: 98%   Vitals:   07/25/22 0948  Weight: 237 lb 6.4 oz (107.7 kg)  Height: 5\' 4"  (1.626 m)   Body mass index is 40.75 kg/m. Ideal Body Weight: Weight in (lb) to have BMI =  25: 145.3  GEN: no acute distress.  Obese, looks well  HEENT: Atraumatic, Normocephalic.  Ears and Nose: No external deformity. CV: RRR, No M/G/R. No JVD. No thrill. No extra heart sounds. PULM: CTA B, no wheezes, crackles, rhonchi. No retractions. No resp. distress. No accessory muscle use. ABD: S, NT, ND, +BS. No rebound. No HSM. EXTR: No c/c/e PSYCH: Normally interactive. Conversant.  There is some tenderness to palpation over the sciatic notches and SI joints bilaterally.  Tight hamstrings, forward flexion is limited.  Thoracolumbar extension is also reduced from normal. Normal bilateral lower extremity strength, sensation, deep tendon reflex.  She does have some discomfort with straight leg raise bilaterally  Assessment and Plan: Immunization due - Plan: Zoster Recombinant (Shingrix )  Acute bilateral low back pain with bilateral sciatica - Plan: DG Lumbar Spine Complete, predniSONE (DELTASONE) 20 MG tablet  Morbid obesity (HCC)  GAD (generalized anxiety disorder) - Plan: FLUoxetine (PROZAC) 20 MG capsule  Essential hypertension - Plan: losartan (COZAAR) 25 MG tablet  Patient seen today with a few concerns.  Gave second dose of Shingrix She has noted sciatica off and on for almost 20 years, exacerbated recently.  No recent lumbar spine films.  Will obtain lumbar films, gave a course of prednisone.  Will follow-up with her pending her x-ray results Discussed her weight, unfortunately her insurance will  not cover GLP-1.  I advised her that obesity may also contribute to worsening back pain over the years.  We talked a bit weight loss surgery-however, a close friend of hers actually died from complications following gastric bypass- of course she is apprehensive  Her dentist noted some gum hypertrophy, will stop amlodipine and change to losartan-start 25.  She can monitor her blood pressure at home and will let me know how it looks, can adjust as needed  Discussed medications for anxiety, will add fluoxetine 20.  Can increase to 40 mg in a few weeks if needed.  Discussed most common SE  Signed Abbe Amsterdam, MD

## 2022-07-25 ENCOUNTER — Ambulatory Visit: Admitting: Family Medicine

## 2022-07-25 ENCOUNTER — Ambulatory Visit (HOSPITAL_BASED_OUTPATIENT_CLINIC_OR_DEPARTMENT_OTHER)
Admission: RE | Admit: 2022-07-25 | Discharge: 2022-07-25 | Disposition: A | Discharge status: Home / Self Care | Source: Ambulatory Visit | Attending: Family Medicine | Admitting: Family Medicine

## 2022-07-25 VITALS — BP 118/62 | HR 72 | Temp 98.0°F | Resp 18 | Ht 64.0 in | Wt 237.4 lb

## 2022-07-25 DIAGNOSIS — I10 Essential (primary) hypertension: Secondary | ICD-10-CM

## 2022-07-25 DIAGNOSIS — M5441 Lumbago with sciatica, right side: Secondary | ICD-10-CM

## 2022-07-25 DIAGNOSIS — M5442 Lumbago with sciatica, left side: Secondary | ICD-10-CM | POA: Diagnosis not present

## 2022-07-25 DIAGNOSIS — F411 Generalized anxiety disorder: Secondary | ICD-10-CM

## 2022-07-25 DIAGNOSIS — Z23 Encounter for immunization: Secondary | ICD-10-CM | POA: Diagnosis not present

## 2022-07-25 DIAGNOSIS — Z6841 Body Mass Index (BMI) 40.0 and over, adult: Secondary | ICD-10-CM | POA: Diagnosis not present

## 2022-07-25 MED ORDER — LOSARTAN POTASSIUM 25 MG PO TABS: 25.0000 mg | ORAL_TABLET | Freq: Every day | ORAL | 1 refills | Status: DC

## 2022-07-25 MED ORDER — PREDNISONE 20 MG PO TABS: ORAL_TABLET | ORAL | 0 refills | Status: DC

## 2022-07-25 MED ORDER — FLUOXETINE HCL 20 MG PO CAPS: 20.0000 mg | ORAL_CAPSULE | Freq: Every day | ORAL | 3 refills | Status: DC

## 2022-07-26 ENCOUNTER — Encounter: Payer: Self-pay | Admitting: Family Medicine

## 2022-07-30 ENCOUNTER — Encounter: Payer: Self-pay | Admitting: Family Medicine

## 2022-07-31 ENCOUNTER — Encounter: Payer: Self-pay | Admitting: Family Medicine

## 2022-07-31 DIAGNOSIS — M545 Low back pain, unspecified: Secondary | ICD-10-CM

## 2022-07-31 DIAGNOSIS — R109 Unspecified abdominal pain: Secondary | ICD-10-CM

## 2022-08-02 ENCOUNTER — Ambulatory Visit (HOSPITAL_BASED_OUTPATIENT_CLINIC_OR_DEPARTMENT_OTHER)
Admission: RE | Admit: 2022-08-02 | Discharge: 2022-08-02 | Disposition: A | Discharge status: Home / Self Care | Source: Ambulatory Visit | Attending: Family Medicine | Admitting: Family Medicine

## 2022-08-02 DIAGNOSIS — R109 Unspecified abdominal pain: Secondary | ICD-10-CM | POA: Insufficient documentation

## 2022-08-03 ENCOUNTER — Encounter: Payer: Self-pay | Admitting: Family Medicine

## 2022-08-03 DIAGNOSIS — K760 Fatty (change of) liver, not elsewhere classified: Secondary | ICD-10-CM

## 2022-08-04 ENCOUNTER — Encounter (INDEPENDENT_AMBULATORY_CARE_PROVIDER_SITE_OTHER): Payer: Self-pay

## 2022-08-04 ENCOUNTER — Encounter: Payer: Self-pay | Admitting: Physical Medicine and Rehabilitation

## 2022-08-04 ENCOUNTER — Ambulatory Visit (INDEPENDENT_AMBULATORY_CARE_PROVIDER_SITE_OTHER): Admitting: Physical Medicine and Rehabilitation

## 2022-08-04 DIAGNOSIS — M5441 Lumbago with sciatica, right side: Secondary | ICD-10-CM | POA: Diagnosis not present

## 2022-08-04 DIAGNOSIS — M5442 Lumbago with sciatica, left side: Secondary | ICD-10-CM | POA: Diagnosis not present

## 2022-08-04 DIAGNOSIS — M7918 Myalgia, other site: Secondary | ICD-10-CM

## 2022-08-04 DIAGNOSIS — G8929 Other chronic pain: Secondary | ICD-10-CM

## 2022-08-04 DIAGNOSIS — M533 Sacrococcygeal disorders, not elsewhere classified: Secondary | ICD-10-CM

## 2022-08-04 MED ORDER — DIAZEPAM 5 MG PO TABS: ORAL_TABLET | ORAL | 0 refills | Status: DC

## 2022-08-04 NOTE — Progress Notes (Signed)
Margaret Davis - 52 y.o. female MRN [id number]  Date of birth: 06-01-1970  Office Visit Note: Visit Date: 08/04/2022 PCP: Pearline Cables, MD Referred by: Pearline Cables, MD  Subjective: Chief Complaint  Patient presents with   Lower Back - Pain   HPI: Margaret Davis is a 52 y.o. female who comes in today per the request of Dr. Warner Mccreedy for evaluation of chronic, worsening and severe bilateral lower back pain radiating to buttocks and down posterior legs to calves, pain ongoing intermittently since September, has remained constant since March. No aggravating factors, she describes pain as tingling, sore and aching sensation, currently rates as 5 out of 10. Some relief of pain with home exercise, rest and use of medications. Minimal relief of pain with oral Prednisone. Recent lumbar x rays exhibits mild degenerative changes and symmetric sclerosis of the bilateral sacroiliac joints, question possible erosive changes of the sacroiliac joints versus summation artifact. Recent CT of abdomen and pelvis shows bilateral sacroiliitis. Patient is managed from orthopedic standpoint by Dr. Toni Arthurs with [facility]. Patient denies focal weakness. No recent trauma or falls.     Review of Systems  Musculoskeletal:  Positive for back pain, joint pain and myalgias.  Neurological:  Negative for tingling, sensory change, focal weakness and weakness.  All other systems reviewed and are negative.  Otherwise per HPI.  Assessment & Plan: Visit Diagnoses:    ICD-10-CM   1. Sacroiliac joint dysfunction  M53.3 Ambulatory referral to Physical Medicine Rehab    2. Chronic bilateral low back pain with bilateral sciatica  M54.42 Ambulatory referral to Physical Medicine Rehab   M54.41    G89.29     3. Chronic buttock pain  M79.18 Ambulatory referral to Physical Medicine Rehab   G89.29        Plan: Findings:  Chronic, worsening and severe bilateral lower back pain radiating to  buttocks and down posterior legs to calves. Patient continues to have severe discomfort despite good conservative therapies such as home exercise regimen, rest and use of medications. Patients clinical presentation and exam are consistent with sacroiliitis. Next step is to perform diagnostic and hopefully therapeutic bilateral sacroiliac joint injections under fluoroscopic guidance. I discussed injection procedure with her today in detail. She voiced issues with anxiety related to injections, I did prescribe pre-procedure Valium for her. I also discussed obtaining rheumatological labs however she continues to take Prednisone and there is concern test results would be altered. We will hold on lab work for now. If we feel her symptoms are more inflammatory mediated we would consider referral to Dr. Sheliah Hatch with rheumatology. We would also consider obtaining lumbar MRI imaging if her symptoms seem more radicular in nature. No red flag symptoms noted upon exam today.         Meds & Orders:  Meds ordered this encounter  Medications   diazepam (VALIUM) 5 MG tablet    Sig: Take one tablet by mouth with food one hour prior to procedure. May repeat 30 minutes prior if needed.    Dispense:  2 tablet    Refill:  0    Orders Placed This Encounter  Procedures   Ambulatory referral to Physical Medicine Rehab    Follow-up: Return for Bilateral sacroiliac joint injections.   Procedures: No procedures performed      Clinical History: No specialty comments available.   She reports that she has never smoked. She has never used smokeless tobacco.  Recent Labs  02/07/22 0846  HGBA1C 5.9    Objective:  VS:  HT:    WT:   BMI:     BP:   HR: bpm  TEMP: ( )  RESP:  Physical Exam Vitals and nursing note reviewed.  HENT:     Head: Normocephalic and atraumatic.     Right Ear: External ear normal.     Left Ear: External ear normal.     Nose: Nose normal.     Mouth/Throat:     Mouth:  Mucous membranes are moist.  Eyes:     Extraocular Movements: Extraocular movements intact.  Cardiovascular:     Rate and Rhythm: Normal rate.     Pulses: Normal pulses.  Pulmonary:     Effort: Pulmonary effort is normal.  Abdominal:     General: Abdomen is flat. There is no distension.  Musculoskeletal:        General: Tenderness present.     Cervical back: Normal range of motion.     Comments: Patient rises from seated position to standing without difficulty. Good lumbar range of motion. No pain noted with facet loading. 5/5 strength noted with bilateral hip flexion, knee flexion/extension, ankle dorsiflexion/plantarflexion and EHL. No clonus noted bilaterally. No pain upon palpation of greater trochanters. No pain with internal/external rotation of bilateral hips. Sensation intact bilaterally. Negative slump test bilaterally. Ambulates without aid, gait steady.  Positive Fortin finger, Gaenslen's and FABER testing bilaterally.     Skin:    General: Skin is warm and dry.     Capillary Refill: Capillary refill takes less than 2 seconds.  Neurological:     General: No focal deficit present.     Mental Status: She is alert and oriented to person, place, and time.  Psychiatric:        Mood and Affect: Mood normal.        Behavior: Behavior normal.     Ortho Exam  Imaging: No results found.  Past Medical/Family/Surgical/Social History: Medications & Allergies reviewed per EMR, new medications updated. Patient Active Problem List   Diagnosis Date Noted   Prediabetes 12/08/2020   Essential hypertension 12/25/2019   Dyspnea on exertion 12/25/2019   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 12/19/2019   Ageusia 12/19/2019   Anosmia 12/19/2019   Olfactory impairment 09/10/2019   Hyperlipidemia 02/14/2019   OSA on CPAP 02/04/2013   Obesity (BMI 30-39.9) 02/04/2013   Syncope    GERD (gastroesophageal reflux disease)    Panic attacks    Past Medical  History:  Diagnosis Date   Anxiety    Elevated cholesterol    Essential hypertension 12/25/2019   GERD (gastroesophageal reflux disease)    OCCASIONAL   Mass    RIGHT OVARY  --ABDOMINAL PAIN   Panic attacks    Sleep apnea    Syncope    PT STATES HER HEART AND BRAIN "MISFIRE" IF OVERLY HEATED OR UPSET--AND PT WOULD PASS OUT --NO EPISODES SINCE 2000  - NO MEDICATIONS--PT NO LONGER SEES CARDIOLOGIST   Family History  Problem Relation Age of Onset   Diabetes Mother    Colon polyps Mother        benign   Hypertension Father    Colon cancer Neg Hx    Esophageal cancer Neg Hx    Past Surgical History:  Procedure Laterality Date   ABDOMINAL HYSTERECTOMY  07/2011   partical   BIOPSY THYROID     COLONOSCOPY  04/2019   WISDOM TOOTH EXTRACTION  Social History   Occupational History   Not on file  Tobacco Use   Smoking status: Never   Smokeless tobacco: Never  Vaping Use   Vaping status: Never Used  Substance and Sexual Activity   Alcohol use: No    Comment: rarely   Drug use: No   Sexual activity: Yes    Birth control/protection: Surgical

## 2022-08-04 NOTE — Progress Notes (Signed)
Functional Pain Scale - descriptive words and definitions  Uncomfortable (3)  Pain is present but can complete all ADL's/sleep is slightly affected and passive distraction only gives marginal relief. Mild range order  Average Pain 5, but can increase  Lower back pain on both sides that radiates into the back of the legs to mid calf

## 2022-08-08 ENCOUNTER — Telehealth: Payer: Self-pay | Admitting: Physical Medicine and Rehabilitation

## 2022-08-08 NOTE — Telephone Encounter (Signed)
Spoke with patient and scheduled injection for 08/25/22. Patient aware driver needed

## 2022-08-08 NOTE — Telephone Encounter (Signed)
Patient called  needing to schedule an appointment with dr. Alvester Morin. The number to contact patient is [phone]

## 2022-08-25 ENCOUNTER — Ambulatory Visit (INDEPENDENT_AMBULATORY_CARE_PROVIDER_SITE_OTHER): Admitting: Physical Medicine and Rehabilitation

## 2022-08-25 ENCOUNTER — Other Ambulatory Visit: Payer: Self-pay

## 2022-08-25 DIAGNOSIS — M461 Sacroiliitis, not elsewhere classified: Secondary | ICD-10-CM | POA: Diagnosis not present

## 2022-08-25 NOTE — Progress Notes (Signed)
Functional Pain Scale - descriptive words and definitions  Uncomfortable (3)  Pain is present but can complete all ADL's/sleep is slightly affected and passive distraction only gives marginal relief. Mild range order  Average Pain 4   +Driver, -BT, -Dye Allergies.  Lower back pain on both sides. Fells pain when standing up

## 2022-09-03 MED ORDER — TRIAMCINOLONE ACETONIDE 40 MG/ML IJ SUSP
40.0000 mg | INTRAMUSCULAR | Status: AC | PRN
  Administered 2022-08-25: 40 mg via INTRA_ARTICULAR

## 2022-09-03 MED ORDER — BUPIVACAINE HCL 0.5 % IJ SOLN
2.0000 mL | INTRAMUSCULAR | Status: AC | PRN
  Administered 2022-08-25: 2 mL via INTRA_ARTICULAR

## 2022-09-03 NOTE — Progress Notes (Signed)
Margaret Davis - 52 y.o. female MRN [id number]  Date of birth: August 18, 1970  Office Visit Note: Visit Date: 08/25/2022 PCP: Pearline Cables, MD Referred by: Pearline Cables, MD  Subjective: Chief Complaint  Patient presents with   Lower Back - Pain   HPI:  Margaret Davis is a 52 y.o. female who comes in today at the request of Ellin Goodie, FNP for planned Bilateral anesthetic Sacroiliac joint arthrogram with fluoroscopic guidance.  The patient has failed conservative care including home exercise, medications, time and activity modification.  This injection will be diagnostic and hopefully therapeutic.  Please see requesting physician notes for further details and justification.   Positive Fortin finger sign, Patrick's testing,lateral compression test.     ROS Otherwise per HPI.  Assessment & Plan: Visit Diagnoses:    ICD-10-CM   1. Sacroiliitis (HCC)  M46.1 XR C-ARM NO REPORT      Plan: No additional findings.   Meds & Orders: No orders of the defined types were placed in this encounter.   Orders Placed This Encounter  Procedures   Sacroiliac Joint Inj   XR C-ARM NO REPORT    Follow-up: Return for visit to requesting provider as needed.   Procedures: Sacroiliac Joint Inj on 08/25/2022 9:30 AM Indications: pain and diagnostic evaluation Details: 22 G 3.5 in needle, fluoroscopy-guided posterior approach Medications (Right): 2 mL bupivacaine 0.5 %; 40 mg triamcinolone acetonide 40 MG/ML Medications (Left): 2 mL bupivacaine 0.5 %; 40 mg triamcinolone acetonide 40 MG/ML Outcome: tolerated well, no immediate complications  Sacroiliac Joint Intra-Articular Injection - Posterior Approach with Fluoroscopic Guidance   Position: PRONE  Additional Comments: Vital signs were monitored before and after the procedure. Patient was prepped and draped in the usual sterile fashion. The correct patient, procedure, and site was verified.   Injection Procedure Details:    Location/Site:  Sacroiliac joint  Needle size: 3.5 in Spinal Needle  Needle type: Spinal  Needle Placement: Intra-articular  Findings:  -Comments: There was excellent flow of contrast producing a partial arthrogram of the sacroiliac joint.   Procedure Details: Starting with a 90 degree vertical and midline orientation the fluoroscope was tilted cranially 20 to 25 degrees and the target area of the inferior most part of the SI joint on the side mentioned above was visualized.  The soft tissues overlying this target were infiltrated with 4 ml. of 1% Lidocaine without Epinephrine. A #22 gauge spinal needle was inserted perpendicular to the fluoroscope table and advanced into the posterior inferior joint space using fluoroscopic guidance.  Position in the joint space was confirmed by obtaining a partial arthrogram using a 2 ml. volume of Isovue-250 contrast agent. After negative aspirate for gross pus or blood, the injectate was delivered to the joint. Radiographs were obtained for documentation purposes.   Additional Comments:   Dressing: Bandaid    Post-procedure details: Patient was observed during the procedure. Post-procedure instructions were reviewed.  Patient left the clinic in stable condition.    There was excellent flow of contrast producing a partial arthrogram of the sacroiliac joint.  Procedure, treatment alternatives, risks and benefits explained, specific risks discussed. Consent was given by the patient. Immediately prior to procedure a time out was called to verify the correct patient, procedure, equipment, support staff and site/side marked as required. Patient was prepped and draped in the usual sterile fashion.          Clinical History: No specialty comments available.     Objective:  VS:  HT:    WT:   BMI:     BP:   HR: bpm  TEMP: ( )  RESP:  Physical Exam   Imaging: No results found.

## 2022-10-12 IMAGING — US US THYROID
1 series · 12 of 25 positions shown · non-contrast
Comparison: 02/20/2019

CLINICAL DATA: Nodule

EXAM:
THYROID ULTRASOUND
TECHNIQUE: Ultrasound examination of the thyroid gland and adjacent soft
tissues was performed.

[Series 1: us thyroid · 12 of 50 slices shown]
[im 3/50]
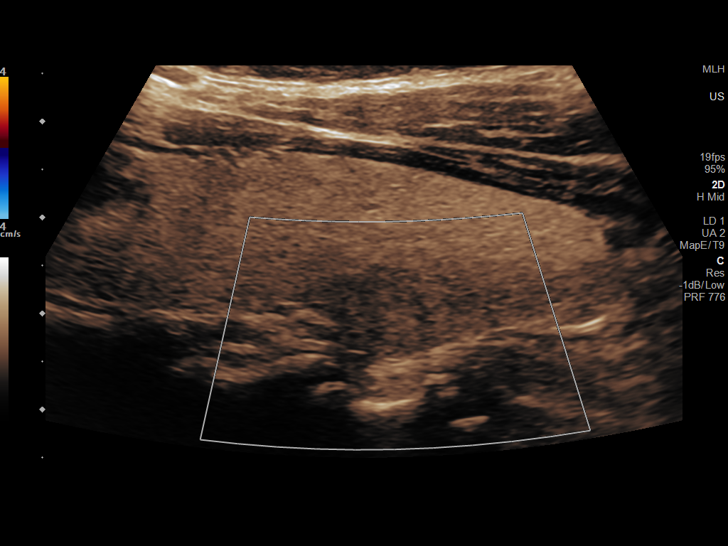
[im 7/50]
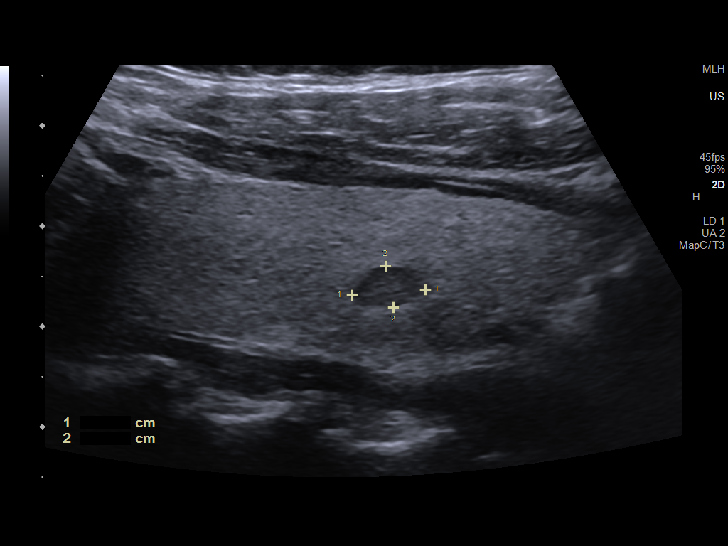
[im 11/50]
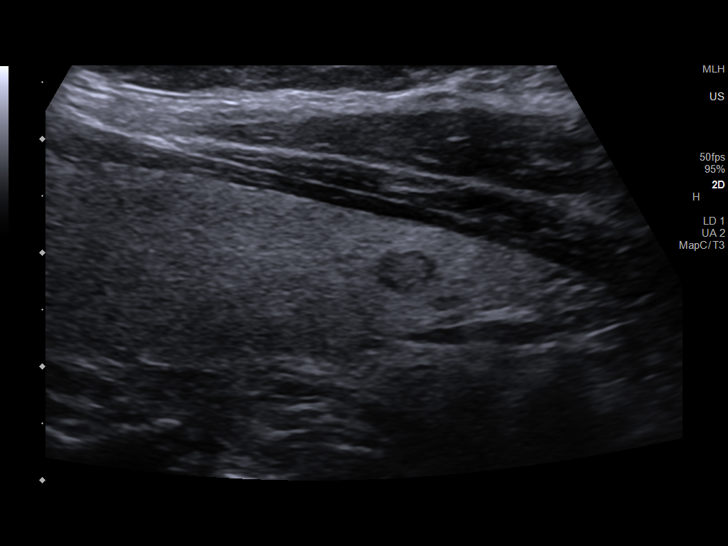
[im 15/50]
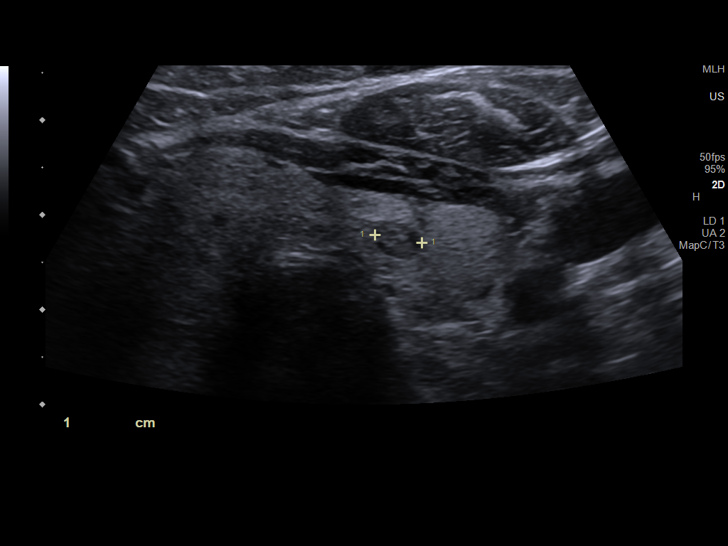
[im 19/50]
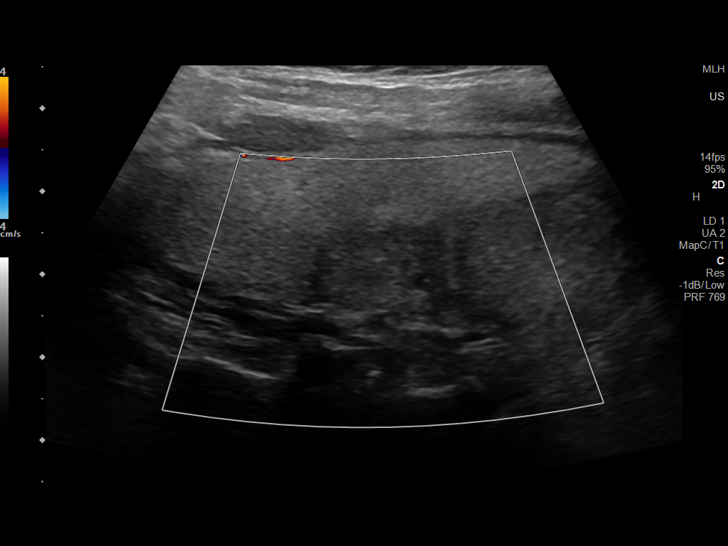
[im 23/50]
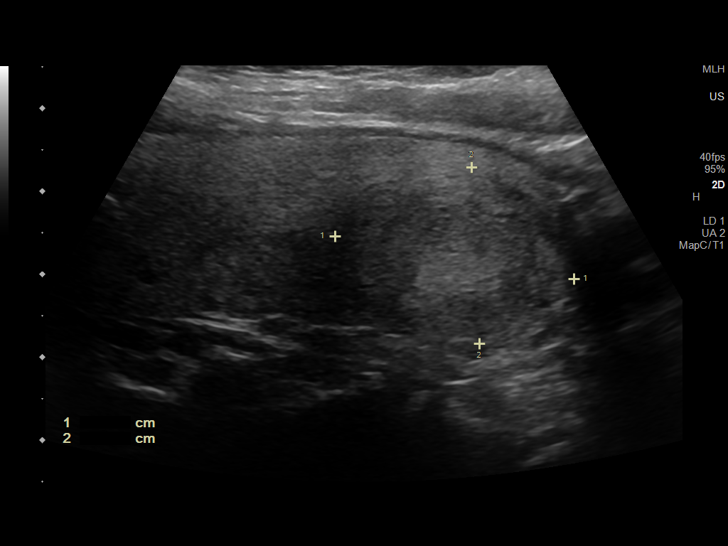
[im 27/50]
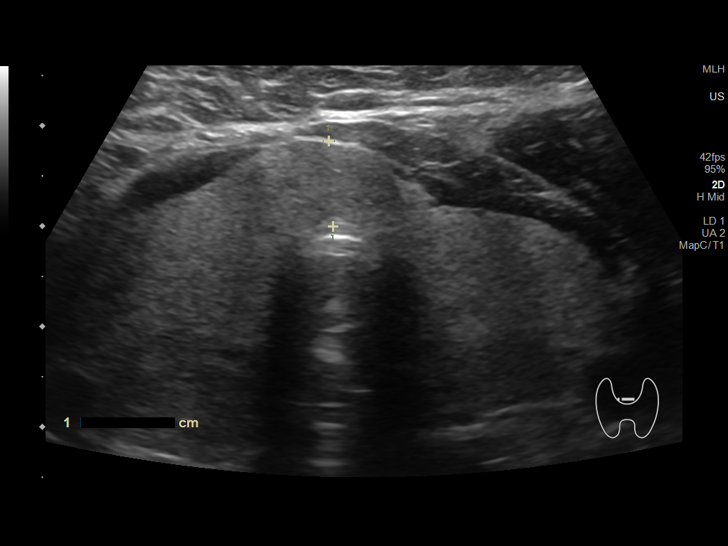
[im 31/50]
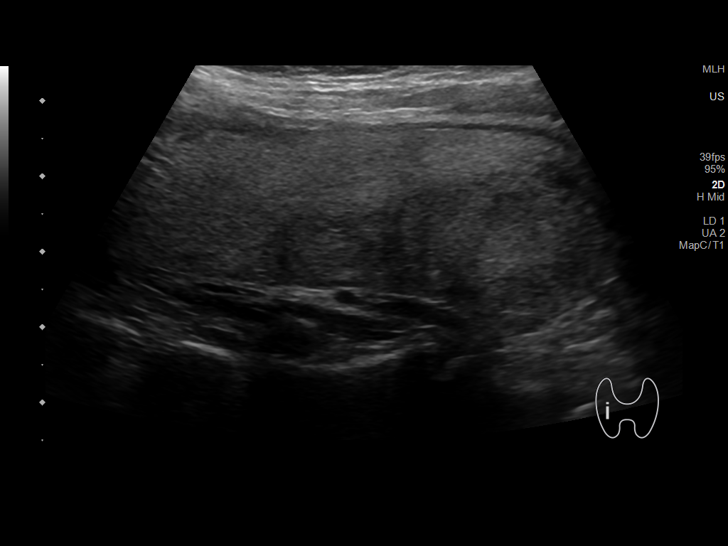
[im 35/50]
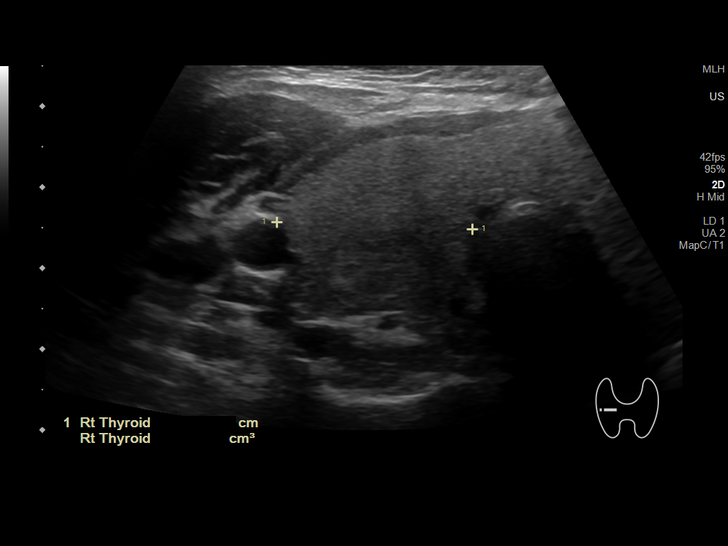
[im 39/50]
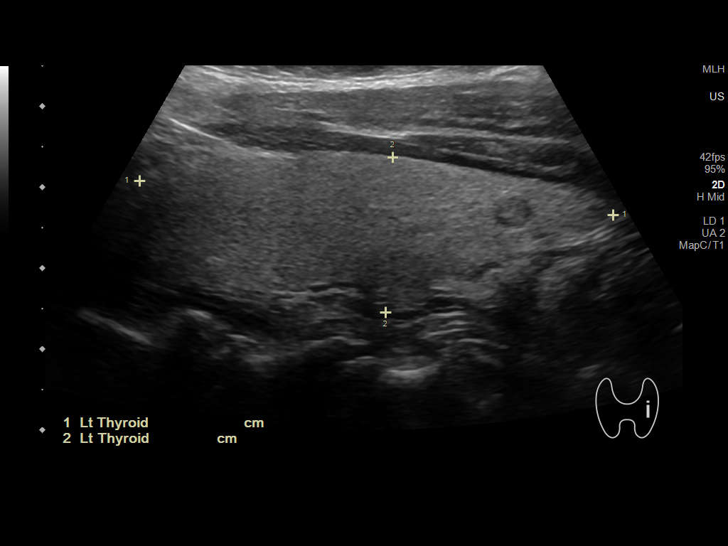
[im 43/50]
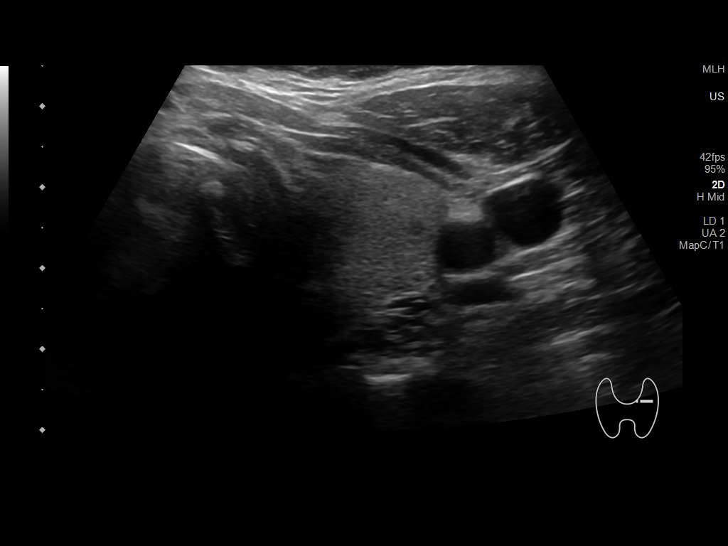
[im 47/50]
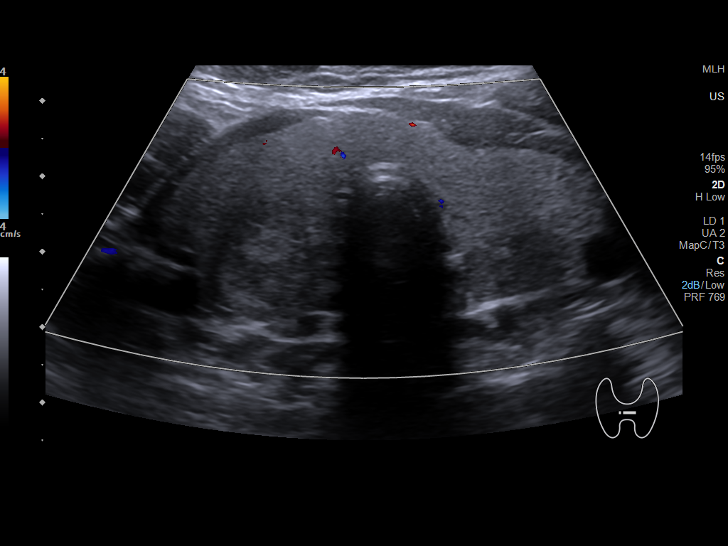

[12 of 25 positions shown; findings below may reference images not displayed]

FINDINGS: Parenchymal Echotexture: Normal

Isthmus: 0.9 cm thickness, previously

Right lobe: 6.8 x 2.7 x 2.4 cm, previously 6.3 x 2.1 x

Left lobe: 5.9 x 1.9 x 2.2 cm, previously 5.7 x 1.9 x

_________________________________________________________

Estimated total number of nodules >/= 1 cm: 3

Number of spongiform nodules >/=  2 cm not described below (TR1): 0

Number of mixed cystic and solid nodules >/= 1.5 cm not described
below (TR2): 0

_________________________________________________________

Nodule # 1:

Prior biopsy: No

Location: Right; mid

Maximum size: 1.9 cm; Other 2 dimensions: 1.3 x 1.6 cm, previously,
1.6 x 1 x 1.2 cm

Composition: solid/almost completely solid (2)

Echogenicity: hypoechoic (2)

Shape: not taller-than-wide (0)

Margins: smooth (0)

Echogenic foci: none (0)

ACR TI-RADS total points: 4.

ACR TI-RADS risk category:  TR4 (4-6 points).

Significant change in size (>/= 20% in two dimensions and minimal
increase of 2 mm): Yes

Change in features: No

Change in ACR TI-RADS risk category: No

ACR TI-RADS recommendations:

**Given size (>/= 1.5 cm) and appearance, fine needle aspiration of
this moderately suspicious nodule should be considered based on
TI-RADS criteria.

_________________________________________________________

Nodule # 2:

Prior biopsy: No

Location: Right; inferior

Maximum size: 2.9 cm; Other 2 dimensions: 2.1 x 2.3 cm, previously,
2.4 x 1.8 x 2.2 cm

Composition: solid/almost completely solid (2)

Echogenicity: isoechoic (1)

Shape: not taller-than-wide (0)

Margins: ill-defined (0)

Echogenic foci: none (0)

ACR TI-RADS total points: 3.

ACR TI-RADS risk category:  TR3 (3 points).

Significant change in size (>/= 20% in two dimensions and minimal
increase of 2 mm): No

Change in features: No

Change in ACR TI-RADS risk category: No

ACR TI-RADS recommendations:

**Given size (>/= 2.5 cm) and appearance, fine needle aspiration of
this mildly suspicious nodule should be considered based on TI-RADS
criteria.

_________________________________________________________

Nodule # 3:

Prior biopsy: No

Location: Left; mid

Maximum size: 1.2 cm; Other 2 dimensions: 1 x 1 cm, previously,
x 0.8 x 1 cm

Composition: solid/almost completely solid (2)

Echogenicity: hypoechoic (2)

Shape: not taller-than-wide (0)

Margins: ill-defined (0)

Echogenic foci: none (0)

ACR TI-RADS total points: 4.

ACR TI-RADS risk category:  TR4 (4-6 points).

Significant change in size (>/= 20% in two dimensions and minimal
increase of 2 mm): No

Change in features: No

Change in ACR TI-RADS risk category: No

ACR TI-RADS recommendations:

*Given size (>/= 1 - 1.4 cm) and appearance, a follow-up ultrasound
in 1 year should be considered based on TI-RADS criteria.

Nodule # 4: 0.7 cm hypoechoic nodule without calcifications, mid
left; This nodule does NOT meet TI-RADS criteria for biopsy or
dedicated follow-up.

Nodule # 5: 0.6 cm hypoechoic nodule without calcifications,
inferior left; This nodule does NOT meet TI-RADS criteria for biopsy
or dedicated follow-up.

_________________________________________________________
IMPRESSION: 1. Thyromegaly with bilateral nodules.
2. Recommend FNA biopsy of moderately suspicious 1.9 cm mid right
AND mildly suspicious 2.9 cm inferior right nodules.
3. Recommend annual/biennial ultrasound follow-up of additional
nodules as above, until stability x5 years confirmed.

The above is in keeping with the ACR TI-RADS recommendations - [HOSPITAL] 6622;[DATE].

## 2022-10-31 ENCOUNTER — Other Ambulatory Visit: Payer: Self-pay | Admitting: Family Medicine

## 2022-11-08 NOTE — Progress Notes (Unsigned)
[facility] [address], [address] [place], [place] [address] [phone]  Date:  11/09/2022   Name:  Margaret Davis   DOB:  Nov 20, 1970   MRN:  [id number]  PCP:  Pearline Cables, MD    Chief Complaint: No chief complaint on file.   History of Present Illness:  Margaret Davis is a 52 y.o. very pleasant female patient who presents with the following:  Patient seen today with concern of illness Most recent visit with myself was in July History of essential hypertension, prediabetes, previous COVID-19 with persistent symptoms, hyperlipidemia, sleep apnea on CPAP, obesity, GERD, vitamin D deficiency   We did start her on fluoxetine in July due to increased anxiety I switched her from amlodipine to losartan also at that time due to gum hypertrophy  Patient Active Problem List   Diagnosis Date Noted   Prediabetes 12/08/2020   Essential hypertension 12/25/2019   Dyspnea on exertion 12/25/2019   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 12/19/2019   Ageusia 12/19/2019   Anosmia 12/19/2019   Olfactory impairment 09/10/2019   Hyperlipidemia 02/14/2019   OSA on CPAP 02/04/2013   Obesity (BMI 30-39.9) 02/04/2013   Syncope    GERD (gastroesophageal reflux disease)    Panic attacks     Past Medical History:  Diagnosis Date   Anxiety    Elevated cholesterol    Essential hypertension 12/25/2019   GERD (gastroesophageal reflux disease)    OCCASIONAL   Mass    RIGHT OVARY  --ABDOMINAL PAIN   Panic attacks    Sleep apnea    Syncope    PT STATES HER HEART AND BRAIN "MISFIRE" IF OVERLY HEATED OR UPSET--AND PT WOULD PASS OUT --NO EPISODES SINCE 2000  - NO MEDICATIONS--PT NO LONGER SEES CARDIOLOGIST    Past Surgical History:  Procedure Laterality Date   ABDOMINAL HYSTERECTOMY  07/2011   partical   BIOPSY THYROID     COLONOSCOPY  04/2019   WISDOM TOOTH EXTRACTION      Social History    Tobacco Use   Smoking status: Never   Smokeless tobacco: Never  Vaping Use   Vaping status: Never Used  Substance Use Topics   Alcohol use: No    Comment: rarely   Drug use: No    Family History  Problem Relation Age of Onset   Diabetes Mother    Colon polyps Mother        benign   Hypertension Father    Colon cancer Neg Hx    Esophageal cancer Neg Hx     Allergies  Allergen Reactions   Orange Oil Hives    Patient reports she "can drink OJ without a problem."   Latex Hives and Other (See Comments)    CONTACT WITH LATEX CAUSES HIVES   Omeprazole Other (See Comments)    Abdominal pain   Other     CAN'T EAT ORANGES-BUT CAN DRINK OJ   Phenobarbital Other (See Comments)    AS ACHILD--THOUGHT TO BE HAVING SEIZURES--HAD ALLERGIC REACTION TO PHENOBARBITAL.   LATER TOLD SHE DID NOT HAVE SEIZURES    Medication list has been reviewed and updated.  Current Outpatient Medications on File Prior to Visit  Medication Sig Dispense Refill   amLODipine (NORVASC) 5 MG tablet Take 1 tablet (5 mg total) by mouth daily. 90 tablet 0   cholecalciferol (VITAMIN D) 1000 UNITS tablet Take 1,000 Units by mouth  daily.     clonazePAM (KLONOPIN) 0.5 MG tablet TAKE 1/2 TO 1 TABLET BY MOUTH TWICE DAILY AS NEEDED FOR ANXIETY 45 tablet 1   diazepam (VALIUM) 5 MG tablet Take one tablet by mouth with food one hour prior to procedure. May repeat 30 minutes prior if needed. 2 tablet 0   EPINEPHrine (EPIPEN 2-PAK) 0.3 mg/0.3 mL IJ SOAJ injection Inject 0.3 mg into the muscle as needed for anaphylaxis. 1 each prn   FLOVENT HFA 44 MCG/ACT inhaler Inhale 2 puffs into the lungs in the morning and at bedtime. 1 each 12   FLUoxetine (PROZAC) 20 MG capsule Take 1 capsule (20 mg total) by mouth daily. 90 capsule 3   ibuprofen (ADVIL) 400 MG tablet      loratadine (CLARITIN) 10 MG tablet Take 10 mg by mouth daily.     losartan (COZAAR) 25 MG tablet Take 1 tablet (25 mg total) by mouth daily. 90 tablet 1    meloxicam (MOBIC) 15 MG tablet TAKE 1 TABLET(15 MG) BY MOUTH DAILY 30 tablet 0   pantoprazole (PROTONIX) 40 MG tablet TAKE 1 TABLET(40 MG) BY MOUTH DAILY 90 tablet 3   predniSONE (DELTASONE) 20 MG tablet Take 40 mg by mouth daily for 5 days, then 20 mg by mouth daily for 5 days 15 tablet 0   simvastatin (ZOCOR) 20 MG tablet TAKE 1 TABLET(20 MG) BY MOUTH AT BEDTIME 90 tablet 3   No current facility-administered medications on file prior to visit.    Review of Systems:  As per HPI- otherwise negative.   Physical Examination: There were no vitals filed for this visit. There were no vitals filed for this visit. There is no height or weight on file to calculate BMI. Ideal Body Weight:    GEN: no acute distress. HEENT: Atraumatic, Normocephalic.  Ears and Nose: No external deformity. CV: RRR, No M/G/R. No JVD. No thrill. No extra heart sounds. PULM: CTA B, no wheezes, crackles, rhonchi. No retractions. No resp. distress. No accessory muscle use. ABD: S, NT, ND, +BS. No rebound. No HSM. EXTR: No c/c/e PSYCH: Normally interactive. Conversant.    Assessment and Plan: ***  Signed Abbe Amsterdam, MD

## 2022-11-09 ENCOUNTER — Ambulatory Visit: Admitting: Family Medicine

## 2022-11-09 VITALS — BP 132/80 | HR 70 | Temp 98.1°F | Resp 18 | Ht 64.0 in | Wt 231.8 lb

## 2022-11-09 DIAGNOSIS — R0981 Nasal congestion: Secondary | ICD-10-CM | POA: Diagnosis not present

## 2022-11-09 DIAGNOSIS — J0110 Acute frontal sinusitis, unspecified: Secondary | ICD-10-CM

## 2022-11-09 LAB — POC COVID19 BINAXNOW: SARS Coronavirus 2 Ag: NEGATIVE

## 2022-11-09 MED ORDER — AMOXICILLIN 500 MG PO CAPS: 1000.0000 mg | ORAL_CAPSULE | Freq: Two times a day (BID) | ORAL | 0 refills | Status: DC

## 2022-11-09 MED ORDER — FLUTICASONE PROPIONATE 50 MCG/ACT NA SUSP: 2.0000 | Freq: Every day | NASAL | 6 refills | Status: DC

## 2022-11-09 NOTE — Patient Instructions (Signed)
It was good to see you again today- I hope you are feeling better soon- please let me know if not!    Amoxicillin for sinus infection Please get your flu shot once well

## 2022-11-16 ENCOUNTER — Encounter: Payer: Self-pay | Admitting: Family Medicine

## 2022-12-07 ENCOUNTER — Encounter: Payer: Self-pay | Admitting: Family Medicine

## 2022-12-07 ENCOUNTER — Ambulatory Visit (HOSPITAL_BASED_OUTPATIENT_CLINIC_OR_DEPARTMENT_OTHER)
Admission: RE | Admit: 2022-12-07 | Discharge: 2022-12-07 | Disposition: A | Discharge status: Home / Self Care | Source: Ambulatory Visit | Attending: Family Medicine | Admitting: Family Medicine

## 2022-12-07 ENCOUNTER — Ambulatory Visit: Admitting: Family Medicine

## 2022-12-07 VITALS — BP 132/70 | HR 60 | Temp 98.0°F | Resp 18 | Ht 64.0 in | Wt 235.6 lb

## 2022-12-07 DIAGNOSIS — I10 Essential (primary) hypertension: Secondary | ICD-10-CM

## 2022-12-07 DIAGNOSIS — R82998 Other abnormal findings in urine: Secondary | ICD-10-CM

## 2022-12-07 DIAGNOSIS — Z13220 Encounter for screening for lipoid disorders: Secondary | ICD-10-CM

## 2022-12-07 DIAGNOSIS — R7303 Prediabetes: Secondary | ICD-10-CM

## 2022-12-07 DIAGNOSIS — K219 Gastro-esophageal reflux disease without esophagitis: Secondary | ICD-10-CM | POA: Insufficient documentation

## 2022-12-07 DIAGNOSIS — Z1329 Encounter for screening for other suspected endocrine disorder: Secondary | ICD-10-CM

## 2022-12-07 DIAGNOSIS — J0110 Acute frontal sinusitis, unspecified: Secondary | ICD-10-CM

## 2022-12-07 LAB — LIPID PANEL
Cholesterol: 214 mg/dL — ABNORMAL HIGH (ref 0–200)
HDL: 76.2 mg/dL (ref >39.00)
LDL Cholesterol: 122 mg/dL — ABNORMAL HIGH (ref 0–99)
NonHDL: 138.27
Total CHOL/HDL Ratio: 3
Triglycerides: 79 mg/dL (ref 0.0–149.0)
VLDL: 15.8 mg/dL (ref 0.0–40.0)

## 2022-12-07 LAB — POCT URINALYSIS DIP (MANUAL ENTRY)
Bilirubin, UA: NEGATIVE
Blood, UA: NEGATIVE
Glucose, UA: NEGATIVE mg/dL
Ketones, POC UA: NEGATIVE mg/dL
Leukocytes, UA: NEGATIVE
Nitrite, UA: NEGATIVE
Protein Ur, POC: NEGATIVE mg/dL
Spec Grav, UA: 1.025 (ref 1.010–1.025)
Urobilinogen, UA: 0.2 U/dL
pH, UA: 6 (ref 5.0–8.0)

## 2022-12-07 LAB — COMPREHENSIVE METABOLIC PANEL
ALT: 12 U/L (ref 0–35)
AST: 14 U/L (ref 0–37)
Albumin: 4.3 g/dL (ref 3.5–5.2)
Alkaline Phosphatase: 73 U/L (ref 39–117)
BUN: 9 mg/dL (ref 6–23)
CO2: 31 meq/L (ref 19–32)
Calcium: 9.7 mg/dL (ref 8.4–10.5)
Chloride: 102 meq/L (ref 96–112)
Creatinine, Ser: 0.79 mg/dL (ref 0.40–1.20)
GFR: 86.68 mL/min (ref >60.00)
Glucose, Bld: 86 mg/dL (ref 70–99)
Potassium: 3.9 meq/L (ref 3.5–5.1)
Sodium: 137 meq/L (ref 135–145)
Total Bilirubin: 0.5 mg/dL (ref 0.2–1.2)
Total Protein: 7.2 g/dL (ref 6.0–8.3)

## 2022-12-07 LAB — CBC
HCT: 39.4 % (ref 36.0–46.0)
Hemoglobin: 12.7 g/dL (ref 12.0–15.0)
MCHC: 32.4 g/dL (ref 30.0–36.0)
MCV: 89.4 fL (ref 78.0–100.0)
Platelets: 352 10*3/uL (ref 150.0–400.0)
RBC: 4.4 Mil/uL (ref 3.87–5.11)
RDW: 14 % (ref 11.5–15.5)
WBC: 5.2 10*3/uL (ref 4.0–10.5)

## 2022-12-07 LAB — HEMOGLOBIN A1C: Hgb A1c MFr Bld: 5.6 % (ref 4.6–6.5)

## 2022-12-07 LAB — TSH: TSH: 1.13 u[IU]/mL (ref 0.35–5.50)

## 2022-12-07 MED ORDER — AMOXICILLIN-POT CLAVULANATE 875-125 MG PO TABS: 1.0000 | ORAL_TABLET | Freq: Two times a day (BID) | ORAL | 0 refills | Status: DC

## 2022-12-07 NOTE — Patient Instructions (Signed)
It was good to see you again today, please let me know if your sinus symptoms do not improve with the Augmentin.  I will be in touch with your lab work is soon as possible.  Please stop by imaging on the ground floor to have x-rays on your way out

## 2022-12-07 NOTE — Progress Notes (Signed)
[facility] at [facility] [address], [address] [place], [place] [address] [phone]  Date:  12/07/2022   Name:  Margaret Davis   DOB:  12-Feb-1970   MRN:  [id number]  PCP:  Pearline Cables, MD    Chief Complaint: Sinus Problem (Seen 1 month ago for similar sxs: Rx Amoxicillin and Flonase. /Concerns/ questions: 1. Pt says she also has been having flagulence which is worsening her reflux. 2. Pt also mentioned having uti sxs)   History of Present Illness:  Margaret Davis is a 52 y.o. very pleasant female patient who presents with the following:  Pt seen today with concern of sinus symptoms and a couple of other concerns History of essential hypertension, prediabetes, previous COVID-19 with persistent symptoms, hyperlipidemia, sleep apnea on CPAP, obesity, GERD, vitamin D deficiency   Last seen by myself 11/6: Patient seen today with nasal and sinus congestion for about 1 week. COVID test negative today. Will treat for sinusitis with amoxicillin, also refilled her fluticasone nasal spray   She notes one side of her sinuses improved but she never got 100% well- then her sx got worse again today The right side improved but the left never did- it has not necessarily gotten worse but just never got better She is using zyrtec that she has on hand  She also noted urinary sx a couple of weeks ago- she was having some urinary leakage with cough.  Now that her cough is better the leakage has resolved No dysuria but her urine appears darker than normal so she was concerned No hematuria  Finally, she notes she is more "gassy" than usual.  She thinks this may be due to eating spicy or than typical food over Thanksgiving.  No vomiting or diarrhea.  She does notes her belly feels a bit bloated and she is passing more gas than usual    Patient Active Problem List   Diagnosis Date Noted   Prediabetes 12/08/2020   Essential hypertension 12/25/2019    Dyspnea on exertion 12/25/2019   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 12/19/2019   Ageusia 12/19/2019   Anosmia 12/19/2019   Olfactory impairment 09/10/2019   Hyperlipidemia 02/14/2019   OSA on CPAP 02/04/2013   Obesity (BMI 30-39.9) 02/04/2013   Syncope    GERD (gastroesophageal reflux disease)    Panic attacks     Past Medical History:  Diagnosis Date   Anxiety    Elevated cholesterol    Essential hypertension 12/25/2019   GERD (gastroesophageal reflux disease)    OCCASIONAL   Mass    RIGHT OVARY  --ABDOMINAL PAIN   Panic attacks    Sleep apnea    Syncope    PT STATES HER HEART AND BRAIN "MISFIRE" IF OVERLY HEATED OR UPSET--AND PT WOULD PASS OUT --NO EPISODES SINCE 2000  - NO MEDICATIONS--PT NO LONGER SEES CARDIOLOGIST    Past Surgical History:  Procedure Laterality Date   ABDOMINAL HYSTERECTOMY  07/2011   partical   BIOPSY THYROID     COLONOSCOPY  04/2019   WISDOM TOOTH EXTRACTION      Social History   Tobacco Use   Smoking status: Never   Smokeless tobacco: Never  Vaping Use   Vaping status: Never Used  Substance Use Topics   Alcohol use: No    Comment: rarely   Drug use: No    Family History  Problem Relation Age of Onset  Diabetes Mother    Colon polyps Mother        benign   Hypertension Father    Colon cancer Neg Hx    Esophageal cancer Neg Hx     Allergies  Allergen Reactions   Orange Oil Hives    Patient reports she "can drink OJ without a problem."   Latex Hives and Other (See Comments)    CONTACT WITH LATEX CAUSES HIVES   Omeprazole Other (See Comments)    Abdominal pain   Other     CAN'T EAT ORANGES-BUT CAN DRINK OJ   Phenobarbital Other (See Comments)    AS ACHILD--THOUGHT TO BE HAVING SEIZURES--HAD ALLERGIC REACTION TO PHENOBARBITAL.   LATER TOLD SHE DID NOT HAVE SEIZURES    Medication list has been reviewed and updated.  Current Outpatient Medications on File Prior to Visit  Medication Sig  Dispense Refill   cetirizine (ZYRTEC) 10 MG tablet Take 10 mg by mouth daily.     cholecalciferol (VITAMIN D) 1000 UNITS tablet Take 1,000 Units by mouth daily.     EPINEPHrine (EPIPEN 2-PAK) 0.3 mg/0.3 mL IJ SOAJ injection Inject 0.3 mg into the muscle as needed for anaphylaxis. 1 each prn   FLOVENT HFA 44 MCG/ACT inhaler Inhale 2 puffs into the lungs in the morning and at bedtime. 1 each 12   FLUoxetine (PROZAC) 20 MG capsule Take 1 capsule (20 mg total) by mouth daily. 90 capsule 3   fluticasone (FLONASE) 50 MCG/ACT nasal spray Place 2 sprays into both nostrils daily. 16 g 6   ibuprofen (ADVIL) 400 MG tablet      losartan (COZAAR) 25 MG tablet Take 1 tablet (25 mg total) by mouth daily. 90 tablet 1   pantoprazole (PROTONIX) 40 MG tablet TAKE 1 TABLET(40 MG) BY MOUTH DAILY 90 tablet 3   simvastatin (ZOCOR) 20 MG tablet TAKE 1 TABLET(20 MG) BY MOUTH AT BEDTIME 90 tablet 3   No current facility-administered medications on file prior to visit.    Review of Systems:  As per HPI- otherwise negative.   Physical Examination: Vitals:   12/07/22 1117  BP: 132/70  Pulse: 60  Resp: 18  Temp: 98 F (36.7 C)  SpO2: 98%   Vitals:   12/07/22 1117  Weight: 235 lb 9.6 oz (106.9 kg)  Height: 5\' 4"  (1.626 m)   Body mass index is 40.44 kg/m. Ideal Body Weight: Weight in (lb) to have BMI = 25: 145.3  GEN: no acute distress.  Obese, looks well HEENT: Atraumatic, Normocephalic.  Bilateral TM wnl, oropharynx normal.  PEERL,EOMI. sinuses are tender to percussion Ears and Nose: No external deformity. CV: RRR, No M/G/R. No JVD. No thrill. No extra heart sounds. PULM: CTA B, no wheezes, crackles, rhonchi. No retractions. No resp. distress. No accessory muscle use. ABD: S, NT, ND, +BS. No rebound. No HSM.  Belly is benign on exam EXTR: No c/c/e PSYCH: Normally interactive. Conversant.      Assessment and Plan: Dark urine - Plan: Urine Culture, POCT urinalysis dipstick, CANCELED: POCT  Urinalysis Dipstick (Automated)  Acute non-recurrent frontal sinusitis - Plan: amoxicillin-clavulanate (AUGMENTIN) 875-125 MG tablet  Essential hypertension - Plan: CBC, Comprehensive metabolic panel  Prediabetes - Plan: Hemoglobin A1c  Thyroid disorder screening - Plan: TSH  Gastroesophageal reflux disease, unspecified whether esophagitis present - Plan: DG Abd Acute W/Chest  Screening, lipid - Plan: Lipid panel  Patient seen today with a couple of concerns Urine dip appears benign, await urine culture. She notes a partial  resolution of sinusitis symptoms with plain amoxicillin 1 month ago.  Will try treatment with Augmentin this time.  She will let me know if this is not helpful Blood pressure well-controlled, will obtain routine blood work also for prediabetes and thyroid disorder screening, hyperlipidemia  She has felt a bit more bloated and flatulent recently.  Will obtain abdominal films  Signed Abbe Amsterdam, MD  Received results as below, message to patient  DG Abd Acute W/Chest  Result Date: 12/07/2022 CLINICAL DATA:  Excessive gas, reflux and bloating. EXAM: DG ABDOMEN ACUTE WITH 1 VIEW CHEST COMPARISON:  Chest radiographs-11/04/2021 FINDINGS: Unchanged cardiac silhouette and mediastinal contours. No focal parenchymal opacities. No pleural effusion or pneumothorax. No evidence of edema. Mild nonspecific gaseous distention of a loop of small bowel within the left mid abdomen measuring 4.5 cm in diameter. Otherwise, unremarkable bowel gas pattern. Unremarkable colonic stool burden. No pneumoperitoneum, pneumatosis or portal venous gas. Punctate phleboliths overlie the lower pelvis bilaterally. No definitive abnormal intra-abdominal calcifications No acute osseous abnormalities. Degenerative change of the lower lumbar spine is suspected though incompletely evaluated. IMPRESSION: 1. No acute cardiopulmonary disease. 2. Nonspecific mild gaseous distention of loop of small bowel  within the left mid abdomen, presumably physiologic. Otherwise, unremarkable gas pattern. 3. Unremarkable colonic stool burden. Electronically Signed   By: Simonne Come M.D.   On: 12/07/2022 13:16    Results for orders placed or performed in visit on 12/07/22  CBC  Result Value Ref Range   WBC 5.2 4.0 - 10.5 K/uL   RBC 4.40 3.87 - 5.11 Mil/uL   Platelets 352.0 150.0 - 400.0 K/uL   Hemoglobin 12.7 12.0 - 15.0 g/dL   HCT 04.5 40.9 - 81.1 %   MCV 89.4 78.0 - 100.0 fl   MCHC 32.4 30.0 - 36.0 g/dL   RDW 91.4 78.2 - 95.6 %  Comprehensive metabolic panel  Result Value Ref Range   Sodium 137 135 - 145 mEq/L   Potassium 3.9 3.5 - 5.1 mEq/L   Chloride 102 96 - 112 mEq/L   CO2 31 19 - 32 mEq/L   Glucose, Bld 86 70 - 99 mg/dL   BUN 9 6 - 23 mg/dL   Creatinine, Ser 2.13 0.40 - 1.20 mg/dL   Total Bilirubin 0.5 0.2 - 1.2 mg/dL   Alkaline Phosphatase 73 39 - 117 U/L   AST 14 0 - 37 U/L   ALT 12 0 - 35 U/L   Total Protein 7.2 6.0 - 8.3 g/dL   Albumin 4.3 3.5 - 5.2 g/dL   GFR 08.65 >78.46 mL/min   Calcium 9.7 8.4 - 10.5 mg/dL  Hemoglobin N6E  Result Value Ref Range   Hgb A1c MFr Bld 5.6 4.6 - 6.5 %  Lipid panel  Result Value Ref Range   Cholesterol 214 (H) 0 - 200 mg/dL   Triglycerides 95.2 0.0 - 149.0 mg/dL   HDL 84.13 >24.40 mg/dL   VLDL 10.2 0.0 - 72.5 mg/dL   LDL Cholesterol 366 (H) 0 - 99 mg/dL   Total CHOL/HDL Ratio 3    NonHDL 138.27   TSH  Result Value Ref Range   TSH 1.13 0.35 - 5.50 uIU/mL  POCT urinalysis dipstick  Result Value Ref Range   Color, UA yellow yellow   Clarity, UA clear clear   Glucose, UA negative negative mg/dL   Bilirubin, UA negative negative   Ketones, POC UA negative negative mg/dL   Spec Grav, UA 4.403 4.742 - 1.025   Blood, UA  negative negative   pH, UA 6.0 5.0 - 8.0   Protein Ur, POC negative negative mg/dL   Urobilinogen, UA 0.2 0.2 or 1.0 E.U./dL   Nitrite, UA Negative Negative   Leukocytes, UA Negative Negative

## 2022-12-08 LAB — URINE CULTURE
MICRO NUMBER:: 15807771
Result:: NO GROWTH
SPECIMEN QUALITY:: ADEQUATE

## 2022-12-09 ENCOUNTER — Encounter: Payer: Self-pay | Admitting: Family Medicine

## 2022-12-16 ENCOUNTER — Other Ambulatory Visit: Payer: Self-pay | Admitting: Family Medicine

## 2022-12-16 DIAGNOSIS — E782 Mixed hyperlipidemia: Secondary | ICD-10-CM

## 2023-01-16 ENCOUNTER — Encounter: Payer: Self-pay | Admitting: Family Medicine

## 2023-01-16 ENCOUNTER — Other Ambulatory Visit: Payer: Self-pay | Admitting: Family Medicine

## 2023-01-16 DIAGNOSIS — I10 Essential (primary) hypertension: Secondary | ICD-10-CM

## 2023-01-24 ENCOUNTER — Other Ambulatory Visit: Payer: Self-pay | Admitting: Family Medicine

## 2023-01-24 DIAGNOSIS — K219 Gastro-esophageal reflux disease without esophagitis: Secondary | ICD-10-CM

## 2023-01-25 NOTE — Progress Notes (Unsigned)
[facility] at [facility] [address] [place], [place] [address] [phone]  Date:  01/26/2023   Name:  Margaret Davis   DOB:  [birth year]   MRN:  [id number]  PCP:  Pearline Cables, MD    Chief Complaint: Rash (X 1 week L eye lid. This started after trying a new facial wash. Stings if she using anything on it. It is itchy. )   History of Present Illness:  Margaret Davis is a 53 y.o. very pleasant female patient who presents with the following:  Patient seen today with concern of a rash on her eyelid Most recent visit with myself was in December when she was ill with sinusitis and UTI symptoms  History of essential hypertension, prediabetes, previous COVID-19 with persistent symptoms, hyperlipidemia, sleep apnea on CPAP, obesity, GERD, vitamin D deficiency   Seasonal flu vaccine- give today  COVID booster- recommended   She used a facial wash and serum system that she was not used to last week- she noted an itchy area on her left eyelid that popped up a couple of days after she used the skin care product.  Did not continue to use the product The rest of her skin seems normal and she is otherwise feeling ok  The area on her left lid crusted and then she "peeled it off," but then it seemed to grow back- she now notes this skin lesion on her left upper lid for about a week total   She also does note some possible sinus headaches around the right eye- did not seem like a big deal, she thought it might be just allergies  Readers only- no contacts Vision is normal/ baseline per her perception She had the shingles series already   Fax number [phone] [facility] Patient Active Problem List   Diagnosis Date Noted   Prediabetes 12/08/2020   Essential hypertension 12/25/2019   Dyspnea on exertion 12/25/2019   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 12/19/2019   Ageusia 12/19/2019   Anosmia  12/19/2019   Olfactory impairment 09/10/2019   Hyperlipidemia 02/14/2019   OSA on CPAP 02/04/2013   Obesity (BMI 30-39.9) 02/04/2013   Syncope    GERD (gastroesophageal reflux disease)    Panic attacks     Past Medical History:  Diagnosis Date   Anxiety    Elevated cholesterol    Essential hypertension 12/25/2019   GERD (gastroesophageal reflux disease)    OCCASIONAL   Mass    RIGHT OVARY  --ABDOMINAL PAIN   Panic attacks    Sleep apnea    Syncope    PT STATES HER HEART AND BRAIN "MISFIRE" IF OVERLY HEATED OR UPSET--AND PT WOULD PASS OUT --NO EPISODES SINCE 2000  - NO MEDICATIONS--PT NO LONGER SEES CARDIOLOGIST    Past Surgical History:  Procedure Laterality Date   ABDOMINAL HYSTERECTOMY  07/2011   partical   BIOPSY THYROID     COLONOSCOPY  04/2019   WISDOM TOOTH EXTRACTION      Social History   Tobacco Use   Smoking status: Never   Smokeless tobacco: Never  Vaping Use   Vaping status: Never Used  Substance Use Topics   Alcohol use: No    Comment: rarely   Drug use: No    Family History  Problem Relation Age of Onset   Diabetes Mother    Colon polyps Mother  benign   Hypertension Father    Colon cancer Neg Hx    Esophageal cancer Neg Hx     Allergies  Allergen Reactions   Orange Oil Hives    Patient reports she "can drink OJ without a problem."   Latex Hives and Other (See Comments)    CONTACT WITH LATEX CAUSES HIVES   Omeprazole Other (See Comments)    Abdominal pain   Other     CAN'T EAT ORANGES-BUT CAN DRINK OJ   Phenobarbital Other (See Comments)    AS ACHILD--THOUGHT TO BE HAVING SEIZURES--HAD ALLERGIC REACTION TO PHENOBARBITAL.   LATER TOLD SHE DID NOT HAVE SEIZURES    Medication list has been reviewed and updated.  Current Outpatient Medications on File Prior to Visit  Medication Sig Dispense Refill   cetirizine (ZYRTEC) 10 MG tablet Take 10 mg by mouth daily.     cholecalciferol (VITAMIN D) 1000 UNITS tablet Take 1,000 Units by  mouth daily.     EPINEPHrine (EPIPEN 2-PAK) 0.3 mg/0.3 mL IJ SOAJ injection Inject 0.3 mg into the muscle as needed for anaphylaxis. 1 each prn   FLOVENT HFA 44 MCG/ACT inhaler Inhale 2 puffs into the lungs in the morning and at bedtime. 1 each 12   FLUoxetine (PROZAC) 20 MG capsule Take 1 capsule (20 mg total) by mouth daily. 90 capsule 3   fluticasone (FLONASE) 50 MCG/ACT nasal spray Place 2 sprays into both nostrils daily. 16 g 6   ibuprofen (ADVIL) 400 MG tablet      losartan (COZAAR) 25 MG tablet TAKE 1 TABLET(25 MG) BY MOUTH DAILY 90 tablet 1   pantoprazole (PROTONIX) 40 MG tablet Take 1 tablet (40 mg total) by mouth daily. 90 tablet 1   simvastatin (ZOCOR) 20 MG tablet TAKE 1 TABLET(20 MG) BY MOUTH AT BEDTIME 90 tablet 3   No current facility-administered medications on file prior to visit.    Review of Systems:  As per HPI- otherwise negative.   Physical Examination: Vitals:   01/26/23 0947  BP: 134/84  Pulse: 93  Resp: 18  Temp: 98 F (36.7 C)  SpO2: 97%   Vitals:   01/26/23 0947  Weight: 233 lb 6.4 oz (105.9 kg)  Height: 5\' 4"  (1.626 m)   Body mass index is 40.06 kg/m. Ideal Body Weight: Weight in (lb) to have BMI = 25: 145.3  GEN: no acute distress. Obese, looks well  HEENT: Atraumatic, Normocephalic.  Bilateral TM wnl, oropharynx normal.  PEERL,EOMI.   Ears and Nose: No external deformity. CV: RRR, No M/G/R. No JVD. No thrill. No extra heart sounds. PULM: CTA B, no wheezes, crackles, rhonchi. No retractions. No resp. distress. No accessory muscle use. ABD: S, NT, ND, +BS. No rebound. No HSM. EXTR: No c/c/e PSYCH: Normally interactive. Conversant.  There is a crusty lesion on the upper left lid, medial aspect    Assessment and Plan: Eyelid lesion - Plan: valACYclovir (VALTREX) 1000 MG tablet  Immunization due - Plan: Flu vaccine trivalent PF, 6mos and older(Flulaval,Afluria,Fluarix,Fluzone) Pt seen today with skin lesion on her left upper lid as above.   She also has some vague discomfort in the left forehead/ around the eye.  Possible mild shingles- she is s/p shingrix. May also certainly just be a non- specific skin lesion   [facility] kindly agreed to see pt at 2pm today Valtrex Moisturize with vaseline Keep me up to date on her progress    Signed Abbe Amsterdam, MD

## 2023-01-26 ENCOUNTER — Encounter: Payer: Self-pay | Admitting: Family Medicine

## 2023-01-26 ENCOUNTER — Ambulatory Visit: Admitting: Family Medicine

## 2023-01-26 VITALS — BP 134/84 | HR 93 | Temp 98.0°F | Resp 18 | Ht 64.0 in | Wt 233.4 lb

## 2023-01-26 DIAGNOSIS — Z23 Encounter for immunization: Secondary | ICD-10-CM

## 2023-01-26 DIAGNOSIS — H029 Unspecified disorder of eyelid: Secondary | ICD-10-CM | POA: Diagnosis not present

## 2023-01-26 MED ORDER — VALACYCLOVIR HCL 1 G PO TABS: 1000.0000 mg | ORAL_TABLET | Freq: Three times a day (TID) | ORAL | 0 refills | Status: DC

## 2023-01-26 NOTE — Patient Instructions (Addendum)
It was good to see you today!  I suspect the skin lesion on your eyelid will heal and resolve- use vaseline or other very plain moisturizer to keep it soft and moisturized If not resolving over the next 1-2 weeks please alert me-Sooner if worse.   Flu shot today!   Recommend covid booster if not done the last 6 months or so   I will also cover you with valtrex just in case this might be shingles- take three times a day for 7 days I will touch base with Dr Dione Booze and make sure they don't need to see you

## 2023-03-20 ENCOUNTER — Encounter: Payer: Self-pay | Admitting: Family Medicine

## 2023-03-21 NOTE — Patient Instructions (Incomplete)
 Good to see you again today!  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

## 2023-03-21 NOTE — Progress Notes (Unsigned)
 [facility] at [facility] [address] [place], [place] [address] [phone]  Date:  03/23/2023   Name:  Margaret Davis   DOB:  Jan 27, 1970   MRN:  [id number]  PCP:  Pearline Cables, MD    Chief Complaint: Pulled muscle/ HTN (Pt says she increased her Losartan to 50 mg on Monday 03/20/23. (She is going to the Endodontists today for a cracked tooth).)   History of Present Illness:  Margaret Davis is a 53 y.o. very pleasant female patient who presents with the following:  Patient seen today with concern of a "pulled muscle" in her chest and recent elevation of blood pressure Most recent visit with myself was in January for concern about her eyelid History of essential hypertension, prediabetes, previous COVID-19 with persistent symptoms, hyperlipidemia, sleep apnea on CPAP, obesity, GERD, vitamin D deficiency   She reached out to me earlier this week with concern about her blood pressure-she had visited the nurse at school and was found have a blood pressure of 170/100, also mentioned this pulled muscle in her chest.  I questioned her about the pulled muscle, she notes she is able to reproduce the pain consistent with musculoskeletal origin She was taking losartan 25 at that time- we had her increase her dose to 50 mg  She is tolerating the losartan 50 fine and her BP looks much better today  She notes a pain in the left side of her sternum- she can reproduce it by pressing on the area, moving her left arm, or laying on her left side She has noted this for about 2 weeks She is not aware of any injury Will come and go Non extional No SOB  Lab panel done in December  She is also currently dealing with a broken tooth which may need a root canal or crown Patient Active Problem List   Diagnosis Date Noted   Prediabetes 12/08/2020   Essential hypertension 12/25/2019   Dyspnea on exertion 12/25/2019   COVID-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  manifesting chronic loss of smell and taste 12/19/2019   Ageusia 12/19/2019   Anosmia 12/19/2019   Olfactory impairment 09/10/2019   Hyperlipidemia 02/14/2019   OSA on CPAP 02/04/2013   Obesity (BMI 30-39.9) 02/04/2013   Syncope    GERD (gastroesophageal reflux disease)    Panic attacks     Past Medical History:  Diagnosis Date   Anxiety    Elevated cholesterol    Essential hypertension 12/25/2019   GERD (gastroesophageal reflux disease)    OCCASIONAL   Mass    RIGHT OVARY  --ABDOMINAL PAIN   Panic attacks    Sleep apnea    Syncope    PT STATES HER HEART AND BRAIN "MISFIRE" IF OVERLY HEATED OR UPSET--AND PT WOULD PASS OUT --NO EPISODES SINCE 2000  - NO MEDICATIONS--PT NO LONGER SEES CARDIOLOGIST    Past Surgical History:  Procedure Laterality Date   ABDOMINAL HYSTERECTOMY  07/2011   partical   BIOPSY THYROID     COLONOSCOPY  04/2019   WISDOM TOOTH EXTRACTION      Social History   Tobacco Use   Smoking status: Never   Smokeless tobacco: Never  Vaping Use   Vaping status: Never Used  Substance Use Topics   Alcohol use: No    Comment: rarely   Drug use: No    Family History  Problem Relation Age of Onset   Diabetes Mother    Colon  polyps Mother        benign   Hypertension Father    Colon cancer Neg Hx    Esophageal cancer Neg Hx     Allergies  Allergen Reactions   Orange Oil Hives    Patient reports she "can drink OJ without a problem."   Latex Hives and Other (See Comments)    CONTACT WITH LATEX CAUSES HIVES   Omeprazole Other (See Comments)    Abdominal pain   Other     CAN'T EAT ORANGES-BUT CAN DRINK OJ   Phenobarbital Other (See Comments)    AS ACHILD--THOUGHT TO BE HAVING SEIZURES--HAD ALLERGIC REACTION TO PHENOBARBITAL.   LATER TOLD SHE DID NOT HAVE SEIZURES    Medication list has been reviewed and updated.  Current Outpatient Medications on File Prior to Visit  Medication Sig Dispense Refill   cetirizine (ZYRTEC) 10 MG tablet Take 10  mg by mouth daily.     cholecalciferol (VITAMIN D) 1000 UNITS tablet Take 1,000 Units by mouth daily.     EPINEPHrine (EPIPEN 2-PAK) 0.3 mg/0.3 mL IJ SOAJ injection Inject 0.3 mg into the muscle as needed for anaphylaxis. 1 each prn   FLOVENT HFA 44 MCG/ACT inhaler Inhale 2 puffs into the lungs in the morning and at bedtime. 1 each 12   FLUoxetine (PROZAC) 20 MG capsule Take 1 capsule (20 mg total) by mouth daily. 90 capsule 3   fluticasone (FLONASE) 50 MCG/ACT nasal spray Place 2 sprays into both nostrils daily. 16 g 6   ibuprofen (ADVIL) 400 MG tablet      pantoprazole (PROTONIX) 40 MG tablet Take 1 tablet (40 mg total) by mouth daily. 90 tablet 1   simvastatin (ZOCOR) 20 MG tablet TAKE 1 TABLET(20 MG) BY MOUTH AT BEDTIME 90 tablet 3   valACYclovir (VALTREX) 1000 MG tablet Take 1 tablet (1,000 mg total) by mouth 3 (three) times daily. 21 tablet 0   No current facility-administered medications on file prior to visit.    Review of Systems:  As per HPI- otherwise negative.   Physical Examination: Vitals:   03/23/23 1101 03/23/23 1133  BP: (!) 136/92 120/85  Pulse: 79   Resp: 18   Temp: 97.8 F (36.6 C)   SpO2: 97%    Vitals:   03/23/23 1101  Weight: 229 lb (103.9 kg)  Height: 5\' 4"  (1.626 m)   Body mass index is 39.31 kg/m. Ideal Body Weight: Weight in (lb) to have BMI = 25: 145.3  GEN: no acute distress.  Obese, looks well HEENT: Atraumatic, Normocephalic.  Bilateral TM wnl, oropharynx normal.  PEERL,EOMI.   Ears and Nose: No external deformity. CV: RRR, No M/G/R. No JVD. No thrill. No extra heart sounds. PULM: CTA B, no wheezes, crackles, rhonchi. No retractions. No resp. distress. No accessory muscle use. ABD: S, NT, ND. No rebound. No HSM. EXTR: No c/c/e PSYCH: Normally interactive. Conversant.  I am easily able to reproduce her discomfort by pressing on the left sternal and rib border.  No redness or swelling.  EKG: SR Compared with tracing from 11/23- no major  changes noted  Assessment and Plan: Chest pain, unspecified type - Plan: EKG 12-Lead, Troponin I (High Sensitivity)  Essential hypertension - Plan: losartan (COZAAR) 50 MG tablet  Gastroesophageal reflux disease without esophagitis - Plan: Ambulatory referral to Gastroenterology  Patient seen today with concern of chest pain, seems musculoskeletal in origin.  She has also been dealing with elevated blood pressure, looking better on a slightly increased  dose of losartan..  EKG today is reassuring, will also obtain a troponin. I offered a chest x-ray, at this time she declines but we can do this if her symptoms persist  She has noted worsening of her reflux symptoms recently, referral back to GI for follow-up  We did decide to obtain a troponin, I will be in touch with this report  I asked her to please let me know if symptoms do not resolve over the next few days-if getting worse she will seek further care  Signed Abbe Amsterdam, MD  Received her troponin, message to patient Results for orders placed or performed in visit on 03/23/23  Troponin I (High Sensitivity)   Collection Time: 03/23/23 11:39 AM  Result Value Ref Range   High Sens Troponin I 7 2 - 17 ng/L

## 2023-03-23 ENCOUNTER — Encounter: Payer: Self-pay | Admitting: Family Medicine

## 2023-03-23 ENCOUNTER — Ambulatory Visit: Admitting: Family Medicine

## 2023-03-23 VITALS — BP 120/85 | HR 79 | Temp 97.8°F | Resp 18 | Ht 64.0 in | Wt 229.0 lb

## 2023-03-23 DIAGNOSIS — R079 Chest pain, unspecified: Secondary | ICD-10-CM | POA: Diagnosis not present

## 2023-03-23 DIAGNOSIS — I10 Essential (primary) hypertension: Secondary | ICD-10-CM | POA: Diagnosis not present

## 2023-03-23 DIAGNOSIS — K219 Gastro-esophageal reflux disease without esophagitis: Secondary | ICD-10-CM | POA: Diagnosis not present

## 2023-03-23 LAB — TROPONIN I (HIGH SENSITIVITY): High Sens Troponin I: 7 ng/L (ref 2–17)

## 2023-03-23 MED ORDER — LOSARTAN POTASSIUM 50 MG PO TABS
50.0000 mg | ORAL_TABLET | Freq: Every day | ORAL | 3 refills | Status: DC
Start: 2023-03-23 — End: 2023-06-12

## 2023-04-10 ENCOUNTER — Ambulatory Visit: Admitting: Family Medicine

## 2023-04-10 VITALS — BP 122/82 | HR 80 | Temp 97.8°F | Resp 18 | Ht 64.0 in | Wt 227.6 lb

## 2023-04-10 DIAGNOSIS — S99929A Unspecified injury of unspecified foot, initial encounter: Secondary | ICD-10-CM | POA: Diagnosis not present

## 2023-04-10 NOTE — Patient Instructions (Signed)
 It was good to see you today-we removed the portion of your toenail that had cracked off today.  Keep the remaining nail and nailbed covered with a Band-Aid until sensitivity is gone.  File or clip off any sharp or loose pieces of nail that may develop over the next few weeks.  I am not sure if you will lose the rest of the nail ingrown you went underneath, or if the nail you have will continue to stay attached and just grow out.  Let me know if you have any questions or concerns or if you think you are getting an infection  I think you sprained your right medial collateral ligament of the knee.  I expect this will continue to improve, but let me know if it does not

## 2023-04-10 NOTE — Progress Notes (Signed)
 [facility] [address], [address] [place], [place] [address] [phone]  Date:  04/10/2023   Name:  Margaret Davis   DOB:  [birth year]   MRN:  [id number]  PCP:  Pearline Cables, MD    Chief Complaint: No chief complaint on file.   History of Present Illness:  Margaret Davis is a 53 y.o. very pleasant female patient who presents with the following:  Patient seen today with concern about one of her toenails History of essential hypertension, prediabetes, previous COVID-19 with persistent symptoms, hyperlipidemia, sleep apnea on CPAP, obesity, GERD, vitamin D deficiency  Most recent visit with myself was about 3 weeks ago when she was having some atypical chest pain-her evaluation was reassuring, thought to be musculoskeletal pain.  Troponin was normal  She hit her left great toenail last week- 4 days ago- and thinks she cracked the nail horizontally.  It is tender but not getting worse.  The nail appears broken and like the distal portion could be removed at this point She has gel polish on   She also twisted her right knee about 2 weeks ago, stepped on an uneven place at work.  She notes some discomfort at the medial knee.  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 12/19/2019   Ageusia 12/19/2019   Anosmia 12/19/2019   Olfactory impairment 09/10/2019   Hyperlipidemia 02/14/2019   OSA on CPAP 02/04/2013   Obesity (BMI 30-39.9) 02/04/2013   Syncope    GERD (gastroesophageal reflux disease)    Panic attacks     Past Medical History:  Diagnosis Date   Anxiety    Elevated cholesterol    Essential hypertension 12/25/2019   GERD (gastroesophageal reflux disease)    OCCASIONAL   Mass    RIGHT OVARY  --ABDOMINAL PAIN    Panic attacks    Sleep apnea    Syncope    PT STATES HER HEART AND BRAIN "MISFIRE" IF OVERLY HEATED OR UPSET--AND PT WOULD PASS OUT --NO EPISODES SINCE 2000  - NO MEDICATIONS--PT NO LONGER SEES CARDIOLOGIST    Past Surgical History:  Procedure Laterality Date   ABDOMINAL HYSTERECTOMY  07/2011   partical   BIOPSY THYROID     COLONOSCOPY  04/2019   WISDOM TOOTH EXTRACTION      Social History   Tobacco Use   Smoking status: Never   Smokeless tobacco: Never  Vaping Use   Vaping status: Never Used  Substance Use Topics   Alcohol use: No    Comment: rarely   Drug use: No    Family History  Problem Relation Age of Onset   Diabetes Mother    Colon polyps Mother        benign   Hypertension Father    Colon cancer Neg Hx    Esophageal cancer Neg Hx     Allergies  Allergen Reactions   Orange Oil Hives    Patient reports she "can drink OJ without a problem."   Latex Hives and Other (See Comments)    CONTACT WITH LATEX CAUSES HIVES   Omeprazole Other (See Comments)    Abdominal pain   Other     CAN'T EAT ORANGES-BUT CAN DRINK OJ   Phenobarbital Other (See Comments)  AS ACHILD--THOUGHT TO BE HAVING SEIZURES--HAD ALLERGIC REACTION TO PHENOBARBITAL.   LATER TOLD SHE DID NOT HAVE SEIZURES    Medication list has been reviewed and updated.  Current Outpatient Medications on File Prior to Visit  Medication Sig Dispense Refill   cetirizine (ZYRTEC) 10 MG tablet Take 10 mg by mouth daily.     cholecalciferol (VITAMIN D) 1000 UNITS tablet Take 1,000 Units by mouth daily.     EPINEPHrine (EPIPEN 2-PAK) 0.3 mg/0.3 mL IJ SOAJ injection Inject 0.3 mg into the muscle as needed for anaphylaxis. 1 each prn   FLOVENT HFA 44 MCG/ACT inhaler Inhale 2 puffs into the lungs in the morning and at bedtime. 1 each 12   FLUoxetine (PROZAC) 20 MG capsule Take 1 capsule (20 mg total) by mouth daily. 90 capsule 3   fluticasone (FLONASE) 50 MCG/ACT nasal spray Place 2 sprays into both nostrils  daily. 16 g 6   ibuprofen (ADVIL) 400 MG tablet      losartan (COZAAR) 50 MG tablet Take 1 tablet (50 mg total) by mouth daily. 90 tablet 3   pantoprazole (PROTONIX) 40 MG tablet Take 1 tablet (40 mg total) by mouth daily. 90 tablet 1   simvastatin (ZOCOR) 20 MG tablet TAKE 1 TABLET(20 MG) BY MOUTH AT BEDTIME 90 tablet 3   valACYclovir (VALTREX) 1000 MG tablet Take 1 tablet (1,000 mg total) by mouth 3 (three) times daily. 21 tablet 0   No current facility-administered medications on file prior to visit.    Review of Systems:  As per HPI- otherwise negative.   Physical Examination: Vitals:   04/10/23 1402  BP: 122/82  Pulse: 80  Resp: 18  Temp: 97.8 F (36.6 C)  SpO2: 97%   Vitals:   04/10/23 1402  Weight: 227 lb 9.6 oz (103.2 kg)  Height: 5\' 4"  (1.626 m)   Body mass index is 39.07 kg/m. Ideal Body Weight: Weight in (lb) to have BMI = 25: 145.3 GEN: No acute distress; alert,appropriate. PULM: Breathing comfortably in no respiratory distress PSYCH: Normally interactive.  The left great toenail is 90% fractured horizontally, the distal portion is free at the nailbed.  With patient permission I used scissors to cut the remaining small amount of attached nail.  Patient tolerated procedure well with no complications, no blood loss.  Applied a Band-Aid to protect the tender nailbed Right knee: No effusion, no redness or heat.  Minimal tenderness at the medial collateral ligament distal attachment.  Ligaments are stable  Assessment and Plan: Injury of great toenail  Patient seen today with a couple of concerns which we addressed Removed broken toenail as described above  It was good to see you today-we removed the portion of your toenail that had cracked off today.  Keep the remaining nail and nailbed covered with a Band-Aid until sensitivity is gone.  File or clip off any sharp or loose pieces of nail that may develop over the next few weeks.  I am not sure if you will lose the  rest of the nail ingrown you went underneath, or if the nail you have will continue to stay attached and just grow out.  Let me know if you have any questions or concerns or if you think you are getting an infection  I think you sprained your right medial collateral ligament of the knee.  I expect this will continue to improve, but let me know if it does not  Signed Abbe Amsterdam, MD

## 2023-04-19 ENCOUNTER — Encounter: Payer: Self-pay | Admitting: Physician Assistant

## 2023-04-20 ENCOUNTER — Ambulatory Visit: Admitting: Neurology

## 2023-04-20 ENCOUNTER — Encounter: Payer: Self-pay | Admitting: Neurology

## 2023-04-20 VITALS — BP 162/82 | HR 75 | Ht 64.0 in | Wt 229.0 lb

## 2023-04-20 DIAGNOSIS — K219 Gastro-esophageal reflux disease without esophagitis: Secondary | ICD-10-CM

## 2023-04-20 DIAGNOSIS — G4733 Obstructive sleep apnea (adult) (pediatric): Secondary | ICD-10-CM

## 2023-04-20 DIAGNOSIS — R430 Anosmia: Secondary | ICD-10-CM

## 2023-04-20 NOTE — Progress Notes (Signed)
 Provider:  Melvyn Novas, MD  Primary Care Physician:  Pearline Cables, MD [address] [place] [address]     Referring Provider: Pearline Cables, Md [address] [place],  [place] [address]   Cc Dr Rosemary Holms          Chief Complaint according to patient   Patient presents with:                HISTORY OF PRESENT ILLNESS:  Margaret Davis is a 53 y.o. female patient who is here for revisit 04/20/2023 for  poor CPAP compliance; Margaret Davis is using a CPAP machine with a setting from a ramp function at 4 cm and a treatment function between 5 and 15 cm of water with 2 cm expiratory pressure relief.  Her compliance report has been meager she has used the machine only 8 out of the last 30 days and her average pressure that she needs at night is 8.8 cm water her residual AHI when she uses CPAP is 1.3/h which is excellent.  She does not have central apneas all residuals are obstructive.  But she has noted since she has been less compliant and that started in mid February is an elevation in her blood pressure.  I urged her today to restart CPAP it will prevent headaches it will help with blood pressure control it can help with blood sugar control and in the long-term it helps with stroke and heart attack prevention so I also looked at her symptoms and she endorsed the Epworth Sleepiness Scale at 5 out of 24 points the fatigue CPAP severity scale at 11 out of 2563 points.  This is excellent.  And there has been no change in her medication list.  We will part today basically with the instruction to resume compliant use of CPAP.  Machine is due for replacement in 12-2024.    Margaret Davis is a 53 y.o. 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  She has a smell kit for use at home , reports no improvement.    Chief  concern according to patient :  I have lost my mother in law on 10-07-2021 . Right after my visit with Shawnie Dapper, NP.  Anxiety resulted when she drove here to our building today.  She has high BP.   Dr Patsy Lager is treating her BP, which is not well controlled, she has not yet had another visit with Dr Melvenia Needles.  We are discussing the effect of CPAP on OSA and On HTN.    She is frustrated with her inability to lose weight.   Weight is main OSA risk factor . She struggles with weight, her insurance did not want to cover Ozempic but pushed phentermine , and she cannot use this drug now with her BP readings.  She has prediabetes, HBA1c at 5.9.      We reviewed her CPAP date, she has been compliant uses CPAP for 80% of days and 65% of hours, average is 5.5 hours,  she had to take a break when she had Bronchitis. She likes her CPAP.  AHI is 0.8/h  Leak of air 0 litres.  Nasal pillow interface      Review of Systems: Out of a complete 14 system review, the  patient complains of only the following symptoms, and all other reviewed systems are negative.:    POST COVID- Anosmia for floral and spices, not for peppermint and vanilla, coconut. Couldn't smell tobacco smoke last week. Feels as if she is not recovering.   Epworth SS 4 / 24   FSS: 11/ 63 points.   Social History   Socioeconomic History   Marital status: Married    Spouse name: Not on file   Number of children: 2   Years of education: Not on file   Highest education level: Master's degree (e.g., MA, MS, MEng, MEd, MSW, MBA)  Occupational History   Not on file  Tobacco Use   Smoking status: Never   Smokeless tobacco: Never  Vaping Use   Vaping status: Never Used  Substance and Sexual Activity   Alcohol use: No    Comment: rarely   Drug use: No   Sexual activity: Yes    Birth control/protection: Surgical  Other Topics Concern   Not on file  Social History Narrative   ** Merged History Encounter **       Social Drivers  of Health   Financial Resource Strain: Low Risk  (07/25/2022)   Overall Financial Resource Strain (CARDIA)    Difficulty of Paying Living Expenses: Not hard at all  Food Insecurity: No Food Insecurity (07/25/2022)   Hunger Vital Sign    Worried About Running Out of Food in the Last Year: Never true    Ran Out of Food in the Last Year: Never true  Transportation Needs: No Transportation Needs (07/25/2022)   PRAPARE - Administrator, Civil Service (Medical): No    Lack of Transportation (Non-Medical): No  Physical Activity: Unknown (07/25/2022)   Exercise Vital Sign    Days of Exercise per Week: Patient declined    Minutes of Exercise per Session: Not on file  Stress: No Stress Concern Present (07/25/2022)   [employer] of Occupational Health - Occupational Stress Questionnaire    Feeling of Stress : Only a little  Social Connections: Socially Integrated (07/25/2022)   Social Connection and Isolation Panel [NHANES]    Frequency of Communication with Friends and Family: Twice a week    Frequency of Social Gatherings with Friends and Family: Once a week    Attends Religious Services: More than 4 times per year    Active Member of [employer] or Organizations: Yes    Attends Engineer, structural: More than 4 times per year    Marital Status: Married    Family History  Problem Relation Age of Onset   Diabetes Mother    Colon polyps Mother        benign   Hypertension Father    Colon cancer Neg Hx    Esophageal cancer Neg Hx     Past Medical History:  Diagnosis Date   Anxiety    Elevated cholesterol    Essential hypertension 12/25/2019   GERD (gastroesophageal reflux disease)    OCCASIONAL   Mass    RIGHT OVARY  --ABDOMINAL PAIN   Panic attacks    Sleep apnea    Syncope    PT STATES HER HEART AND BRAIN "MISFIRE" IF OVERLY HEATED OR UPSET--AND PT WOULD PASS OUT --NO EPISODES SINCE 2000  - NO MEDICATIONS--PT NO LONGER SEES CARDIOLOGIST    Past Surgical  History:  Procedure Laterality Date   ABDOMINAL HYSTERECTOMY  07/2011   partical   BIOPSY THYROID     COLONOSCOPY  04/2019   WISDOM TOOTH EXTRACTION       Current Outpatient Medications on File Prior to Visit  Medication Sig Dispense Refill   cetirizine (ZYRTEC) 10 MG tablet Take 10 mg by mouth daily.     cholecalciferol (VITAMIN D) 1000 UNITS tablet Take 1,000 Units by mouth daily.     EPINEPHrine (EPIPEN 2-PAK) 0.3 mg/0.3 mL IJ SOAJ injection Inject 0.3 mg into the muscle as needed for anaphylaxis. 1 each prn   FLOVENT HFA 44 MCG/ACT inhaler Inhale 2 puffs into the lungs in the morning and at bedtime. 1 each 12   FLUoxetine (PROZAC) 20 MG capsule Take 1 capsule (20 mg total) by mouth daily. 90 capsule 3   fluticasone (FLONASE) 50 MCG/ACT nasal spray Place 2 sprays into both nostrils daily. 16 g 6   ibuprofen (ADVIL) 400 MG tablet      losartan (COZAAR) 50 MG tablet Take 1 tablet (50 mg total) by mouth daily. 90 tablet 3   pantoprazole (PROTONIX) 40 MG tablet Take 1 tablet (40 mg total) by mouth daily. 90 tablet 1   simvastatin (ZOCOR) 20 MG tablet TAKE 1 TABLET(20 MG) BY MOUTH AT BEDTIME 90 tablet 3   valACYclovir (VALTREX) 1000 MG tablet Take 1 tablet (1,000 mg total) by mouth 3 (three) times daily. 21 tablet 0   No current facility-administered medications on file prior to visit.    Allergies  Allergen Reactions   Orange Oil Hives    Patient reports she "can drink OJ without a problem."   Latex Hives and Other (See Comments)    CONTACT WITH LATEX CAUSES HIVES   Omeprazole Other (See Comments)    Abdominal pain   Other     CAN'T EAT ORANGES-BUT CAN DRINK OJ   Phenobarbital Other (See Comments)    AS ACHILD--THOUGHT TO BE HAVING SEIZURES--HAD ALLERGIC REACTION TO PHENOBARBITAL.   LATER TOLD SHE DID NOT HAVE SEIZURES     DIAGNOSTIC DATA (LABS, IMAGING, TESTING) - I reviewed patient records, labs, notes, testing and imaging myself where available.  Lab Results  Component  Value Date   WBC 5.2 12/07/2022   HGB 12.7 12/07/2022   HCT 39.4 12/07/2022   MCV 89.4 12/07/2022   PLT 352.0 12/07/2022      Component Value Date/Time   NA 137 12/07/2022 1213   K 3.9 12/07/2022 1213   CL 102 12/07/2022 1213   CO2 31 12/07/2022 1213   GLUCOSE 86 12/07/2022 1213   BUN 9 12/07/2022 1213   CREATININE 0.79 12/07/2022 1213   CALCIUM 9.7 12/07/2022 1213   PROT 7.2 12/07/2022 1213   ALBUMIN 4.3 12/07/2022 1213   AST 14 12/07/2022 1213   ALT 12 12/07/2022 1213   ALKPHOS 73 12/07/2022 1213   BILITOT 0.5 12/07/2022 1213   GFRNONAA >60 11/04/2021 1729   GFRAA >90 09/06/2012 1225   Lab Results  Component Value Date   CHOL 214 (H) 12/07/2022   HDL 76.20 12/07/2022   LDLCALC 122 (H) 12/07/2022   TRIG 79.0 12/07/2022   CHOLHDL 3 12/07/2022   Lab Results  Component Value Date   HGBA1C 5.6 12/07/2022   No results found for: "VITAMINB12" Lab Results  Component Value Date   TSH 1.13 12/07/2022    PHYSICAL EXAM:  Today's Vitals   04/20/23 1441 04/20/23 1444  BP: (!) 152/102 (!) 162/82  Pulse: 75 75  Weight: 229 lb (103.9 kg)   Height: 5\' 4"  (1.626 m)    Body mass index is 39.31  kg/m.   Wt Readings from Last 3 Encounters:  04/20/23 229 lb (103.9 kg)  04/10/23 227 lb 9.6 oz (103.2 kg)  03/23/23 229 lb (103.9 kg)     Ht Readings from Last 3 Encounters:  04/20/23 5\' 4"  (1.626 m)  04/10/23 5\' 4"  (1.626 m)  03/23/23 5\' 4"  (1.626 m)      General: The patient is awake, alert and appears not in acute distress. The patient is well groomed. Head: Normocephalic, atraumatic. The patient is awake, alert and appears not in acute distress. The patient is well groomed. Head: Normocephalic, atraumatic. Neck is supple. Mallampati; 3 plus,  neck circumference:16 inches . Nasal airflow patent.  Retrognathia is not seen.  Dental status: intact  Cardiovascular:  Regular rate and cardiac rhythm by pulse,  without distended neck veins. Respiratory: Lungs are clear to  auscultation.  Skin:  Without evidence of ankle edema, or rash. Trunk: The patient's posture is erect.   Neurologic exam : The patient is awake and alert, oriented to place and time.   Memory subjective described as intact.  Attention span & concentration ability appears normal.  Speech is fluent, without  dysarthria, dysphonia or aphasia.  Mood and affect are appropriate.   Cranial nerves:  loss of smell or taste reported - fully vaccinated  Pupils are equal and briskly reactive to light. Funduscopic exam deferred.  Extraocular movements in vertical and horizontal planes were intact and without nystagmus. No Diplopia. Visual fields by finger perimetry are intact.Facial motor strength is symmetric and tongue and uvula move midline. Neck ROM : rotation, tilt and flexion extension were normal for age and shoulder shrug was symmetrical.  Motor exam:  Symmetric bulk, tone and ROM.   Normal tone -symmetric grip strength .  ASSESSMENT AND PLAN 53 y.o. year old female  here with:    1)  OSA on CPAP - going back to compliant use.   2) weight and wellness referral done by GI or PCP already.   Will need a new HST by December 2026 when new machine is due.   I plan to follow up either personally or through our NP within 12 months.   I would like to thank Copland, Skipper Dumas, Md [address] [place],  [place] [address] for allowing me to meet with and to take care of this pleasant patient.    After spending a total time of  22  minutes face to face and additional time for physical and neurologic examination, review of laboratory studies,  personal review of imaging studies, reports and results of other testing and review of referral information / records as far as provided in visit,   Electronically signed by: Neomia Banner, MD 04/20/2023 3:25 PM  [facility] Board certified by The [employer] of Sleep Medicine and Diplomate of the  [employer] of [employer] Medicine. Board certified In Neurology through the ABPN, Fellow of the [employer] of Neu[employer].

## 2023-04-27 ENCOUNTER — Encounter: Payer: Self-pay | Admitting: Family Medicine

## 2023-04-27 NOTE — Telephone Encounter (Signed)
 FYI:   (The following message was placed in the appropriate pts chart via telephone encounter.)

## 2023-05-14 ENCOUNTER — Other Ambulatory Visit: Payer: Self-pay | Admitting: Family Medicine

## 2023-05-14 DIAGNOSIS — R0981 Nasal congestion: Secondary | ICD-10-CM

## 2023-05-22 ENCOUNTER — Encounter: Payer: Self-pay | Admitting: Family Medicine

## 2023-06-08 NOTE — Progress Notes (Addendum)
 [facility] [address], [address] [place], [place] [address] [phone]  Date:  06/12/2023   Name:  Margaret Davis   DOB:  [birth year]   MRN:  [id number]  PCP:  Kaylee Partridge, MD    Chief Complaint: No chief complaint on file.   History of Present Illness:  Margaret Davis is a 53 y.o. very pleasant female patient who presents with the following:  Patient seen today with concern about a possible skin tag on her right lower lid Most recent visit with myself was in April for a toe injury History of essential hypertension, prediabetes, previous COVID-19 with persistent symptoms, hyperlipidemia, sleep apnea on CPAP, obesity, GERD, vitamin D  deficiency   She has noted a small skin lesion on her right lower lid- it showed up about 2 months ago Seemed to start with a litlte pimple- she tried to pop it but nothing came out Getting gradaully larger over the last 2 months It itches but does not hurt Never had this in past  She wears readers- vision ok  Her eye doc is Dickie [facility]  She does not check BP at home very often-she does not like the cuff she has available Her parents do have HTN  We adjusted her losartan  back in March  BP Readings from Last 3 Encounters:  06/12/23 (!) 170/93  04/20/23 (!) 162/82  04/10/23 122/82     Patient Active Problem List   Diagnosis Date Noted   Prediabetes 12/08/2020   Essential hypertension 12/25/2019   Dyspnea on exertion 12/25/2019   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 12/19/2019   Ageusia 12/19/2019   Anosmia 12/19/2019   Olfactory impairment 09/10/2019   Hyperlipidemia 02/14/2019   OSA on CPAP 02/04/2013   Obesity (BMI 30-39.9) 02/04/2013   Syncope    GERD (gastroesophageal reflux disease)    Panic attacks     Past Medical History:  Diagnosis Date   Anxiety    Elevated cholesterol    Essential hypertension 12/25/2019   GERD  (gastroesophageal reflux disease)    OCCASIONAL   Mass    RIGHT OVARY  --ABDOMINAL PAIN   Panic attacks    Sleep apnea    Syncope    PT STATES HER HEART AND BRAIN "MISFIRE" IF OVERLY HEATED OR UPSET--AND PT WOULD PASS OUT --NO EPISODES SINCE 2000  - NO MEDICATIONS--PT NO LONGER SEES CARDIOLOGIST    Past Surgical History:  Procedure Laterality Date   ABDOMINAL HYSTERECTOMY  07/2011   partical   BIOPSY THYROID      COLONOSCOPY  04/2019   WISDOM TOOTH EXTRACTION      Social History   Tobacco Use   Smoking status: Never   Smokeless tobacco: Never  Vaping Use   Vaping status: Never Used  Substance Use Topics   Alcohol use: No    Comment: rarely   Drug use: No    Family History  Problem Relation Age of Onset   Diabetes Mother    Colon polyps Mother        benign   Hypertension Father    Colon cancer Neg Hx    Esophageal cancer Neg Hx     Allergies  Allergen Reactions   Orange Oil Hives    Patient reports she "can drink OJ without a problem."   Latex Hives and Other (See Comments)    CONTACT WITH LATEX CAUSES HIVES   Omeprazole  Other (  See Comments)    Abdominal pain   Other     CAN'T EAT ORANGES-BUT CAN DRINK OJ   Phenobarbital Other (See Comments)    AS ACHILD--THOUGHT TO BE HAVING SEIZURES--HAD ALLERGIC REACTION TO PHENOBARBITAL.   LATER TOLD SHE DID NOT HAVE SEIZURES    Medication list has been reviewed and updated.  Current Outpatient Medications on File Prior to Visit  Medication Sig Dispense Refill   cetirizine (ZYRTEC) 10 MG tablet Take 10 mg by mouth daily.     cholecalciferol (VITAMIN D ) 1000 UNITS tablet Take 1,000 Units by mouth daily.     EPINEPHrine  (EPIPEN  2-PAK) 0.3 mg/0.3 mL IJ SOAJ injection Inject 0.3 mg into the muscle as needed for anaphylaxis. 1 each prn   FLOVENT  HFA 44 MCG/ACT inhaler Inhale 2 puffs into the lungs in the morning and at bedtime. 1 each 12   FLUoxetine  (PROZAC ) 20 MG capsule Take 1 capsule (20 mg total) by mouth daily. 90  capsule 3   fluticasone  (FLONASE ) 50 MCG/ACT nasal spray Place 2 sprays into both nostrils daily. 16 g 6   ibuprofen  (ADVIL ) 400 MG tablet      pantoprazole  (PROTONIX ) 40 MG tablet Take 1 tablet (40 mg total) by mouth daily. 90 tablet 1   simvastatin  (ZOCOR ) 20 MG tablet TAKE 1 TABLET(20 MG) BY MOUTH AT BEDTIME 90 tablet 3   valACYclovir  (VALTREX ) 1000 MG tablet Take 1 tablet (1,000 mg total) by mouth 3 (three) times daily. 21 tablet 0   No current facility-administered medications on file prior to visit.    Review of Systems:  As per HPI- otherwise negative.   Physical Examination: Vitals:   06/12/23 1120  BP: (!) 170/93  Pulse: 71   Vitals:   06/12/23 1120  Weight: 228 lb 12.8 oz (103.8 kg)  Height: 5\' 4"  (1.626 m)   Body mass index is 39.27 kg/m. Ideal Body Weight: Weight in (lb) to have BMI = 25: 145.3  GEN: no acute distress.  Obese, looks well HEENT: Atraumatic, Normocephalic.  Bilateral TM wnl, oropharynx normal.  PEERL,EOMI.   Ears and Nose: No external deformity. CV: RRR, No M/G/R. No JVD. No thrill. No extra heart sounds. PULM: CTA B, no wheezes, crackles, rhonchi. No retractions. No resp. distress. No accessory muscle use. ABD: S, NT, ND, +BS. No rebound. No HSM. EXTR: No c/c/e PSYCH: Normally interactive. Conversant.  There is a warty appearing, sessile skin tag on the right lower lid.  It is about 7 mm in diameter.  Otherwise ENT exam is normal  Assessment and Plan: Skin tag, acquired  Essential hypertension - Plan: losartan -hydrochlorothiazide (HYZAAR) 50-12.5 MG tablet, Comprehensive metabolic panel with GFR, CBC  Patient seen today with concern of a growth on her right lower lid.  Given its location I advised her to discuss this first with her ophthalmologist.  If they are not able to remove the skin growth we may need to have her see plastic surgery.  She will let me know if a referral is needed  Blood pressure is elevated; she is on losartan  50 and  control has been borderline.  History of obesity, also both her parents had hypertension.  We increased her telmisartan/HCTZ and she will keep me posted  Signed Gates Kasal, MD  Received her labs- message to pt  Results for orders placed or performed in visit on 06/12/23  Comprehensive metabolic panel with GFR   Collection Time: 06/12/23 11:46 AM  Result Value Ref Range   Sodium 137  135 - 145 mEq/L   Potassium 3.9 3.5 - 5.1 mEq/L   Chloride 103 96 - 112 mEq/L   CO2 29 19 - 32 mEq/L   Glucose, Bld 98 70 - 99 mg/dL   BUN 10 6 - 23 mg/dL   Creatinine, Ser 1.61 0.40 - 1.20 mg/dL   Total Bilirubin 0.3 0.2 - 1.2 mg/dL   Alkaline Phosphatase 72 39 - 117 U/L   AST 13 0 - 37 U/L   ALT 10 0 - 35 U/L   Total Protein 7.0 6.0 - 8.3 g/dL   Albumin 4.2 3.5 - 5.2 g/dL   GFR 09.60 >45.40 mL/min   Calcium 9.6 8.4 - 10.5 mg/dL  CBC   Collection Time: 06/12/23 11:46 AM  Result Value Ref Range   WBC 5.0 4.0 - 10.5 K/uL   RBC 4.41 3.87 - 5.11 Mil/uL   Platelets 311.0 150.0 - 400.0 K/uL   Hemoglobin 12.5 12.0 - 15.0 g/dL   HCT 98.1 19.1 - 47.8 %   MCV 87.5 78.0 - 100.0 fl   MCHC 32.3 30.0 - 36.0 g/dL   RDW 29.5 62.1 - 30.8 %

## 2023-06-12 ENCOUNTER — Encounter: Payer: Self-pay | Admitting: Family Medicine

## 2023-06-12 ENCOUNTER — Ambulatory Visit: Admitting: Family Medicine

## 2023-06-12 VITALS — BP 160/90 | HR 71 | Ht 64.0 in | Wt 228.8 lb

## 2023-06-12 DIAGNOSIS — I10 Essential (primary) hypertension: Secondary | ICD-10-CM

## 2023-06-12 DIAGNOSIS — L918 Other hypertrophic disorders of the skin: Secondary | ICD-10-CM

## 2023-06-12 LAB — COMPREHENSIVE METABOLIC PANEL WITH GFR
ALT: 10 U/L (ref 0–35)
AST: 13 U/L (ref 0–37)
Albumin: 4.2 g/dL (ref 3.5–5.2)
Alkaline Phosphatase: 72 U/L (ref 39–117)
BUN: 10 mg/dL (ref 6–23)
CO2: 29 meq/L (ref 19–32)
Calcium: 9.6 mg/dL (ref 8.4–10.5)
Chloride: 103 meq/L (ref 96–112)
Creatinine, Ser: 0.76 mg/dL (ref 0.40–1.20)
GFR: 90.48 mL/min (ref >60.00)
Glucose, Bld: 98 mg/dL (ref 70–99)
Potassium: 3.9 meq/L (ref 3.5–5.1)
Sodium: 137 meq/L (ref 135–145)
Total Bilirubin: 0.3 mg/dL (ref 0.2–1.2)
Total Protein: 7 g/dL (ref 6.0–8.3)

## 2023-06-12 LAB — CBC
HCT: 38.6 % (ref 36.0–46.0)
Hemoglobin: 12.5 g/dL (ref 12.0–15.0)
MCHC: 32.3 g/dL (ref 30.0–36.0)
MCV: 87.5 fl (ref 78.0–100.0)
Platelets: 311 10*3/uL (ref 150.0–400.0)
RBC: 4.41 Mil/uL (ref 3.87–5.11)
RDW: 14.7 % (ref 11.5–15.5)
WBC: 5 10*3/uL (ref 4.0–10.5)

## 2023-06-12 MED ORDER — LOSARTAN POTASSIUM-HCTZ 50-12.5 MG PO TABS: 1.0000 | ORAL_TABLET | Freq: Every day | ORAL | 3 refills | Status: AC

## 2023-06-12 NOTE — Patient Instructions (Signed)
 Please give [facility] a call and see if they can help with your skin tag.  If they cannot let me know!  Let's change you over to losartan /hydrochlorothiazide for your BP Please keep an eye on your BP at home and let me know if running higher than 140/85 on a routine basis

## 2023-06-15 NOTE — Progress Notes (Signed)
 06/16/2023 Margaret Davis [id number] May 12, 1970  Referring provider: Kaylee Partridge, MD Primary GI doctor: Dr. Venice Gillis  ASSESSMENT AND PLAN:  Epigastric pain/GERD with feeling of food stuck in her esopaghus 08/16/2019 EGD unremarkable, minimal gastritis AB US  05/27/2022 unremarkable other than fatty liver 07/2022 CT AB and pelvis without contrast fatty liver No NSAIDS, no ETOH - continue the pantoprazole  40 mg daily, pepcid at night -alginate therapy given -Lifestyle changes discussed, avoid NSAIDS, ETOH, hand out given to the patient -Weight loss discussed with the patient -Schedule EGD at [facility] to evaluate GERd, esophagitis, hiatal hernia,I discussed risks of EGD with patient today, including risk of sedation, bleeding or perforation. Patient provides understanding and gave verbal consent to proceed. -Consider barium swallow -Consider GES, gastroparesis diet given  IBS constipation Colonoscopy Dr Nickey Barn unremarkable 04/2019 recall 10 years Controlled with diet  Fatty liver seen on abdominal ultrasound 2021 Unremarkable LFTs, recent normal ultrasound Fatty liver 05/2022    Latest Ref Rng & Units 06/12/2023   11:46 AM 12/07/2022   12:13 PM 11/10/2021    3:27 PM  Hepatic Function  Total Protein 6.0 - 8.3 g/dL 7.0  7.2  7.6   Albumin 3.5 - 5.2 g/dL 4.2  4.3  4.6   AST 0 - 37 U/L 13  14  14    ALT 0 - 35 U/L 10  12  13    Alk Phosphatase 39 - 117 U/L 72  73  75   Total Bilirubin 0.2 - 1.2 mg/dL 0.3  0.5  0.4    Platelets 311.0  - need LFTs and CBC monitored every 6 months, - evaluation with imaging every 2-3 years.  -Continue to work on risk factor modification including diet exercise and control of risk factors including blood sugars.  Obesity  Body mass index is 38.9 kg/m.  -Patient has been advised to make an attempt to improve diet and exercise patterns to aid in weight loss. -Recommended diet heavy in fruits and veggies and low in animal meats, cheeses, and dairy  products, appropriate calorie intake  Patient Care Team: Copland, Skipper [place], MD as PCP - General (Family Medicine)  HISTORY OF PRESENT ILLNESS: 53 y.o. female with a past medical history listed below presents for evaluation of GERD.   Previously seen by Dr. Venice Gillis July 2021 for epigastric pain.  Discussed the use of AI scribe software for clinical note transcription with the patient, who gave verbal consent to proceed.  History of Present Illness   Margaret Davis is a 53 year old female with reflux who presents with a sensation of food not digesting and feeling stuck between her throat and esophagus. She was referred by Dr. Allison Arena for evaluation of her symptoms and management of reflux.  She experiences a sensation of food not digesting and feeling stuck between her throat and esophagus, occurring both during the day and at night. This issue was initially severe, then stabilized, but has worsened over the past week. She often wakes up with the feeling that food consumed the previous evening remains in her throat. No nausea, vomiting, or difficulty swallowing liquids, and the sensation is specific to solid food. She has been on pantoprazole  40 mg once daily since 2023, occasionally increasing to twice daily with some relief.  Her past medical history includes reflux, with an endoscopy in August 2021 showing stomach inflammation, and an abdominal ultrasound last year revealing fatty liver but a normal gallbladder. Her last colonoscopy in April 2021 was normal. She  has not experienced significant weight changes, though her clothes fit differently.  She reports occasional alcohol use and denies the use of NSAIDs, supplements, or weight loss medications. She started taking losartan  in November of the previous year. 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 She tries to listen to her body and avoid overeating, describing her sensation of fullness as normal.         She  reports that she has never smoked. She has never used smokeless tobacco. She reports that she does not drink alcohol and does not use drugs.  RELEVANT GI HISTORY, IMAGING AND LABS: Results   RADIOLOGY Abdominal ultrasound: Fatty liver, normal gallbladder (2024)  DIAGNOSTIC Endoscopy: Gastric inflammation, otherwise unremarkable (08/2019) Colonoscopy: Normal (04/2019)      CBC    Component Value Date/Time   WBC 5.0 06/12/2023 1146   RBC 4.41 06/12/2023 1146   HGB 12.5 06/12/2023 1146   HCT 38.6 06/12/2023 1146   PLT 311.0 06/12/2023 1146   MCV 87.5 06/12/2023 1146   MCH 30.3 11/04/2021 1729   MCHC 32.3 06/12/2023 1146   RDW 14.7 06/12/2023 1146   LYMPHSABS 1.5 09/06/2012 1225   MONOABS 0.5 09/06/2012 1225   EOSABS 0.2 09/06/2012 1225   BASOSABS 0.0 09/06/2012 1225   Recent Labs    12/07/22 1213 06/12/23 1146  HGB 12.7 12.5    CMP     Component Value Date/Time   NA 137 06/12/2023 1146   K 3.9 06/12/2023 1146   CL 103 06/12/2023 1146   CO2 29 06/12/2023 1146   GLUCOSE 98 06/12/2023 1146   BUN 10 06/12/2023 1146   CREATININE 0.76 06/12/2023 1146   CALCIUM 9.6 06/12/2023 1146   PROT 7.0 06/12/2023 1146   ALBUMIN 4.2 06/12/2023 1146   AST 13 06/12/2023 1146   ALT 10 06/12/2023 1146   ALKPHOS 72 06/12/2023 1146   BILITOT 0.3 06/12/2023 1146   GFRNONAA >60 11/04/2021 1729   GFRAA >90 09/06/2012 1225      Latest Ref Rng & Units 06/12/2023   11:46 AM 12/07/2022   12:13 PM 11/10/2021    3:27 PM  Hepatic Function  Total Protein 6.0 - 8.3 g/dL 7.0  7.2  7.6   Albumin 3.5 - 5.2 g/dL 4.2  4.3  4.6   AST 0 - 37 U/L 13  14  14    ALT 0 - 35 U/L 10  12  13    Alk Phosphatase 39 - 117 U/L 72  73  75   Total Bilirubin 0.2 - 1.2 mg/dL 0.3  0.5  0.4       Current Medications:    Current Outpatient Medications (Cardiovascular):    EPINEPHrine  (EPIPEN  2-PAK) 0.3 mg/0.3 mL IJ SOAJ injection, Inject 0.3 mg into the muscle as needed for anaphylaxis.    losartan -hydrochlorothiazide (HYZAAR) 50-12.5 MG tablet, Take 1 tablet by mouth daily.   simvastatin  (ZOCOR ) 20 MG tablet, TAKE 1 TABLET(20 MG) BY MOUTH AT BEDTIME  Current Outpatient Medications (Respiratory):    cetirizine (ZYRTEC) 10 MG tablet, Take 10 mg by mouth daily.   FLOVENT  HFA 44 MCG/ACT inhaler, Inhale 2 puffs into the lungs in the morning and at bedtime.   fluticasone  (FLONASE ) 50 MCG/ACT nasal spray, Place 2 sprays into both nostrils daily.  Current Outpatient Medications (Analgesics):    ibuprofen  (ADVIL ) 400 MG tablet,    Current Outpatient Medications (Other):    cholecalciferol (VITAMIN D ) 1000 UNITS tablet, Take 1,000 Units by mouth daily.  famotidine (PEPCID) 40 MG tablet, Take 1 tablet (40 mg total) by mouth at bedtime.   FLUoxetine  (PROZAC ) 20 MG capsule, Take 1 capsule (20 mg total) by mouth daily.   pantoprazole  (PROTONIX ) 40 MG tablet, Take 1 tablet (40 mg total) by mouth daily.  Medical History:  Past Medical History:  Diagnosis Date   Anxiety    Elevated cholesterol    Essential hypertension 12/25/2019   GERD (gastroesophageal reflux disease)    OCCASIONAL   Mass    RIGHT OVARY  --ABDOMINAL PAIN   Panic attacks    Sleep apnea    Syncope    PT STATES HER HEART AND BRAIN MISFIRE IF OVERLY HEATED OR UPSET--AND PT WOULD PASS OUT --NO EPISODES SINCE 2000  - NO MEDICATIONS--PT NO LONGER SEES CARDIOLOGIST   Allergies:  Allergies  Allergen Reactions   Orange Oil Hives    Patient reports she can drink OJ without a problem.   Latex Hives and Other (See Comments)    CONTACT WITH LATEX CAUSES HIVES   Omeprazole  Other (See Comments)    Abdominal pain   Other     CAN'T EAT ORANGES-BUT CAN DRINK OJ   Phenobarbital Other (See Comments)    AS ACHILD--THOUGHT TO BE HAVING SEIZURES--HAD ALLERGIC REACTION TO PHENOBARBITAL.   LATER TOLD SHE DID NOT HAVE SEIZURES     Surgical History:  She  has a past surgical history that includes Wisdom tooth extraction;  Abdominal hysterectomy (07/2011); Colonoscopy (04/2019); and Biopsy thyroid . Family History:  Her family history includes Colon polyps in her mother; Diabetes in her mother; Hypertension in her father.  REVIEW OF SYSTEMS  : All other systems reviewed and negative except where noted in the History of Present Illness.  PHYSICAL EXAM: BP 132/84   Pulse 85   Ht 5' 4 (1.626 m)   Wt 226 lb 9.6 oz (102.8 kg)   LMP 06/04/2011   BMI 38.90 kg/m  Physical Exam   GENERAL APPEARANCE: Well nourished, in no apparent distress. HEENT: No cervical lymphadenopathy, unremarkable thyroid , sclerae anicteric, conjunctiva pink. RESPIRATORY: Respiratory effort normal, breath sounds equal bilaterally without rales, rhonchi, or wheezing. Lungs clear to auscultation. CARDIO: Regular rate and rhythm with no murmurs, rubs, or gallops, peripheral pulses intact. ABDOMEN: Soft, non-distended, active bowel sounds in all four quadrants, no tenderness to palpation, no rebound, no mass appreciated. Discomfort in one area, no pain elsewhere. RECTAL: Declines. MUSCULOSKELETAL: Full range of motion, normal gait, without edema. SKIN: Dry, intact without rashes or lesions. No jaundice. NEURO: Alert, oriented, no focal deficits. PSYCH: Cooperative, normal mood and affect.      Edmonia Gottron, PA-C 11:42 AM

## 2023-06-16 ENCOUNTER — Encounter: Payer: Self-pay | Admitting: Physician Assistant

## 2023-06-16 ENCOUNTER — Ambulatory Visit (INDEPENDENT_AMBULATORY_CARE_PROVIDER_SITE_OTHER): Admitting: Physician Assistant

## 2023-06-16 VITALS — BP 132/84 | HR 85 | Ht 64.0 in | Wt 226.6 lb

## 2023-06-16 DIAGNOSIS — R1013 Epigastric pain: Secondary | ICD-10-CM

## 2023-06-16 DIAGNOSIS — K219 Gastro-esophageal reflux disease without esophagitis: Secondary | ICD-10-CM

## 2023-06-16 DIAGNOSIS — K760 Fatty (change of) liver, not elsewhere classified: Secondary | ICD-10-CM | POA: Diagnosis not present

## 2023-06-16 DIAGNOSIS — Z6838 Body mass index (BMI) 38.0-38.9, adult: Secondary | ICD-10-CM

## 2023-06-16 DIAGNOSIS — K581 Irritable bowel syndrome with constipation: Secondary | ICD-10-CM

## 2023-06-16 DIAGNOSIS — E669 Obesity, unspecified: Secondary | ICD-10-CM | POA: Diagnosis not present

## 2023-06-16 DIAGNOSIS — R1310 Dysphagia, unspecified: Secondary | ICD-10-CM

## 2023-06-16 DIAGNOSIS — R09A2 Foreign body sensation, throat: Secondary | ICD-10-CM

## 2023-06-16 MED ORDER — FAMOTIDINE 40 MG PO TABS: 40.0000 mg | ORAL_TABLET | Freq: Every day | ORAL | 0 refills | Status: DC

## 2023-06-16 NOTE — Patient Instructions (Addendum)
 Please take your proton pump inhibitor medication, pantoprazole  40 mg once daily, pepcid 40 mg at [facility]  Please take this medication 30 minutes to 1 hour before meals- this makes it more effective.  Avoid spicy and acidic foods Avoid fatty foods Limit your intake of coffee, tea, alcohol, and carbonated drinks Work to maintain a healthy weight Keep the head of the bed elevated at least 3 inches with blocks or a wedge pillow if you are having any nighttime symptoms Stay upright for 2 hours after eating Avoid meals and snacks three to four hours before bedtime  Reflux Gourmet Rescue  It is an ALGINATE THERAPY which is the only intervention that works to safeguard the esophagus by creating a protective barrier that actually stops reflux from happening.  -The general directions for use are as stated on the packaging: Take 1 teaspoon (5 ml), or more as needed or as directed by your physician, after meals and before bed.  -These general directions address the most common times for reflux to occur, but our Rescue products may be taken anytime. Some individuals may take our product preemptively, when they know they will suffer from reflux, or as needed - when discomfort arises. (If taken around food, it should be consumed last.)  -You do not have to take 1 teaspoon (5 ml) of the product. While one teaspoon (5ml) may be the perfect average amount to relieve reflux suffering in some, others may require more or less. You may adjust the amount of Mint Chocolate Rescue and Vanilla Caramel Rescue to the lowest amount necessary to meet your individual needs to improve your quality of life.  -You may dilute the product if it is too viscous for you to consume. Keep in mind, however, that the thickness of the product was formulated to provide optimal coating and protection of your throat and esophagus. Though diluting the product is possible, it may reduce the protective function and/or length of action.  -This can  be used in conjunction with reflux medications and lifestyle changes.  100% ALL-NATURAL  Paraben FREE, glycerin FREE, & potassium FREE  Made entirely from all-natural ingredients considered safe for children and during pregnancy  No known side effects  All-natural flavor Gluten FREE  Allergen FREE  Vegan  Can find more information here: NameSeizer.co.nz  Due to recent changes in healthcare laws, you may see the results of your imaging and laboratory studies on MyChart before your provider has had a chance to review them.  We understand that in some cases there may be results that are confusing or concerning to you. Not all laboratory results come back in the same time frame and the provider may be waiting for multiple results in order to interpret others.  Please give us  48 hours in order for your provider to thoroughly review all the results before contacting the office for clarification of your results.    I appreciate the  opportunity to care for you  Thank You   [facility]

## 2023-07-14 LAB — HM MAMMOGRAPHY

## 2023-07-14 NOTE — Progress Notes (Unsigned)
 [facility] [address] [place], [place] [address] [phone]  Date:  07/17/2023   Name:  YASIRA ENGELSON   DOB:  1970-07-01   MRN:  [id number]  PCP:  Watt Harlene BROCKS, MD    Chief Complaint: Sinus Problem   History of Present Illness:  JOHANNE MCGLADE is a 53 y.o. very pleasant female patient who presents with the following:  Patient seen today with concern of sinus infection.  Most recent visit with myself was in June for a skin tag.  Unfortunately due to its location on her eyelid I did request that she see her ophthalmologist to have the skin tag removed  History of essential hypertension, prediabetes, previous COVID-19 with persistent symptoms, hyperlipidemia, sleep apnea on CPAP, obesity, GERD, vitamin D  deficiency   Mammogram; just done last week-we have not received the results yet  In addition, pt notes she had a syncope episode 5 days ago while she was in a stressful situation.  She was sitting in the car (it was in park, she was waiting to pick up her son and she was on the phone having an unpleasant phone call).  Her son said that seemed to slump over and could not hear him calling to her.  She seemed to come to after a few seconds to a minute.  She is not sure how long she was out but does not think it was long No incontinence  She has felt ok since then-no further symptoms She is taking her fluoxetine  20 mg twice a day- she increased from 20 mg daily after this episode thinking it might be stress related.  She is tolerating the higher dosage fine and thinks it may be helping her.  She knows there is been a situation in her job that has been very stressful and upsetting She has not experienced chest pain or shortness of breath  She has had episodes like this before -pt noes her most recent episode like this was 11 years ago In her youth she saw neurology and cardiology - they did a tilt table  and she was diagnosed with postural syncope This all occurred when she was in her mid 34s.    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 12/19/2019   Ageusia 12/19/2019   Anosmia 12/19/2019   Olfactory impairment 09/10/2019   Hyperlipidemia 02/14/2019   OSA on CPAP 02/04/2013   Obesity (BMI 30-39.9) 02/04/2013   Syncope    GERD (gastroesophageal reflux disease)    Panic attacks     Past Medical History:  Diagnosis Date   Anxiety    Elevated cholesterol    Essential hypertension 12/25/2019   GERD (gastroesophageal reflux disease)    OCCASIONAL   Mass    RIGHT OVARY  --ABDOMINAL PAIN   Panic attacks    Sleep apnea    Syncope    PT STATES HER HEART AND BRAIN MISFIRE IF OVERLY HEATED OR UPSET--AND PT WOULD PASS  OUT --NO EPISODES SINCE 2000  - NO MEDICATIONS--PT NO LONGER SEES CARDIOLOGIST    Past Surgical History:  Procedure Laterality Date   ABDOMINAL HYSTERECTOMY  07/2011   partical   BIOPSY THYROID      COLONOSCOPY  04/2019   WISDOM TOOTH EXTRACTION      Social History   Tobacco Use   Smoking status: Never   Smokeless tobacco: Never  Vaping Use   Vaping status: Never Used  Substance Use Topics   Alcohol use: No    Comment: rarely   Drug use: No    Family History  Problem Relation Age of Onset   Diabetes Mother    Colon polyps Mother        benign   Hypertension Father    Colon cancer Neg Hx    Esophageal cancer Neg Hx     Allergies  Allergen Reactions   Orange Oil Hives    Patient reports she can drink OJ  without a problem.   Latex Hives and Other (See Comments)    CONTACT WITH LATEX CAUSES HIVES   Omeprazole  Other (See Comments)    Abdominal pain   Other     CAN'T EAT ORANGES-BUT CAN DRINK OJ   Phenobarbital Other (See Comments)    AS ACHILD--THOUGHT TO BE HAVING SEIZURES--HAD ALLERGIC REACTION TO PHENOBARBITAL.   LATER TOLD SHE DID NOT HAVE SEIZURES    Medication list has been reviewed and updated.  Current Outpatient Medications on File Prior to Visit  Medication Sig Dispense Refill   cetirizine (ZYRTEC) 10 MG tablet Take 10 mg by mouth daily.     cholecalciferol (VITAMIN D ) 1000 UNITS tablet Take 2,000 Units by mouth daily.     EPINEPHrine  (EPIPEN  2-PAK) 0.3 mg/0.3 mL IJ SOAJ injection Inject 0.3 mg into the muscle as needed for anaphylaxis. 1 each prn   famotidine  (PEPCID ) 40 MG tablet Take 1 tablet (40 mg total) by mouth at bedtime. 90 tablet 0   fluticasone  (FLONASE ) 50 MCG/ACT nasal spray Place 2 sprays into both nostrils daily. 16 g 6   losartan -hydrochlorothiazide (HYZAAR) 50-12.5 MG tablet Take 1 tablet by mouth daily. 90 tablet 3   pantoprazole  (PROTONIX ) 40 MG tablet Take 1 tablet (40 mg total) by mouth daily. 90 tablet 1   simvastatin  (ZOCOR ) 20 MG tablet TAKE 1 TABLET(20 MG) BY MOUTH AT BEDTIME 90 tablet 3   No current facility-administered medications on file prior to visit.    Review of Systems:  As per HPI- otherwise negative.   Physical Examination: Vitals:   07/17/23 0823 07/17/23 0855  BP: (!) 130/98 (!) 120/90  Pulse: (!) 102 83  Temp: 98.3 F (36.8 C)   SpO2: 98%    Vitals:   07/17/23 0823  Weight: 223 lb (101.2 kg)  Height: 5' 4 (1.626 m)   Body mass index is 38.28 kg/m. Ideal Body Weight: Weight in (lb) to have BMI = 25: 145.3  GEN: no acute distress.  Obese, weight stable.  Looks well HEENT: Atraumatic, Normocephalic.  The left nasal cavity is inflamed.  She has some tenderness of the left sinuses.  Otherwise ENT exam is normal She is  concerned she may be developing a cold sore on her left lower lip, they are going to [place]  in 2 days and she would like some Valtrex  Ears and Nose: No external deformity. CV: RRR, No M/G/R. No JVD. No thrill. No extra heart sounds. PULM: CTA B, no wheezes, crackles, rhonchi. No retractions. No resp. distress.  No accessory muscle use. ABD: S, NT, ND EXTR: No c/c/e PSYCH: Normally interactive. Conversant.  Normal strength, sensation, DTR of all limbs.  Normal facial movement and sensation.  Normal gait  EKG: Sinus rhythm, rate in the mid 80s. Compared with the tracing taken in March of this year no significant changes noted Assessment and Plan: Syncope, unspecified syncope type - Plan: EKG 12-Lead  Acute non-recurrent frontal sinusitis - Plan: amoxicillin  (AMOXIL ) 875 MG tablet  History of cold sores - Plan: valACYclovir  (VALTREX ) 1000 MG tablet  Panic attacks - Plan: FLUoxetine  (PROZAC ) 40 MG capsule  Patient seen today with concern of sinusitis symptoms for about 10 days.  Will treat her with amoxicillin -she will let me know if not responding as expected Possible developing cold sore, refilled Valtrex  for her to have on hand Patient notes history of postural syncope, most recent syncopal episode last week; prior to that 14 years ago.  She has been feeling back to normal in this regard since the episode.  We wonder if what she experienced may have been more of a panic attack.  She has increased her fluoxetine  from 20 to 40 mg daily and feels that this is helping her, I updated her prescription today.  Advised her I will also reach out to her neurologist and cardiologist about any further testing they think may be needed and if they feel driving should be restricted.  For the time being I did not tell her to stop driving but did caution her to not drive if she is not feeling well.  Will set up a Zio patch to rule out arrhythmia    Signed Harlene Schroeder, MD

## 2023-07-16 ENCOUNTER — Other Ambulatory Visit: Payer: Self-pay | Admitting: Family Medicine

## 2023-07-16 DIAGNOSIS — I10 Essential (primary) hypertension: Secondary | ICD-10-CM

## 2023-07-16 DIAGNOSIS — F411 Generalized anxiety disorder: Secondary | ICD-10-CM

## 2023-07-17 ENCOUNTER — Other Ambulatory Visit: Payer: Self-pay

## 2023-07-17 ENCOUNTER — Ambulatory Visit: Attending: Family Medicine

## 2023-07-17 ENCOUNTER — Ambulatory Visit: Admitting: Family Medicine

## 2023-07-17 VITALS — BP 120/90 | HR 83 | Temp 98.3°F | Ht 64.0 in | Wt 223.0 lb

## 2023-07-17 DIAGNOSIS — Z8619 Personal history of other infectious and parasitic diseases: Secondary | ICD-10-CM

## 2023-07-17 DIAGNOSIS — R55 Syncope and collapse: Secondary | ICD-10-CM

## 2023-07-17 DIAGNOSIS — J0110 Acute frontal sinusitis, unspecified: Secondary | ICD-10-CM | POA: Diagnosis not present

## 2023-07-17 DIAGNOSIS — F410 Panic disorder [episodic paroxysmal anxiety] without agoraphobia: Secondary | ICD-10-CM

## 2023-07-17 MED ORDER — FLUOXETINE HCL 40 MG PO CAPS: 40.0000 mg | ORAL_CAPSULE | Freq: Every day | ORAL | 3 refills | Status: AC

## 2023-07-17 MED ORDER — VALACYCLOVIR HCL 1 G PO TABS: ORAL_TABLET | ORAL | 0 refills | Status: DC

## 2023-07-17 MED ORDER — AMOXICILLIN 875 MG PO TABS: 875.0000 mg | ORAL_TABLET | Freq: Two times a day (BID) | ORAL | 0 refills | Status: DC

## 2023-07-17 NOTE — Progress Notes (Unsigned)
 EP to read.

## 2023-07-17 NOTE — Patient Instructions (Signed)
 It was good to see you again today I will set up a heart monitor for you and will reach out to your neurologist and cardiologist about your possible recent syncope.  Please do let me know if this should happen again   We will treat you for a possible sinus infection with amoxicillin  Have a great time in [place] !

## 2023-07-28 ENCOUNTER — Encounter: Payer: Self-pay | Admitting: Family Medicine

## 2023-07-28 DIAGNOSIS — K219 Gastro-esophageal reflux disease without esophagitis: Secondary | ICD-10-CM

## 2023-07-29 MED ORDER — PANTOPRAZOLE SODIUM 40 MG PO TBEC: 40.0000 mg | DELAYED_RELEASE_TABLET | Freq: Every day | ORAL | 3 refills | Status: AC

## 2023-07-31 ENCOUNTER — Telehealth: Payer: Self-pay | Admitting: Gastroenterology

## 2023-07-31 NOTE — Telephone Encounter (Signed)
 Dr. Charlanne please see office visit note with PCP on 07/17/23 regarding syncopal episode and Zio patch heart monitor. Please advise if it is okay to proceed with EGD on 08/01/23 or if it needs to be rescheduled.

## 2023-07-31 NOTE — Telephone Encounter (Signed)
 Needs cardio clearence (recent syncope) Can proceed with Ba sSwallow for now RG

## 2023-07-31 NOTE — Telephone Encounter (Signed)
 Pt made aware of Dr. Charlanne recommendations.  Pt stated that she will wear the heart monitor until Sunday Procedure was canceled.  Pt was  made aware of Dr. Charlanne recommendations.  Pt was notified that I will call her tomorrow morning and get her scheduled for the Barium swallow and discuss Cardiac Clearance.  Pt verbalized understanding with all questions answered.

## 2023-07-31 NOTE — Telephone Encounter (Signed)
 Patient scheduled for procedure for tomorrow. Inquiring if her heart monitor she is wearing is ok. Please advise.

## 2023-08-01 ENCOUNTER — Other Ambulatory Visit: Payer: Self-pay

## 2023-08-01 ENCOUNTER — Encounter: Admitting: Gastroenterology

## 2023-08-01 DIAGNOSIS — R1310 Dysphagia, unspecified: Secondary | ICD-10-CM

## 2023-08-01 DIAGNOSIS — K219 Gastro-esophageal reflux disease without esophagitis: Secondary | ICD-10-CM

## 2023-08-01 NOTE — Telephone Encounter (Signed)
 Pt was ordered and scheduled for the barium swallow test on 08/18/2023 at 11:00 AM at [facility]. Arrive 15 minutes early, No food or drink 3 hours prior to the test. Pt made aware.  Pt verbalized understanding with all questions answered.  Reminder placed in Epic for 08/19/2023 to follow up on results and EGD/ Cardiac Clearance.

## 2023-08-17 DIAGNOSIS — R55 Syncope and collapse: Secondary | ICD-10-CM

## 2023-08-18 ENCOUNTER — Other Ambulatory Visit: Payer: Self-pay | Admitting: Family Medicine

## 2023-08-18 ENCOUNTER — Ambulatory Visit (HOSPITAL_COMMUNITY)

## 2023-08-18 ENCOUNTER — Encounter: Payer: Self-pay | Admitting: Family Medicine

## 2023-08-18 DIAGNOSIS — I472 Ventricular tachycardia, unspecified: Secondary | ICD-10-CM

## 2023-08-18 DIAGNOSIS — R55 Syncope and collapse: Secondary | ICD-10-CM

## 2023-08-18 NOTE — Addendum Note (Signed)
 Addended by: WATT RAISIN C on: 08/18/2023 02:20 PM   Modules accepted: Orders

## 2023-08-21 ENCOUNTER — Telehealth: Payer: Self-pay

## 2023-08-21 NOTE — Telephone Encounter (Signed)
 Dr. Elmira advised to schedule appointment with him within the next few weeks. Left message for patient to call back and will also send mychart message.

## 2023-08-21 NOTE — Telephone Encounter (Signed)
 Left message for pt to call back

## 2023-08-22 NOTE — Telephone Encounter (Signed)
 Unable to reach pt nor leave a voice message due to voice mailbox being full at this time.

## 2023-08-23 NOTE — Telephone Encounter (Signed)
 Pt stated that she had to reschedule her Barium Swallow and its scheduled on Oct 2nd.  Pt stated that she is scheduled to see her Cardiologist this Friday.  Reminder was placed in epic to follow up next week.  Pt verbalized understanding with all questions answered.

## 2023-08-25 ENCOUNTER — Ambulatory Visit: Attending: Cardiology | Admitting: Cardiology

## 2023-08-25 ENCOUNTER — Encounter: Payer: Self-pay | Admitting: Cardiology

## 2023-08-25 VITALS — BP 128/86 | HR 83 | Ht 64.0 in | Wt 225.6 lb

## 2023-08-25 DIAGNOSIS — I10 Essential (primary) hypertension: Secondary | ICD-10-CM

## 2023-08-25 DIAGNOSIS — R55 Syncope and collapse: Secondary | ICD-10-CM | POA: Diagnosis not present

## 2023-08-25 NOTE — Progress Notes (Signed)
 Cardiology Office Note:  .   Date:  08/25/2023  ID:  Margaret Davis [place], DOB [birth year], MRN [id number] PCP: Watt Harlene BROCKS, MD  [facility] Providers Cardiologist:  Newman Lawrence, MD PCP: Watt Harlene BROCKS, MD  Chief Complaint  Patient presents with   Loss of Consciousness    07/12/2023     Margaret Davis is a 53 y.o. female with hypertension, OSA on CPAP, vasovagal syncope  Discussed the use of AI scribe software for clinical note transcription with the patient, who gave verbal consent to proceed.  History of Present Illness Margaret Davis is a 53 year old female with vasovagal syncope who presents with a recent episode of syncope.  She experienced a syncope episode while sitting in her car after a stressful phone call. She felt lightheaded and heard her son calling her name but does not remember losing consciousness. The episode was brief.  She has vasovagal syncope with episodes occurring very intermittently, the last being ten years ago. Previous episodes were associated with stress. A tilt table test with adrenaline confirmed the diagnosis.  During the recent episode, she felt lightheaded without chest pain or palpitations. She maintains adequate hydration, drinking from a 32-ounce Yeti cup throughout the day.  She is on losartan  hydrochlorothiazide 50/12.5 mg for elevated blood pressure, prescribed in May. She was on this medication at the time of the syncope episode and has not experienced any other episodes since.  She uses a CPAP machine for sleep apnea, although she occasionally forgets to use it, and reports snoring at night.      Vitals:   08/25/23 1318  BP: 128/86  Pulse: 83  SpO2: 97%      Review of Systems  Cardiovascular:  Positive for syncope. Negative for chest pain, dyspnea on exertion, leg swelling and palpitations.        Studies Reviewed: SABRA        EKG 07/17/2023: Sinus rhythm 84 bpm Borderline LVH  Zio monitor 14  days 08/2023: HR 50 to 127, average 74. 1 nonsustained VT lasting 6 beats Rare supraventricular and ventricular ectopy. No sustained arrhythmias. No atrial fibrillation.   Labs 06/2023: Chol 214, TG 79, HDL 76, LDL 122 HbA1C 5.6% Hb 12.5 Cr 0.76 TSH 1.1   Physical Exam Vitals and nursing note reviewed.  Constitutional:      General: She is not in acute distress. Neck:     Vascular: No JVD.  Cardiovascular:     Rate and Rhythm: Normal rate and regular rhythm.     Heart sounds: Normal heart sounds. No murmur heard. Pulmonary:     Effort: Pulmonary effort is normal.     Breath sounds: Normal breath sounds. No wheezing or rales.  Musculoskeletal:     Right lower leg: No edema.     Left lower leg: No edema.      VISIT DIAGNOSES:   ICD-10-CM   1. Vasovagal syncope  R55     2. Primary hypertension  I10        Margaret Davis is a 53 y.o. female with hypertension, OSA on CPAP, vasovagal syncope  Assessment & Plan Vasovagal syncope: Intermittent episodes, stress and heat as triggers. Previous tilt table test confirmed diagnosis. Recent heart monitor showed only brief 6 beat NSVT, no arrhythmia to explain her syncope.  Syncope likely stress-induced, no loop recorder needed. Caution advised with driving. - Provide illustration on counterpressure maneuvers. - Advise adequate hydration. - Consider compression stockings. - Encourage caution  while driving, advise pulling over if lightheaded. - No need for repeat echocardiogram or CT scan.  Hypertension: Blood pressure slightly elevated. No new antihypertensive due to recent syncope. On losartan -hydrochlorothiazide 50/12.5 mg since May. Syncope occurred on this medication, no further episodes reported. - Continue losartan -hydrochlorothiazide 50/12.5 mg. - Monitor blood pressure regularly.        F/u in 3 months  Signed, Newman [name], MD

## 2023-08-25 NOTE — Patient Instructions (Addendum)
   Follow-Up: At [facility], you and your health needs are our priority.  As part of our continuing mission to provide you with exceptional heart care, our providers are all part of one team.  This team includes your primary Cardiologist (physician) and Advanced Practice Providers or APPs (Physician Assistants and Nurse Practitioners) who all work together to provide you with the care you need, when you need it.  Your next appointment:   3 month(s)  Provider:   Newman [name], MD    We recommend signing up for the patient portal called MyChart.  Sign up information is provided on this After Visit Summary.  MyChart is used to connect with patients for Virtual Visits (Telemedicine).  Patients are able to view lab/test results, encounter notes, upcoming appointments, etc.  Non-urgent messages can be sent to your provider as well.   To learn more about what you can do with MyChart, go to [email or web address].   Other Instructions    Counter pressure maneuvers

## 2023-08-30 ENCOUNTER — Telehealth: Payer: Self-pay

## 2023-08-30 NOTE — Telephone Encounter (Signed)
 Pt stated that she had her office visit follow up with her cardiologist and stated tat she thought everything went well. No further text ordered. Pt was notified that I would request medical clearance from her Cardiologist and once received in would call her back and get her scheduled for her Upper Endoscopy.  Medical Clearance requested through Epic.  Pt verbalized understanding with all questions answered.

## 2023-08-30 NOTE — Telephone Encounter (Signed)
 [facility] Pre-operative Risk Assessment     Request for surgical clearance:     Endoscopy Procedure  What type of surgery is being performed? Upper Endoscopy   When is this surgery scheduled?   TBD  What type of clearance is required ?   Medical   Are there any medications that need to be held prior to surgery and how long? None   Practice name and name of physician performing surgery?      [facility]/Dr. Charlanne  What is your office phone and fax number?      Phone- [phone]  Fax- [phone]  Anesthesia type (None, local, MAC, general) ?       MAC   Please route your response to Elspeth Munroe RN

## 2023-08-30 NOTE — Telephone Encounter (Signed)
   Patient Name: Margaret Davis  DOB: 1970-08-31 MRN: [id number]  Primary Cardiologist: Newman [name], MD  Chart reviewed as part of pre-operative protocol coverage. Given past medical history and time since last visit, based on ACC/AHA guidelines, Margaret Davis is at acceptable risk for the planned procedure without further cardiovascular testing.   The patient was advised that if she develops new symptoms prior to surgery to contact our office to arrange for a follow-up visit, and she verbalized understanding.  I will route this recommendation to the requesting party via Epic fax function and remove from pre-op pool.  Please call with questions.  Wyn Raddle, Jackee Shove, NP 08/30/2023, 5:23 PM

## 2023-08-30 NOTE — Telephone Encounter (Signed)
 No cardiac risk, okay to proceed.  Thanks MJP

## 2023-08-30 NOTE — Telephone Encounter (Signed)
 Good Morning Dr. Patwarhdan  We have received a surgical clearance request for Margaret Davis for endoscopy procedure.. They were seen recently in clinic on 08/25/2023. Can you please comment on surgical clearance for her upcoming procedure. Please forward you guidance and recommendations to P CV DIV PREOP

## 2023-09-08 NOTE — Telephone Encounter (Signed)
 Cardiac clearance received (phone note 08/30/23). Called patient to reschedule EGD, unable to reach VM box full.

## 2023-09-11 NOTE — Telephone Encounter (Signed)
 Pt made aware of Cardiac Clearance that was received,  Pt was scheduled to have the EGD in the [facility] on 10/24/2023 at 2:30 PM.  Pt was scheduled for the previsit on 09/27/2023 at 3:30 AM.  Pt wanted to confirm with Dr. Charlanne that she is to proceed with the Barrium Swallow test on 10/05/2023 prior to the scheduled EGD.  Please review and advise.

## 2023-09-14 ENCOUNTER — Other Ambulatory Visit: Payer: Self-pay | Admitting: Physician Assistant

## 2023-09-17 ENCOUNTER — Encounter: Payer: Self-pay | Admitting: Family Medicine

## 2023-09-18 ENCOUNTER — Other Ambulatory Visit: Payer: Self-pay | Admitting: Family Medicine

## 2023-09-18 DIAGNOSIS — Z8249 Family history of ischemic heart disease and other diseases of the circulatory system: Secondary | ICD-10-CM

## 2023-09-20 ENCOUNTER — Other Ambulatory Visit (INDEPENDENT_AMBULATORY_CARE_PROVIDER_SITE_OTHER)

## 2023-09-20 DIAGNOSIS — Z8249 Family history of ischemic heart disease and other diseases of the circulatory system: Secondary | ICD-10-CM

## 2023-09-25 ENCOUNTER — Encounter: Payer: Self-pay | Admitting: Family Medicine

## 2023-09-25 LAB — FACTOR 5 LEIDEN: Result: NEGATIVE

## 2023-09-27 ENCOUNTER — Encounter

## 2023-09-27 NOTE — Telephone Encounter (Signed)
 Please proceed with barium swallow as scheduled prior to EGD. RG

## 2023-09-27 NOTE — Telephone Encounter (Signed)
 Please review notes below.  Please advise if pt is to proceed with Barium Swallow prior to EGD.  Barium swallow scheduled on 10/05/2023 Previsit appointment scheduled on 10/13/2023  EGD scheduled on 10/24/2023   Please review and advise

## 2023-09-27 NOTE — Telephone Encounter (Signed)
 Margaret Davis,   Can you assist with this. Pt r/s her PV today she is wanting to know if she needs to complete the barium swallow test and if not can someone cancel it. From her understanding she did not need both it was one or the other.

## 2023-09-27 NOTE — Telephone Encounter (Signed)
 Pt made aware of Dr. Charlanne recommendations.

## 2023-10-05 ENCOUNTER — Ambulatory Visit (HOSPITAL_COMMUNITY)
Admission: RE | Admit: 2023-10-05 | Discharge: 2023-10-05 | Disposition: A | Discharge status: Home / Self Care | Source: Ambulatory Visit | Attending: Gastroenterology | Admitting: Gastroenterology

## 2023-10-05 DIAGNOSIS — K219 Gastro-esophageal reflux disease without esophagitis: Secondary | ICD-10-CM | POA: Diagnosis present

## 2023-10-05 DIAGNOSIS — R1310 Dysphagia, unspecified: Secondary | ICD-10-CM | POA: Insufficient documentation

## 2023-10-07 ENCOUNTER — Ambulatory Visit: Payer: Self-pay | Admitting: Gastroenterology

## 2023-10-13 ENCOUNTER — Ambulatory Visit (AMBULATORY_SURGERY_CENTER)

## 2023-10-13 VITALS — Ht 64.0 in | Wt 215.0 lb

## 2023-10-13 DIAGNOSIS — K219 Gastro-esophageal reflux disease without esophagitis: Secondary | ICD-10-CM

## 2023-10-13 NOTE — Progress Notes (Signed)
 No egg or soy allergy known to patient  No issues known to pt with past sedation with any surgeries or procedures Patient denies ever being told they had issues or difficulty with intubation  No FH of Malignant Hyperthermia Pt is not on diet pills Pt is not on  home 02  Pt is not on blood thinners  No A fib or A flutter Have any cardiac testing pending-- no  LOA: independent    Patient's chart reviewed by Rogena Class CNRA prior to previsit and patient appropriate for the [facility].  Previsit completed and red dot placed by patient's name on their procedure day (on provider's schedule).     PV completed with patient. Prep instructions sent via mychart and home address.

## 2023-10-20 ENCOUNTER — Encounter: Payer: Self-pay | Admitting: Gastroenterology

## 2023-10-20 ENCOUNTER — Other Ambulatory Visit (HOSPITAL_BASED_OUTPATIENT_CLINIC_OR_DEPARTMENT_OTHER): Payer: Self-pay

## 2023-10-20 ENCOUNTER — Other Ambulatory Visit (HOSPITAL_COMMUNITY): Payer: Self-pay

## 2023-10-20 MED ORDER — FLUZONE 0.5 ML IM SUSY
0.5000 mL | PREFILLED_SYRINGE | Freq: Once | INTRAMUSCULAR | 0 refills | Status: AC
  Filled 2023-10-20: qty 0.5, 1d supply, fill #0

## 2023-10-24 ENCOUNTER — Ambulatory Visit: Admitting: Gastroenterology

## 2023-10-24 ENCOUNTER — Other Ambulatory Visit: Payer: Self-pay | Admitting: Family Medicine

## 2023-10-24 ENCOUNTER — Encounter: Payer: Self-pay | Admitting: Gastroenterology

## 2023-10-24 ENCOUNTER — Other Ambulatory Visit (HOSPITAL_BASED_OUTPATIENT_CLINIC_OR_DEPARTMENT_OTHER): Payer: Self-pay

## 2023-10-24 VITALS — BP 139/94 | HR 84 | Temp 97.3°F | Resp 14 | Ht 64.0 in | Wt 215.0 lb

## 2023-10-24 DIAGNOSIS — K219 Gastro-esophageal reflux disease without esophagitis: Secondary | ICD-10-CM

## 2023-10-24 DIAGNOSIS — R1310 Dysphagia, unspecified: Secondary | ICD-10-CM

## 2023-10-24 DIAGNOSIS — K2950 Unspecified chronic gastritis without bleeding: Secondary | ICD-10-CM

## 2023-10-24 DIAGNOSIS — E782 Mixed hyperlipidemia: Secondary | ICD-10-CM

## 2023-10-24 MED ORDER — SODIUM CHLORIDE 0.9 % IV SOLN: 500.0000 mL | Freq: Once | INTRAVENOUS | Status: DC

## 2023-10-24 NOTE — Op Note (Signed)
 [facility] Patient Name: Margaret Davis Procedure Date: 10/24/2023 3:20 PM MRN: [id number] Endoscopist: Lynnie Bring , MD, [id number] Age: 53 Referring MD:  Date of Birth: 1970-05-24 Gender: Female Account #: [id number] Procedure:                Upper GI endoscopy Indications:              Dysphagia with negative barium swallow. Medicines:                Monitored Anesthesia Care Procedure:                Pre-Anesthesia Assessment:                           - Prior to the procedure, a History and Physical                            was performed, and patient medications and                            allergies were reviewed. The patient's tolerance of                            previous anesthesia was also reviewed. The risks                            and benefits of the procedure and the sedation                            options and risks were discussed with the patient.                            All questions were answered, and informed consent                            was obtained. Prior Anticoagulants: The patient has                            taken no anticoagulant or antiplatelet agents. ASA                            Grade Assessment: II - A patient with mild systemic                            disease. After reviewing the risks and benefits,                            the patient was deemed in satisfactory condition to                            undergo the procedure.                           After obtaining informed consent, the endoscope was  passed under direct vision. Throughout the                            procedure, the patient's blood pressure, pulse, and                            oxygen saturations were monitored continuously. The                            GIF [id number] #[id number] was introduced through the                            mouth, and advanced to the second part of duodenum.                            The upper GI  endoscopy was accomplished without                            difficulty. The patient tolerated the procedure                            well. Scope In: Scope Out: Findings:                 The esophagus was normal. No endoscopic abnormality                            was evident in the esophagus to explain the                            patient's complaint of dysphagia. It was decided,                            however, to proceed with dilation of the entire                            esophagus. The scope was withdrawn. Dilation was                            performed with a Maloney dilator with mild                            resistance at 50 Fr. Biopsies were obtained from                            the proximal and distal esophagus with cold forceps                            for histology of suspected eosinophilic esophagitis.                           The Z-line was regular and was found 36 cm from the  incisors.                           The entire examined stomach was normal. Biopsies                            were taken with a cold forceps for histology.                           The examined duodenum was normal. Complications:            No immediate complications. Estimated Blood Loss:     Estimated blood loss: none. Impression:               - Normal EGD                           - S/P empiric esophageal dilatation. Recommendation:           - Patient has a contact number available for                            emergencies. The signs and symptoms of potential                            delayed complications were discussed with the                            patient. Return to normal activities tomorrow.                            Written discharge instructions were provided to the                            patient.                           - Postdilatation diet.                           - Continue present medications including Protonix                              40 mg every morning and Pepcid  20 mg p.o. nightly.                            In 4 weeks, then stop taking Pepcid  at night and                            continue Protonix  40 mg p.o. q AM.                           - Await pathology results.                           - FU if still with problems. Recommend to control  her symptoms with minimum meds.                           - The findings and recommendations were discussed                            with the patient's family. Lynnie Bring, MD 10/24/2023 3:39:03 PM This report has been signed electronically.

## 2023-10-24 NOTE — Progress Notes (Signed)
 Sedate, gd SR, tolerated procedure well, VSS, report to RN

## 2023-10-24 NOTE — Progress Notes (Signed)
 06/16/2023 Margaret Davis [place] [id number] Nov 20, 1970   Referring provider: Watt Harlene BROCKS, MD Primary GI doctor: Dr. Charlanne   ASSESSMENT AND PLAN:  Epigastric pain/GERD with feeling of food stuck in her esopaghus 08/16/2019 EGD unremarkable, minimal gastritis AB US  05/27/2022 unremarkable other than fatty liver 07/2022 CT AB and pelvis without contrast fatty liver No NSAIDS, no ETOH - continue the pantoprazole  40 mg daily, pepcid  at night -alginate therapy given -Lifestyle changes discussed, avoid NSAIDS, ETOH, hand out given to the patient -Weight loss discussed with the patient -Schedule EGD at [facility] to evaluate GERd, esophagitis, hiatal hernia,I discussed risks of EGD with patient today, including risk of sedation, bleeding or perforation. Patient provides understanding and gave verbal consent to proceed. -Consider barium swallow -Consider GES, gastroparesis diet given   IBS constipation Colonoscopy Dr Rollin unremarkable 04/2019 recall 10 years Controlled with diet   Fatty liver seen on abdominal ultrasound 2021 Unremarkable LFTs, recent normal ultrasound Fatty liver 05/2022     Latest Ref Rng & Units 06/12/2023   11:46 AM 12/07/2022   12:13 PM 11/10/2021    3:27 PM  Hepatic Function  Total Protein 6.0 - 8.3 g/dL 7.0  7.2  7.6   Albumin 3.5 - 5.2 g/dL 4.2  4.3  4.6   AST 0 - 37 U/L 13  14  14    ALT 0 - 35 U/L 10  12  13    Alk Phosphatase 39 - 117 U/L 72  73  75   Total Bilirubin 0.2 - 1.2 mg/dL 0.3  0.5  0.4    Platelets 311.0  - need LFTs and CBC monitored every 6 months, - evaluation with imaging every 2-3 years.  -Continue to work on risk factor modification including diet exercise and control of risk factors including blood sugars.   Obesity  Body mass index is 38.9 kg/m.  -Patient has been advised to make an attempt to improve diet and exercise patterns to aid in weight loss. -Recommended diet heavy in fruits and veggies and low in animal meats, cheeses, and  dairy products, appropriate calorie intake   Patient Care Team: Copland, Harlene BROCKS, MD as PCP - General (Family Medicine)   HISTORY OF PRESENT ILLNESS: 53 y.o. female with a past medical history listed below presents for evaluation of GERD.    Previously seen by Dr. Charlanne July 2021 for epigastric pain.   Discussed the use of AI scribe software for clinical note transcription with the patient, who gave verbal consent to proceed.   History of Present Illness   Margaret Davis is a 53 year old female with reflux who presents with a sensation of food not digesting and feeling stuck between her throat and esophagus. She was referred by Dr. Ubaldo for evaluation of her symptoms and management of reflux.   She experiences a sensation of food not digesting and feeling stuck between her throat and esophagus, occurring both during the day and at night. This issue was initially severe, then stabilized, but has worsened over the past week. She often wakes up with the feeling that food consumed the previous evening remains in her throat. No nausea, vomiting, or difficulty swallowing liquids, and the sensation is specific to solid food. She has been on pantoprazole  40 mg once daily since 2023, occasionally increasing to twice daily with some relief.   Her past medical history includes reflux, with an endoscopy in August 2021 showing stomach inflammation, and an abdominal ultrasound last year revealing fatty  liver but a normal gallbladder. Her last colonoscopy in April 2021 was normal. She has not experienced significant weight changes, though her clothes fit differently.   She reports occasional alcohol use and denies the use of NSAIDs, supplements, or weight loss medications. She started taking losartan  in November of the previous year. 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 She tries to listen to her body and avoid overeating, describing her sensation of fullness as  normal.           She  reports that she has never smoked. She has never used smokeless tobacco. She reports that she does not drink alcohol and does not use drugs.   RELEVANT GI HISTORY, IMAGING AND LABS: Results   RADIOLOGY Abdominal ultrasound: Fatty liver, normal gallbladder (2024)   DIAGNOSTIC Endoscopy: Gastric inflammation, otherwise unremarkable (08/2019) Colonoscopy: Normal (04/2019)       CBC Labs (Brief)          Component Value Date/Time    WBC 5.0 06/12/2023 1146    RBC 4.41 06/12/2023 1146    HGB 12.5 06/12/2023 1146    HCT 38.6 06/12/2023 1146    PLT 311.0 06/12/2023 1146    MCV 87.5 06/12/2023 1146    MCH 30.3 11/04/2021 1729    MCHC 32.3 06/12/2023 1146    RDW 14.7 06/12/2023 1146    LYMPHSABS 1.5 09/06/2012 1225    MONOABS 0.5 09/06/2012 1225    EOSABS 0.2 09/06/2012 1225    BASOSABS 0.0 09/06/2012 1225      Recent Labs (within last 365 days)      Recent Labs    12/07/22 1213 06/12/23 1146  HGB 12.7 12.5        CMP     Labs (Brief)          Component Value Date/Time    NA 137 06/12/2023 1146    K 3.9 06/12/2023 1146    CL 103 06/12/2023 1146    CO2 29 06/12/2023 1146    GLUCOSE 98 06/12/2023 1146    BUN 10 06/12/2023 1146    CREATININE 0.76 06/12/2023 1146    CALCIUM 9.6 06/12/2023 1146    PROT 7.0 06/12/2023 1146    ALBUMIN 4.2 06/12/2023 1146    AST 13 06/12/2023 1146    ALT 10 06/12/2023 1146    ALKPHOS 72 06/12/2023 1146    BILITOT 0.3 06/12/2023 1146    GFRNONAA >60 11/04/2021 1729    GFRAA >90 09/06/2012 1225          Latest Ref Rng & Units 06/12/2023   11:46 AM 12/07/2022   12:13 PM 11/10/2021    3:27 PM  Hepatic Function  Total Protein 6.0 - 8.3 g/dL 7.0  7.2  7.6   Albumin 3.5 - 5.2 g/dL 4.2  4.3  4.6   AST 0 - 37 U/L 13  14  14    ALT 0 - 35 U/L 10  12  13    Alk Phosphatase 39 - 117 U/L 72  73  75   Total Bilirubin 0.2 - 1.2 mg/dL 0.3  0.5  0.4       Current Medications:      Current Outpatient Medications  (Cardiovascular):    EPINEPHrine  (EPIPEN  2-PAK) 0.3 mg/0.3 mL IJ SOAJ injection, Inject 0.3 mg into the muscle as needed for anaphylaxis.   losartan -hydrochlorothiazide (HYZAAR) 50-12.5 MG tablet, Take 1 tablet by mouth daily.   simvastatin  (ZOCOR ) 20 MG tablet, TAKE 1 TABLET(20 MG) BY  MOUTH AT BEDTIME   Current Outpatient Medications (Respiratory):    cetirizine (ZYRTEC) 10 MG tablet, Take 10 mg by mouth daily.   FLOVENT  HFA 44 MCG/ACT inhaler, Inhale 2 puffs into the lungs in the morning and at bedtime.   fluticasone  (FLONASE ) 50 MCG/ACT nasal spray, Place 2 sprays into both nostrils daily.   Current Outpatient Medications (Analgesics):    ibuprofen  (ADVIL ) 400 MG tablet,      Current Outpatient Medications (Other):    cholecalciferol (VITAMIN D ) 1000 UNITS tablet, Take 1,000 Units by mouth daily.   famotidine  (PEPCID ) 40 MG tablet, Take 1 tablet (40 mg total) by mouth at bedtime.   FLUoxetine  (PROZAC ) 20 MG capsule, Take 1 capsule (20 mg total) by mouth daily.   pantoprazole  (PROTONIX ) 40 MG tablet, Take 1 tablet (40 mg total) by mouth daily.   Medical History:      Past Medical History:  Diagnosis Date   Anxiety     Elevated cholesterol     Essential hypertension 12/25/2019   GERD (gastroesophageal reflux disease)      OCCASIONAL   Mass      RIGHT OVARY  --ABDOMINAL PAIN   Panic attacks     Sleep apnea     Syncope      PT STATES HER HEART AND BRAIN MISFIRE IF OVERLY HEATED OR UPSET--AND PT WOULD PASS OUT --NO EPISODES SINCE 2000  - NO MEDICATIONS--PT NO LONGER SEES CARDIOLOGIST        Allergies:  Allergies       Allergies  Allergen Reactions   Orange Oil Hives      Patient reports she can drink OJ without a problem.   Latex Hives and Other (See Comments)      CONTACT WITH LATEX CAUSES HIVES   Omeprazole  Other (See Comments)      Abdominal pain   Other        CAN'T EAT ORANGES-BUT CAN DRINK OJ   Phenobarbital Other (See Comments)      AS ACHILD--THOUGHT TO  BE HAVING SEIZURES--HAD ALLERGIC REACTION TO PHENOBARBITAL.   LATER TOLD SHE DID NOT HAVE SEIZURES        Surgical History:  She  has a past surgical history that includes Wisdom tooth extraction; Abdominal hysterectomy (07/2011); Colonoscopy (04/2019); and Biopsy thyroid . Family History:  Her family history includes Colon polyps in her mother; Diabetes in her mother; Hypertension in her father.   REVIEW OF SYSTEMS  : All other systems reviewed and negative except where noted in the History of Present Illness.   PHYSICAL EXAM: BP 132/84   Pulse 85   Ht 5' 4 (1.626 m)   Wt 226 lb 9.6 oz (102.8 kg)   LMP 06/04/2011   BMI 38.90 kg/m  Physical Exam   GENERAL APPEARANCE: Well nourished, in no apparent distress. HEENT: No cervical lymphadenopathy, unremarkable thyroid , sclerae anicteric, conjunctiva pink. RESPIRATORY: Respiratory effort normal, breath sounds equal bilaterally without rales, rhonchi, or wheezing. Lungs clear to auscultation. CARDIO: Regular rate and rhythm with no murmurs, rubs, or gallops, peripheral pulses intact. ABDOMEN: Soft, non-distended, active bowel sounds in all four quadrants, no tenderness to palpation, no rebound, no mass appreciated. Discomfort in one area, no pain elsewhere. RECTAL: Declines. MUSCULOSKELETAL: Full range of motion, normal gait, without edema. SKIN: Dry, intact without rashes or lesions. No jaundice. NEURO: Alert, oriented, no focal deficits. PSYCH: Cooperative, normal mood and affect.       Alan JONELLE Coombs, PA-C     Attending physician's  note   I have taken history, reviewed the chart and examined the patient. I performed a substantive portion of this encounter, including complete performance of at least one of the key components, in conjunction with the APP. I agree with the Advanced Practitioner's note, impression and recommendations.   For EGD today Neg Ba Swallow   Anselm Bring, MD [facility] [phone]

## 2023-10-24 NOTE — Patient Instructions (Addendum)
-  Await pathology results -Post dilation diet provided -Continue present medications. In 4 weeks stop taking Pepcid  at night and continue Protonix  every morning.   YOU HAD AN ENDOSCOPIC PROCEDURE TODAY AT THE [facility]:   Refer to the procedure report that was given to you for any specific questions about what was found during the examination.  If the procedure report does not answer your questions, please call your gastroenterologist to clarify.  If you requested that your care partner not be given the details of your procedure findings, then the procedure report has been included in a sealed envelope for you to review at your convenience later.  YOU SHOULD EXPECT: Some feelings of bloating in the abdomen. Passage of more gas than usual.  Walking can help get rid of the air that was put into your GI tract during the procedure and reduce the bloating. If you had a lower endoscopy (such as a colonoscopy or flexible sigmoidoscopy) you may notice spotting of blood in your stool or on the toilet paper. If you underwent a bowel prep for your procedure, you may not have a normal bowel movement for a few days.  Please Note:  You might notice some irritation and congestion in your nose or some drainage.  This is from the oxygen used during your procedure.  There is no need for concern and it should clear up in a day or so.  SYMPTOMS TO REPORT IMMEDIATELY:  Following upper endoscopy (EGD)  Vomiting of blood or coffee ground material  New chest pain or pain under the shoulder blades  Painful or persistently difficult swallowing  New shortness of breath  Fever of 100F or higher  Black, tarry-looking stools  For urgent or emergent issues, a gastroenterologist can be reached at any hour by calling [phone]. Do not use MyChart messaging for urgent concerns.    DIET:  We do recommend a dilation diet today (see handout). Tomorrow you may proceed to your regular diet.  Drink plenty of  fluids but you should avoid alcoholic beverages for 24 hours.  ACTIVITY:  You should plan to take it easy for the rest of today and you should NOT DRIVE or use heavy machinery until tomorrow (because of the sedation medicines used during the test).    FOLLOW UP: Our staff will call the number listed on your records the next business day following your procedure.  We will call around 7:15- 8:00 am to check on you and address any questions or concerns that you may have regarding the information given to you following your procedure. If we do not reach you, we will leave a message.     If any biopsies were taken you will be contacted by phone or by letter within the next 1-3 weeks.  Please call us  at [phone] if you have not heard about the biopsies in 3 weeks.    SIGNATURES/CONFIDENTIALITY: You and/or your care partner have signed paperwork which will be entered into your electronic medical record.  These signatures attest to the fact that that the information above on your After Visit Summary has been reviewed and is understood.  Full responsibility of the confidentiality of this discharge information lies with you and/or your care-partner.

## 2023-10-24 NOTE — Progress Notes (Signed)
 Called to room to assist during endoscopic procedure.  Patient ID and intended procedure confirmed with present staff. Received instructions for my participation in the procedure from the performing physician.

## 2023-10-24 NOTE — Progress Notes (Signed)
 Pt's states no medical or surgical changes since previsit or office visit.

## 2023-10-25 ENCOUNTER — Telehealth: Payer: Self-pay | Admitting: *Deleted

## 2023-10-25 NOTE — Telephone Encounter (Signed)
  Follow up Call-     10/24/2023    2:46 PM  Call back number  Post procedure Call Back phone  # [phone]  Permission to leave phone message Yes   Left message to call back if any questions or concerns

## 2023-10-30 LAB — SURGICAL PATHOLOGY

## 2023-11-05 ENCOUNTER — Ambulatory Visit: Payer: Self-pay | Admitting: Gastroenterology

## 2023-11-06 ENCOUNTER — Ambulatory Visit

## 2023-11-06 DIAGNOSIS — Z23 Encounter for immunization: Secondary | ICD-10-CM

## 2023-11-06 NOTE — Progress Notes (Signed)
 Patient here today for influenza vaccine , patient tolerated immunization well

## 2023-11-23 ENCOUNTER — Encounter: Payer: Self-pay | Admitting: Family Medicine

## 2023-11-28 ENCOUNTER — Encounter: Payer: Self-pay | Admitting: Cardiology

## 2023-11-28 ENCOUNTER — Ambulatory Visit: Attending: Cardiology | Admitting: Cardiology

## 2023-11-28 VITALS — BP 132/81 | HR 72 | Ht 64.0 in | Wt 225.0 lb

## 2023-11-28 DIAGNOSIS — E782 Mixed hyperlipidemia: Secondary | ICD-10-CM | POA: Diagnosis not present

## 2023-11-28 DIAGNOSIS — R55 Syncope and collapse: Secondary | ICD-10-CM | POA: Diagnosis not present

## 2023-11-28 NOTE — Progress Notes (Signed)
  Cardiology Office Note:  .   Date:  11/28/2023  ID:  Margaret Davis [place], DOB [birth year], MRN [id number] PCP: Margaret Harlene BROCKS, MD  [facility] Providers Cardiologist:  Margaret Lawrence, MD PCP: Margaret Harlene BROCKS, MD  Chief Complaint  Patient presents with   Loss of Consciousness     Margaret Davis is a 53 y.o. female with hypertension, OSA on CPAP, vasovagal syncope   History of Present Illness Patient has not had any recurrent syncope or presyncope since her last visit.  Blood pressure is well-controlled.  Physical activity is down currently, but she is trying to improve increase that.      Vitals:   11/28/23 0849  BP: 132/81  Pulse: 72  SpO2: 99%       Review of Systems  Cardiovascular:  Negative for chest pain, dyspnea on exertion, leg swelling, palpitations and syncope.        Studies Reviewed: SABRA        EKG 07/17/2023: Sinus rhythm 84 bpm Borderline LVH  Zio monitor 14 days 08/2023: HR 50 to 127, average 74. 1 nonsustained VT lasting 6 beats Rare supraventricular and ventricular ectopy. No sustained arrhythmias. No atrial fibrillation.   Labs 06/2023: Chol 214, TG 79, HDL 76, LDL 122 HbA1C 5.6% Hb 12.5 Cr 0.76 TSH 1.1   Physical Exam Vitals and nursing note reviewed.  Constitutional:      General: She is not in acute distress. Neck:     Vascular: No JVD.  Cardiovascular:     Rate and Rhythm: Normal rate and regular rhythm.     Heart sounds: Normal heart sounds. No murmur heard. Pulmonary:     Effort: Pulmonary effort is normal.     Breath sounds: Normal breath sounds. No wheezing or rales.  Musculoskeletal:     Right lower leg: No edema.     Left lower leg: No edema.      VISIT DIAGNOSES:   ICD-10-CM   1. Mixed hyperlipidemia  E78.2 Lipid panel    CT CARDIAC SCORING (SELF PAY ONLY)    Lipid panel    2. Vasovagal syncope  R55         Margaret Davis is a 53 y.o. female with hypertension, OSA on CPAP,  vasovagal syncope  Assessment & Plan Vasovagal syncope: Intermittent episodes, stress and heat as triggers. No recent recurrence. Recommend liberal hydration, consider compression stockings.  Encouraged caution while driving, advised pulling over if lightheaded.    Hypertension: Blood pressure slightly elevated. No new an currently on simvastatin .  Calcium score scan 0 in 2023. Repeat lipid panel and calcium score scan in 1 year.         F/u in 1 year  Signed, Margaret [name], MD

## 2023-11-28 NOTE — Patient Instructions (Signed)
  Lab Work: Iipid panel in 1 year   If you have labs (blood work) drawn today and your tests are completely normal, you will receive your results only by: [employer] (if you have MyChart) OR A paper copy in the mail If you have any lab test that is abnormal or we need to change your treatment, we will call you to review the results.  Testing/Procedures: Calcium score in 1 year   CT scanning for a cardiac calcium score (CAT scanning), is a noninvasive, special x-ray that produces cross-sectional images of the body using x-rays and a computer. CT scans help physicians diagnose and treat medical conditions. For some CT exams, a contrast material is used to enhance visibility in the area of the body being studied. CT scans provide greater clarity and reveal more details than regular x-ray exams. Your physician has requested that you have a coronary calcium score performed. This is not covered by insurance and will be an out-of-pocket cost of approximately $99.   Follow-Up: At [facility], you and your health needs are our priority.  As part of our continuing mission to provide you with exceptional heart care, our providers are all part of one team.  This team includes your primary Cardiologist (physician) and Advanced Practice Providers or APPs (Physician Assistants and Nurse Practitioners) who all work together to provide you with the care you need, when you need it.  Your next appointment:   1 year(s)  Provider:   Newman [name], MD

## 2023-11-29 ENCOUNTER — Ambulatory Visit: Admitting: Cardiology

## 2023-12-14 ENCOUNTER — Other Ambulatory Visit: Payer: Self-pay | Admitting: Physician Assistant

## 2023-12-18 ENCOUNTER — Encounter: Payer: Self-pay | Admitting: Family Medicine

## 2024-05-02 ENCOUNTER — Ambulatory Visit: Admitting: Neurology

## 2024-11-25 ENCOUNTER — Other Ambulatory Visit (HOSPITAL_COMMUNITY)
